# Patient Record
Sex: Female | Born: 1935 | Race: White | Hispanic: No | Smoking: Current every day smoker
Health system: Southern US, Community
[De-identification: ages and names within clinical notes are randomized; demographics above are authoritative.]

## PROBLEM LIST (undated history)

## (undated) DIAGNOSIS — J449 Chronic obstructive pulmonary disease, unspecified: Secondary | ICD-10-CM

## (undated) DIAGNOSIS — D529 Folate deficiency anemia, unspecified: Secondary | ICD-10-CM

## (undated) DIAGNOSIS — J383 Other diseases of vocal cords: Secondary | ICD-10-CM

## (undated) DIAGNOSIS — Z9981 Dependence on supplemental oxygen: Secondary | ICD-10-CM

## (undated) DIAGNOSIS — I6529 Occlusion and stenosis of unspecified carotid artery: Secondary | ICD-10-CM

## (undated) DIAGNOSIS — F410 Panic disorder [episodic paroxysmal anxiety] without agoraphobia: Secondary | ICD-10-CM

## (undated) DIAGNOSIS — G63 Polyneuropathy in diseases classified elsewhere: Secondary | ICD-10-CM

## (undated) DIAGNOSIS — N289 Disorder of kidney and ureter, unspecified: Secondary | ICD-10-CM

## (undated) DIAGNOSIS — I10 Essential (primary) hypertension: Secondary | ICD-10-CM

## (undated) DIAGNOSIS — G40909 Epilepsy, unspecified, not intractable, without status epilepticus: Secondary | ICD-10-CM

## (undated) DIAGNOSIS — K635 Polyp of colon: Secondary | ICD-10-CM

## (undated) DIAGNOSIS — S32501A Unspecified fracture of right pubis, initial encounter for closed fracture: Secondary | ICD-10-CM

## (undated) DIAGNOSIS — K219 Gastro-esophageal reflux disease without esophagitis: Secondary | ICD-10-CM

## (undated) DIAGNOSIS — N63 Unspecified lump in unspecified breast: Secondary | ICD-10-CM

## (undated) DIAGNOSIS — L259 Unspecified contact dermatitis, unspecified cause: Secondary | ICD-10-CM

## (undated) DIAGNOSIS — R7402 Elevation of levels of lactic acid dehydrogenase (LDH): Secondary | ICD-10-CM

## (undated) DIAGNOSIS — R740 Nonspecific elevation of levels of transaminase and lactic acid dehydrogenase [LDH]: Secondary | ICD-10-CM

## (undated) DIAGNOSIS — R7401 Elevation of levels of liver transaminase levels: Secondary | ICD-10-CM

## (undated) DIAGNOSIS — E785 Hyperlipidemia, unspecified: Secondary | ICD-10-CM

## (undated) HISTORY — DX: Chronic obstructive pulmonary disease, unspecified: J44.9

## (undated) HISTORY — DX: Occlusion and stenosis of unspecified carotid artery: I65.29

## (undated) HISTORY — DX: Unspecified lump in unspecified breast: N63.0

## (undated) HISTORY — DX: Unspecified contact dermatitis, unspecified cause: L25.9

## (undated) HISTORY — DX: Disorder of kidney and ureter, unspecified: N28.9

## (undated) HISTORY — DX: Panic disorder (episodic paroxysmal anxiety): F41.0

## (undated) HISTORY — PX: TUBAL LIGATION: SHX77

## (undated) HISTORY — DX: Essential (primary) hypertension: I10

## (undated) HISTORY — DX: Polyp of colon: K63.5

## (undated) HISTORY — PX: APPENDECTOMY: SHX54

## (undated) HISTORY — DX: Nonspecific elevation of levels of transaminase and lactic acid dehydrogenase (ldh): R74.0

## (undated) HISTORY — DX: Elevation of levels of lactic acid dehydrogenase (LDH): R74.02

## (undated) HISTORY — PX: COLONOSCOPY: SHX174

## (undated) HISTORY — DX: Hyperlipidemia, unspecified: E78.5

## (undated) HISTORY — DX: Polyneuropathy in diseases classified elsewhere: G63

## (undated) HISTORY — PX: BREAST BIOPSY: SHX20

## (undated) HISTORY — DX: Elevation of levels of liver transaminase levels: R74.01

## (undated) HISTORY — DX: Dependence on supplemental oxygen: Z99.81

## (undated) HISTORY — DX: Gastro-esophageal reflux disease without esophagitis: K21.9

## (undated) HISTORY — PX: UPPER GASTROINTESTINAL ENDOSCOPY: SHX188

## (undated) HISTORY — DX: Epilepsy, unspecified, not intractable, without status epilepticus: G40.909

## (undated) HISTORY — DX: Unspecified fracture of right pubis, initial encounter for closed fracture: S32.501A

## (undated) HISTORY — DX: Other diseases of vocal cords: J38.3

## (undated) HISTORY — DX: Folate deficiency anemia, unspecified: D52.9

## (undated) HISTORY — PX: CHOLECYSTECTOMY: SHX55

---

## 1996-01-12 DIAGNOSIS — J449 Chronic obstructive pulmonary disease, unspecified: Secondary | ICD-10-CM

## 1997-11-12 ENCOUNTER — Encounter: Payer: Self-pay | Admitting: Family Medicine

## 1997-11-12 ENCOUNTER — Ambulatory Visit (HOSPITAL_COMMUNITY): Admission: RE | Admit: 1997-11-12 | Discharge: 1997-11-12 | Payer: Self-pay | Admitting: Family Medicine

## 1999-01-23 ENCOUNTER — Encounter: Payer: Self-pay | Admitting: Family Medicine

## 1999-01-23 ENCOUNTER — Ambulatory Visit (HOSPITAL_COMMUNITY): Admission: RE | Admit: 1999-01-23 | Discharge: 1999-01-23 | Payer: Self-pay | Admitting: Family Medicine

## 1999-01-25 ENCOUNTER — Ambulatory Visit (HOSPITAL_COMMUNITY): Admission: RE | Admit: 1999-01-25 | Discharge: 1999-01-25 | Payer: Self-pay | Admitting: Family Medicine

## 1999-01-25 ENCOUNTER — Encounter: Payer: Self-pay | Admitting: Family Medicine

## 1999-03-20 ENCOUNTER — Emergency Department (HOSPITAL_COMMUNITY): Admission: EM | Admit: 1999-03-20 | Discharge: 1999-03-21 | Payer: Self-pay | Admitting: *Deleted

## 1999-03-26 ENCOUNTER — Ambulatory Visit (HOSPITAL_COMMUNITY): Admission: RE | Admit: 1999-03-26 | Discharge: 1999-03-26 | Payer: Self-pay | Admitting: Pediatrics

## 1999-03-31 ENCOUNTER — Ambulatory Visit (HOSPITAL_COMMUNITY): Admission: RE | Admit: 1999-03-31 | Discharge: 1999-03-31 | Payer: Self-pay | Admitting: Pediatrics

## 1999-03-31 ENCOUNTER — Encounter: Payer: Self-pay | Admitting: Pediatrics

## 1999-04-01 ENCOUNTER — Encounter: Payer: Self-pay | Admitting: Emergency Medicine

## 1999-04-01 ENCOUNTER — Emergency Department (HOSPITAL_COMMUNITY): Admission: EM | Admit: 1999-04-01 | Discharge: 1999-04-01 | Payer: Self-pay | Admitting: Emergency Medicine

## 1999-04-02 ENCOUNTER — Ambulatory Visit (HOSPITAL_COMMUNITY): Admission: RE | Admit: 1999-04-02 | Discharge: 1999-04-02 | Payer: Self-pay | Admitting: Pediatrics

## 1999-07-31 ENCOUNTER — Other Ambulatory Visit: Admission: RE | Admit: 1999-07-31 | Discharge: 1999-07-31 | Payer: Self-pay | Admitting: Family Medicine

## 2000-06-11 DIAGNOSIS — M810 Age-related osteoporosis without current pathological fracture: Secondary | ICD-10-CM | POA: Insufficient documentation

## 2000-06-14 ENCOUNTER — Encounter: Admission: RE | Admit: 2000-06-14 | Discharge: 2000-06-14 | Payer: Self-pay | Admitting: Family Medicine

## 2000-06-14 ENCOUNTER — Encounter: Payer: Self-pay | Admitting: Family Medicine

## 2002-07-18 ENCOUNTER — Other Ambulatory Visit: Admission: RE | Admit: 2002-07-18 | Discharge: 2002-07-18 | Payer: Self-pay | Admitting: Family Medicine

## 2003-03-19 ENCOUNTER — Ambulatory Visit (HOSPITAL_COMMUNITY): Admission: RE | Admit: 2003-03-19 | Discharge: 2003-03-19 | Payer: Self-pay | Admitting: Family Medicine

## 2003-06-06 ENCOUNTER — Encounter (INDEPENDENT_AMBULATORY_CARE_PROVIDER_SITE_OTHER): Payer: Self-pay | Admitting: Family Medicine

## 2003-08-14 ENCOUNTER — Other Ambulatory Visit: Admission: RE | Admit: 2003-08-14 | Discharge: 2003-08-14 | Payer: Self-pay | Admitting: Family Medicine

## 2003-11-07 ENCOUNTER — Ambulatory Visit: Payer: Self-pay | Admitting: Internal Medicine

## 2003-11-11 ENCOUNTER — Ambulatory Visit (HOSPITAL_COMMUNITY): Admission: RE | Admit: 2003-11-11 | Discharge: 2003-11-11 | Payer: Self-pay | Admitting: Internal Medicine

## 2003-11-14 ENCOUNTER — Ambulatory Visit: Payer: Self-pay | Admitting: Internal Medicine

## 2003-11-14 ENCOUNTER — Ambulatory Visit: Payer: Self-pay | Admitting: Critical Care Medicine

## 2003-11-21 ENCOUNTER — Ambulatory Visit: Payer: Self-pay | Admitting: Family Medicine

## 2003-11-27 ENCOUNTER — Ambulatory Visit: Payer: Self-pay | Admitting: Family Medicine

## 2003-12-03 ENCOUNTER — Encounter (INDEPENDENT_AMBULATORY_CARE_PROVIDER_SITE_OTHER): Payer: Self-pay | Admitting: *Deleted

## 2003-12-03 ENCOUNTER — Ambulatory Visit (HOSPITAL_COMMUNITY): Admission: RE | Admit: 2003-12-03 | Discharge: 2003-12-04 | Payer: Self-pay | Admitting: General Surgery

## 2004-04-10 ENCOUNTER — Ambulatory Visit: Payer: Self-pay | Admitting: Critical Care Medicine

## 2004-04-28 ENCOUNTER — Ambulatory Visit: Payer: Self-pay | Admitting: Critical Care Medicine

## 2004-04-30 ENCOUNTER — Ambulatory Visit: Payer: Self-pay | Admitting: Internal Medicine

## 2004-07-09 ENCOUNTER — Ambulatory Visit: Payer: Self-pay | Admitting: Critical Care Medicine

## 2004-10-06 ENCOUNTER — Other Ambulatory Visit: Admission: RE | Admit: 2004-10-06 | Discharge: 2004-10-06 | Payer: Self-pay | Admitting: Family Medicine

## 2004-10-06 ENCOUNTER — Ambulatory Visit: Payer: Self-pay | Admitting: Family Medicine

## 2004-10-21 ENCOUNTER — Encounter: Admission: RE | Admit: 2004-10-21 | Discharge: 2004-10-21 | Payer: Self-pay | Admitting: Family Medicine

## 2004-10-23 ENCOUNTER — Ambulatory Visit: Payer: Self-pay | Admitting: Family Medicine

## 2004-10-28 ENCOUNTER — Ambulatory Visit: Payer: Self-pay | Admitting: Critical Care Medicine

## 2004-11-09 ENCOUNTER — Ambulatory Visit: Payer: Self-pay | Admitting: Family Medicine

## 2004-11-17 ENCOUNTER — Ambulatory Visit: Payer: Self-pay | Admitting: Family Medicine

## 2004-12-08 ENCOUNTER — Ambulatory Visit: Payer: Self-pay | Admitting: Family Medicine

## 2005-01-26 ENCOUNTER — Ambulatory Visit: Payer: Self-pay | Admitting: Family Medicine

## 2005-02-04 ENCOUNTER — Ambulatory Visit (HOSPITAL_COMMUNITY): Admission: RE | Admit: 2005-02-04 | Discharge: 2005-02-04 | Payer: Self-pay | Admitting: Family Medicine

## 2005-02-19 ENCOUNTER — Ambulatory Visit: Payer: Self-pay | Admitting: Internal Medicine

## 2005-03-05 ENCOUNTER — Ambulatory Visit: Payer: Self-pay | Admitting: Internal Medicine

## 2005-03-05 ENCOUNTER — Ambulatory Visit (HOSPITAL_COMMUNITY): Admission: RE | Admit: 2005-03-05 | Discharge: 2005-03-05 | Payer: Self-pay | Admitting: Internal Medicine

## 2005-03-05 ENCOUNTER — Encounter (INDEPENDENT_AMBULATORY_CARE_PROVIDER_SITE_OTHER): Payer: Self-pay | Admitting: Specialist

## 2005-03-09 ENCOUNTER — Ambulatory Visit: Payer: Self-pay | Admitting: Critical Care Medicine

## 2005-04-29 ENCOUNTER — Ambulatory Visit: Payer: Self-pay | Admitting: Internal Medicine

## 2005-05-04 ENCOUNTER — Ambulatory Visit: Payer: Self-pay | Admitting: Gastroenterology

## 2005-05-27 ENCOUNTER — Ambulatory Visit: Payer: Self-pay | Admitting: Internal Medicine

## 2005-06-10 ENCOUNTER — Encounter: Payer: Self-pay | Admitting: Internal Medicine

## 2005-06-10 ENCOUNTER — Ambulatory Visit: Payer: Self-pay | Admitting: Internal Medicine

## 2005-06-21 ENCOUNTER — Ambulatory Visit: Payer: Self-pay | Admitting: Family Medicine

## 2005-07-01 ENCOUNTER — Ambulatory Visit: Payer: Self-pay | Admitting: Internal Medicine

## 2005-07-01 ENCOUNTER — Ambulatory Visit: Payer: Self-pay | Admitting: Critical Care Medicine

## 2005-10-04 ENCOUNTER — Encounter (INDEPENDENT_AMBULATORY_CARE_PROVIDER_SITE_OTHER): Payer: Self-pay | Admitting: Family Medicine

## 2005-10-04 ENCOUNTER — Other Ambulatory Visit: Admission: RE | Admit: 2005-10-04 | Discharge: 2005-10-04 | Payer: Self-pay | Admitting: *Deleted

## 2005-10-04 ENCOUNTER — Ambulatory Visit: Payer: Self-pay | Admitting: Internal Medicine

## 2005-10-04 LAB — CONVERTED CEMR LAB: Pap Smear: NORMAL

## 2005-11-09 ENCOUNTER — Ambulatory Visit (HOSPITAL_COMMUNITY): Admission: RE | Admit: 2005-11-09 | Discharge: 2005-11-09 | Payer: Self-pay | Admitting: Critical Care Medicine

## 2005-11-09 ENCOUNTER — Ambulatory Visit: Payer: Self-pay | Admitting: Critical Care Medicine

## 2006-03-15 ENCOUNTER — Ambulatory Visit: Payer: Self-pay | Admitting: Family Medicine

## 2006-03-24 ENCOUNTER — Encounter (INDEPENDENT_AMBULATORY_CARE_PROVIDER_SITE_OTHER): Payer: Self-pay | Admitting: Family Medicine

## 2006-06-14 ENCOUNTER — Ambulatory Visit: Payer: Self-pay | Admitting: Family Medicine

## 2006-07-19 ENCOUNTER — Ambulatory Visit: Payer: Self-pay | Admitting: Critical Care Medicine

## 2006-07-19 ENCOUNTER — Ambulatory Visit: Payer: Self-pay | Admitting: Family Medicine

## 2006-08-16 ENCOUNTER — Ambulatory Visit: Payer: Self-pay | Admitting: Family Medicine

## 2006-08-16 LAB — CONVERTED CEMR LAB
Basophils Absolute: 0.1 10*3/uL (ref 0.0–0.1)
Basophils Relative: 1 % (ref 0–1)
Eosinophils Absolute: 0.1 10*3/uL (ref 0.0–0.7)
Eosinophils Relative: 1 % (ref 0–5)
Folate: 3.7 ng/mL
HCT: 45.3 % (ref 36.0–46.0)
Hemoglobin: 14.3 g/dL (ref 12.0–15.0)
Lymphocytes Relative: 24 % (ref 12–46)
Lymphs Abs: 3 10*3/uL (ref 0.7–3.3)
MCHC: 31.6 g/dL (ref 30.0–36.0)
MCV: 101.1 fL — ABNORMAL HIGH (ref 78.0–100.0)
Monocytes Absolute: 0.7 10*3/uL (ref 0.2–0.7)
Monocytes Relative: 6 % (ref 3–11)
Neutro Abs: 8.4 10*3/uL — ABNORMAL HIGH (ref 1.7–7.7)
Neutrophils Relative %: 69 % (ref 43–77)
Platelets: 286 10*3/uL (ref 150–400)
RBC: 4.48 M/uL (ref 3.87–5.11)
RDW: 15 % — ABNORMAL HIGH (ref 11.5–14.0)
Vitamin B-12: 493 pg/mL (ref 211–911)
WBC: 12.2 10*3/uL — ABNORMAL HIGH (ref 4.0–10.5)

## 2006-08-22 DIAGNOSIS — D529 Folate deficiency anemia, unspecified: Secondary | ICD-10-CM | POA: Insufficient documentation

## 2006-09-01 ENCOUNTER — Encounter (INDEPENDENT_AMBULATORY_CARE_PROVIDER_SITE_OTHER): Payer: Self-pay | Admitting: Family Medicine

## 2006-09-01 DIAGNOSIS — F410 Panic disorder [episodic paroxysmal anxiety] without agoraphobia: Secondary | ICD-10-CM | POA: Insufficient documentation

## 2006-09-01 DIAGNOSIS — J383 Other diseases of vocal cords: Secondary | ICD-10-CM

## 2006-09-01 DIAGNOSIS — R569 Unspecified convulsions: Secondary | ICD-10-CM

## 2006-09-01 DIAGNOSIS — F4310 Post-traumatic stress disorder, unspecified: Secondary | ICD-10-CM

## 2006-09-01 DIAGNOSIS — F17200 Nicotine dependence, unspecified, uncomplicated: Secondary | ICD-10-CM | POA: Insufficient documentation

## 2006-10-04 ENCOUNTER — Telehealth (INDEPENDENT_AMBULATORY_CARE_PROVIDER_SITE_OTHER): Payer: Self-pay | Admitting: *Deleted

## 2006-10-10 ENCOUNTER — Telehealth (INDEPENDENT_AMBULATORY_CARE_PROVIDER_SITE_OTHER): Payer: Self-pay | Admitting: *Deleted

## 2006-10-19 ENCOUNTER — Telehealth (INDEPENDENT_AMBULATORY_CARE_PROVIDER_SITE_OTHER): Payer: Self-pay | Admitting: *Deleted

## 2006-10-21 ENCOUNTER — Telehealth (INDEPENDENT_AMBULATORY_CARE_PROVIDER_SITE_OTHER): Payer: Self-pay | Admitting: Internal Medicine

## 2006-10-27 ENCOUNTER — Ambulatory Visit: Payer: Self-pay | Admitting: Pulmonary Disease

## 2006-10-27 DIAGNOSIS — K219 Gastro-esophageal reflux disease without esophagitis: Secondary | ICD-10-CM | POA: Insufficient documentation

## 2006-11-07 ENCOUNTER — Telehealth (INDEPENDENT_AMBULATORY_CARE_PROVIDER_SITE_OTHER): Payer: Self-pay | Admitting: *Deleted

## 2007-02-21 ENCOUNTER — Ambulatory Visit: Payer: Self-pay | Admitting: Critical Care Medicine

## 2007-02-21 ENCOUNTER — Ambulatory Visit: Payer: Self-pay | Admitting: Pulmonary Disease

## 2007-03-06 ENCOUNTER — Ambulatory Visit: Payer: Self-pay | Admitting: Family Medicine

## 2007-03-06 LAB — CONVERTED CEMR LAB
ALT: 15 units/L (ref 0–35)
AST: 20 units/L (ref 0–37)
Albumin: 3.8 g/dL (ref 3.5–5.2)
Alkaline Phosphatase: 109 units/L (ref 39–117)
BUN: 11 mg/dL (ref 6–23)
CO2: 24 meq/L (ref 19–32)
Calcium: 10 mg/dL (ref 8.4–10.5)
Chloride: 104 meq/L (ref 96–112)
Cholesterol: 177 mg/dL (ref 0–200)
Creatinine, Ser: 1.05 mg/dL (ref 0.40–1.20)
Glucose, Bld: 99 mg/dL (ref 70–99)
HDL: 50 mg/dL (ref >39)
LDL Cholesterol: 102 mg/dL — ABNORMAL HIGH (ref 0–99)
Potassium: 5.4 meq/L — ABNORMAL HIGH (ref 3.5–5.3)
Sodium: 145 meq/L (ref 135–145)
TSH: 2.009 microintl units/mL (ref 0.350–5.50)
Total Bilirubin: 0.9 mg/dL (ref 0.3–1.2)
Total CHOL/HDL Ratio: 3.5
Total Protein: 7.1 g/dL (ref 6.0–8.3)
Triglycerides: 123 mg/dL (ref <150)
VLDL: 25 mg/dL (ref 0–40)

## 2007-03-08 ENCOUNTER — Telehealth (INDEPENDENT_AMBULATORY_CARE_PROVIDER_SITE_OTHER): Payer: Self-pay | Admitting: Family Medicine

## 2007-03-20 ENCOUNTER — Telehealth (INDEPENDENT_AMBULATORY_CARE_PROVIDER_SITE_OTHER): Payer: Self-pay | Admitting: *Deleted

## 2007-04-03 ENCOUNTER — Telehealth (INDEPENDENT_AMBULATORY_CARE_PROVIDER_SITE_OTHER): Payer: Self-pay | Admitting: Family Medicine

## 2007-04-13 ENCOUNTER — Encounter (INDEPENDENT_AMBULATORY_CARE_PROVIDER_SITE_OTHER): Payer: Self-pay | Admitting: Family Medicine

## 2007-04-18 ENCOUNTER — Ambulatory Visit: Payer: Self-pay | Admitting: Family Medicine

## 2007-04-19 ENCOUNTER — Telehealth (INDEPENDENT_AMBULATORY_CARE_PROVIDER_SITE_OTHER): Payer: Self-pay | Admitting: Family Medicine

## 2007-04-20 ENCOUNTER — Encounter (INDEPENDENT_AMBULATORY_CARE_PROVIDER_SITE_OTHER): Payer: Self-pay | Admitting: Nurse Practitioner

## 2007-04-24 ENCOUNTER — Telehealth (INDEPENDENT_AMBULATORY_CARE_PROVIDER_SITE_OTHER): Payer: Self-pay | Admitting: Family Medicine

## 2007-05-29 ENCOUNTER — Telehealth (INDEPENDENT_AMBULATORY_CARE_PROVIDER_SITE_OTHER): Payer: Self-pay | Admitting: Nurse Practitioner

## 2007-05-29 ENCOUNTER — Telehealth (INDEPENDENT_AMBULATORY_CARE_PROVIDER_SITE_OTHER): Payer: Self-pay | Admitting: *Deleted

## 2007-05-31 ENCOUNTER — Telehealth (INDEPENDENT_AMBULATORY_CARE_PROVIDER_SITE_OTHER): Payer: Self-pay | Admitting: Family Medicine

## 2007-06-14 ENCOUNTER — Telehealth (INDEPENDENT_AMBULATORY_CARE_PROVIDER_SITE_OTHER): Payer: Self-pay | Admitting: Family Medicine

## 2007-06-19 ENCOUNTER — Ambulatory Visit: Payer: Self-pay | Admitting: Family Medicine

## 2007-07-13 ENCOUNTER — Telehealth (INDEPENDENT_AMBULATORY_CARE_PROVIDER_SITE_OTHER): Payer: Self-pay | Admitting: Family Medicine

## 2007-07-31 ENCOUNTER — Ambulatory Visit: Payer: Self-pay | Admitting: Family Medicine

## 2007-07-31 ENCOUNTER — Encounter (INDEPENDENT_AMBULATORY_CARE_PROVIDER_SITE_OTHER): Payer: Self-pay | Admitting: Family Medicine

## 2007-07-31 DIAGNOSIS — N63 Unspecified lump in unspecified breast: Secondary | ICD-10-CM | POA: Insufficient documentation

## 2007-08-01 LAB — CONVERTED CEMR LAB: Pap Smear: NORMAL

## 2007-08-04 ENCOUNTER — Encounter: Admission: RE | Admit: 2007-08-04 | Discharge: 2007-08-04 | Payer: Self-pay | Admitting: Family Medicine

## 2007-08-07 ENCOUNTER — Telehealth (INDEPENDENT_AMBULATORY_CARE_PROVIDER_SITE_OTHER): Payer: Self-pay | Admitting: *Deleted

## 2007-08-08 ENCOUNTER — Encounter (INDEPENDENT_AMBULATORY_CARE_PROVIDER_SITE_OTHER): Payer: Self-pay | Admitting: Family Medicine

## 2007-08-10 ENCOUNTER — Encounter (INDEPENDENT_AMBULATORY_CARE_PROVIDER_SITE_OTHER): Payer: Self-pay | Admitting: Family Medicine

## 2007-08-17 ENCOUNTER — Telehealth (INDEPENDENT_AMBULATORY_CARE_PROVIDER_SITE_OTHER): Payer: Self-pay | Admitting: Family Medicine

## 2007-08-23 ENCOUNTER — Ambulatory Visit: Payer: Self-pay | Admitting: Critical Care Medicine

## 2007-08-25 ENCOUNTER — Encounter (INDEPENDENT_AMBULATORY_CARE_PROVIDER_SITE_OTHER): Payer: Self-pay | Admitting: Family Medicine

## 2007-09-01 ENCOUNTER — Telehealth (INDEPENDENT_AMBULATORY_CARE_PROVIDER_SITE_OTHER): Payer: Self-pay | Admitting: Family Medicine

## 2007-09-12 ENCOUNTER — Telehealth (INDEPENDENT_AMBULATORY_CARE_PROVIDER_SITE_OTHER): Payer: Self-pay | Admitting: *Deleted

## 2007-09-25 ENCOUNTER — Ambulatory Visit: Payer: Self-pay | Admitting: Family Medicine

## 2007-09-25 DIAGNOSIS — G63 Polyneuropathy in diseases classified elsewhere: Secondary | ICD-10-CM

## 2007-09-28 ENCOUNTER — Ambulatory Visit: Payer: Self-pay | Admitting: Critical Care Medicine

## 2007-09-29 ENCOUNTER — Telehealth (INDEPENDENT_AMBULATORY_CARE_PROVIDER_SITE_OTHER): Payer: Self-pay | Admitting: *Deleted

## 2007-10-12 ENCOUNTER — Encounter (INDEPENDENT_AMBULATORY_CARE_PROVIDER_SITE_OTHER): Payer: Self-pay | Admitting: Family Medicine

## 2007-12-11 ENCOUNTER — Ambulatory Visit: Payer: Self-pay | Admitting: Critical Care Medicine

## 2007-12-15 ENCOUNTER — Telehealth (INDEPENDENT_AMBULATORY_CARE_PROVIDER_SITE_OTHER): Payer: Self-pay | Admitting: *Deleted

## 2007-12-20 ENCOUNTER — Encounter (INDEPENDENT_AMBULATORY_CARE_PROVIDER_SITE_OTHER): Payer: Self-pay | Admitting: Family Medicine

## 2007-12-21 ENCOUNTER — Telehealth (INDEPENDENT_AMBULATORY_CARE_PROVIDER_SITE_OTHER): Payer: Self-pay | Admitting: *Deleted

## 2007-12-22 ENCOUNTER — Telehealth (INDEPENDENT_AMBULATORY_CARE_PROVIDER_SITE_OTHER): Payer: Self-pay | Admitting: *Deleted

## 2007-12-25 ENCOUNTER — Ambulatory Visit: Payer: Self-pay | Admitting: Family Medicine

## 2007-12-26 ENCOUNTER — Encounter (INDEPENDENT_AMBULATORY_CARE_PROVIDER_SITE_OTHER): Payer: Self-pay | Admitting: Family Medicine

## 2007-12-26 ENCOUNTER — Ambulatory Visit: Payer: Self-pay | Admitting: Critical Care Medicine

## 2008-01-03 ENCOUNTER — Encounter: Payer: Self-pay | Admitting: Critical Care Medicine

## 2008-01-03 LAB — CONVERTED CEMR LAB
RBC Folate: 1614 ng/mL — ABNORMAL HIGH (ref 180–600)
Vitamin B-12: 928 pg/mL — ABNORMAL HIGH (ref 211–911)

## 2008-02-28 ENCOUNTER — Encounter (INDEPENDENT_AMBULATORY_CARE_PROVIDER_SITE_OTHER): Payer: Self-pay | Admitting: Family Medicine

## 2008-03-15 ENCOUNTER — Ambulatory Visit: Payer: Self-pay | Admitting: Critical Care Medicine

## 2008-03-15 ENCOUNTER — Telehealth: Payer: Self-pay | Admitting: Critical Care Medicine

## 2008-04-15 ENCOUNTER — Telehealth (INDEPENDENT_AMBULATORY_CARE_PROVIDER_SITE_OTHER): Payer: Self-pay | Admitting: *Deleted

## 2008-04-16 ENCOUNTER — Encounter (INDEPENDENT_AMBULATORY_CARE_PROVIDER_SITE_OTHER): Payer: Self-pay | Admitting: Family Medicine

## 2008-04-25 ENCOUNTER — Telehealth (INDEPENDENT_AMBULATORY_CARE_PROVIDER_SITE_OTHER): Payer: Self-pay | Admitting: *Deleted

## 2008-05-22 ENCOUNTER — Telehealth (INDEPENDENT_AMBULATORY_CARE_PROVIDER_SITE_OTHER): Payer: Self-pay | Admitting: Family Medicine

## 2008-05-30 ENCOUNTER — Ambulatory Visit: Payer: Self-pay | Admitting: Family Medicine

## 2008-06-04 LAB — CONVERTED CEMR LAB
ALT: 11 units/L (ref 0–35)
AST: 20 units/L (ref 0–37)
Albumin: 4.1 g/dL (ref 3.5–5.2)
Alkaline Phosphatase: 74 units/L (ref 39–117)
BUN: 14 mg/dL (ref 6–23)
Basophils Absolute: 0.1 10*3/uL (ref 0.0–0.1)
Basophils Relative: 1 % (ref 0–1)
CO2: 26 meq/L (ref 19–32)
Calcium: 10.1 mg/dL (ref 8.4–10.5)
Chloride: 104 meq/L (ref 96–112)
Cholesterol: 191 mg/dL (ref 0–200)
Creatinine, Ser: 1.17 mg/dL (ref 0.40–1.20)
Eosinophils Absolute: 0.2 10*3/uL (ref 0.0–0.7)
Eosinophils Relative: 2 % (ref 0–5)
Glucose, Bld: 79 mg/dL (ref 70–99)
HCT: 44 % (ref 36.0–46.0)
HDL: 52 mg/dL (ref >39)
Hemoglobin: 14.9 g/dL (ref 12.0–15.0)
LDL Cholesterol: 116 mg/dL — ABNORMAL HIGH (ref 0–99)
Lymphocytes Relative: 27 % (ref 12–46)
Lymphs Abs: 3.2 10*3/uL (ref 0.7–4.0)
MCHC: 33.9 g/dL (ref 30.0–36.0)
MCV: 97.3 fL (ref 78.0–100.0)
Monocytes Absolute: 0.9 10*3/uL (ref 0.1–1.0)
Monocytes Relative: 8 % (ref 3–12)
Neutro Abs: 7.6 10*3/uL (ref 1.7–7.7)
Neutrophils Relative %: 63 % (ref 43–77)
Platelets: 254 10*3/uL (ref 150–400)
Potassium: 4.8 meq/L (ref 3.5–5.3)
RBC: 4.52 M/uL (ref 3.87–5.11)
RDW: 14.6 % (ref 11.5–15.5)
Sodium: 142 meq/L (ref 135–145)
Total Bilirubin: 0.5 mg/dL (ref 0.3–1.2)
Total CHOL/HDL Ratio: 3.7
Total Protein: 7.7 g/dL (ref 6.0–8.3)
Triglycerides: 113 mg/dL (ref <150)
VLDL: 23 mg/dL (ref 0–40)
Vitamin B-12: 735 pg/mL (ref 211–911)
WBC: 12.1 10*3/uL — ABNORMAL HIGH (ref 4.0–10.5)

## 2008-06-25 ENCOUNTER — Ambulatory Visit: Payer: Self-pay | Admitting: Family Medicine

## 2008-06-25 LAB — CONVERTED CEMR LAB: RBC Folate: 1024 ng/mL — ABNORMAL HIGH (ref 180–600)

## 2008-06-27 ENCOUNTER — Encounter (INDEPENDENT_AMBULATORY_CARE_PROVIDER_SITE_OTHER): Payer: Self-pay | Admitting: Internal Medicine

## 2008-07-22 ENCOUNTER — Ambulatory Visit: Payer: Self-pay | Admitting: Physician Assistant

## 2008-07-22 DIAGNOSIS — R269 Unspecified abnormalities of gait and mobility: Secondary | ICD-10-CM

## 2008-07-22 DIAGNOSIS — R55 Syncope and collapse: Secondary | ICD-10-CM

## 2008-07-23 ENCOUNTER — Encounter: Payer: Self-pay | Admitting: Physician Assistant

## 2008-07-23 LAB — CONVERTED CEMR LAB
ALT: 14 units/L
AST: 23 units/L
Albumin: 4.4 g/dL
Alkaline Phosphatase: 93 units/L
BUN: 9 mg/dL
Basophils Relative: 0 %
CO2: 24 meq/L
Calcium: 10.7 mg/dL
Chloride: 103 meq/L
Creatinine, Ser: 1.14 mg/dL
Eosinophils Relative: 2 %
Glucose, Bld: 88 mg/dL
HCT: 43.6 %
Hemoglobin: 14.2 g/dL
Lymphocytes Relative: 25 %
MCHC: 32.6 g/dL
MCV: 99.3 fL
Monocytes Relative: 7 %
Neutrophils Relative %: 66 %
Platelets: 266 10*3/uL
Potassium: 4.9 meq/L
RBC: 4.39 M/uL
RDW: 14.6 %
Sodium: 144 meq/L
TSH: 1.815 microintl units/mL
Total Bilirubin: 5 mg/dL
Total Protein: 8 g/dL
WBC: 11.3 10*3/uL

## 2008-07-25 ENCOUNTER — Ambulatory Visit (HOSPITAL_COMMUNITY): Admission: RE | Admit: 2008-07-25 | Discharge: 2008-07-25 | Payer: Self-pay | Admitting: Internal Medicine

## 2008-07-26 ENCOUNTER — Encounter: Payer: Self-pay | Admitting: Physician Assistant

## 2008-07-30 ENCOUNTER — Ambulatory Visit: Payer: Self-pay | Admitting: Critical Care Medicine

## 2008-07-31 ENCOUNTER — Ambulatory Visit: Payer: Self-pay | Admitting: Internal Medicine

## 2008-07-31 ENCOUNTER — Encounter (INDEPENDENT_AMBULATORY_CARE_PROVIDER_SITE_OTHER): Payer: Self-pay | Admitting: Internal Medicine

## 2008-07-31 ENCOUNTER — Ambulatory Visit (HOSPITAL_COMMUNITY): Admission: RE | Admit: 2008-07-31 | Discharge: 2008-07-31 | Payer: Self-pay | Admitting: Internal Medicine

## 2008-08-02 ENCOUNTER — Encounter: Payer: Self-pay | Admitting: Physician Assistant

## 2008-08-19 ENCOUNTER — Encounter: Admission: RE | Admit: 2008-08-19 | Discharge: 2008-10-14 | Payer: Self-pay | Admitting: Physician Assistant

## 2008-08-19 ENCOUNTER — Telehealth: Payer: Self-pay | Admitting: Physician Assistant

## 2008-08-26 ENCOUNTER — Ambulatory Visit: Payer: Self-pay | Admitting: Physician Assistant

## 2008-08-26 DIAGNOSIS — L259 Unspecified contact dermatitis, unspecified cause: Secondary | ICD-10-CM

## 2008-08-26 LAB — CONVERTED CEMR LAB: Vit D, 25-Hydroxy: 30 ng/mL (ref 30–89)

## 2008-08-27 ENCOUNTER — Telehealth: Payer: Self-pay | Admitting: Physician Assistant

## 2008-09-02 ENCOUNTER — Ambulatory Visit (HOSPITAL_COMMUNITY): Admission: RE | Admit: 2008-09-02 | Discharge: 2008-09-02 | Payer: Self-pay | Admitting: Internal Medicine

## 2008-09-02 ENCOUNTER — Encounter: Payer: Self-pay | Admitting: Physician Assistant

## 2008-09-04 ENCOUNTER — Ambulatory Visit: Payer: Self-pay | Admitting: Nurse Practitioner

## 2008-09-10 ENCOUNTER — Encounter: Admission: RE | Admit: 2008-09-10 | Discharge: 2008-12-09 | Payer: Self-pay | Admitting: Critical Care Medicine

## 2008-09-10 ENCOUNTER — Encounter: Payer: Self-pay | Admitting: Physician Assistant

## 2008-09-20 ENCOUNTER — Ambulatory Visit: Payer: Self-pay | Admitting: Nurse Practitioner

## 2008-09-20 ENCOUNTER — Telehealth: Payer: Self-pay | Admitting: Physician Assistant

## 2008-09-20 ENCOUNTER — Encounter: Payer: Self-pay | Admitting: Physician Assistant

## 2008-10-14 ENCOUNTER — Ambulatory Visit: Payer: Self-pay | Admitting: Physician Assistant

## 2008-11-01 ENCOUNTER — Ambulatory Visit: Payer: Self-pay | Admitting: Physician Assistant

## 2008-11-15 ENCOUNTER — Telehealth: Payer: Self-pay | Admitting: Physician Assistant

## 2008-11-15 ENCOUNTER — Ambulatory Visit: Payer: Self-pay | Admitting: Physician Assistant

## 2008-11-22 ENCOUNTER — Ambulatory Visit: Payer: Self-pay | Admitting: Critical Care Medicine

## 2008-11-28 ENCOUNTER — Ambulatory Visit: Payer: Self-pay | Admitting: Physician Assistant

## 2009-01-07 ENCOUNTER — Inpatient Hospital Stay (HOSPITAL_COMMUNITY): Admission: EM | Admit: 2009-01-07 | Discharge: 2009-01-11 | Payer: Self-pay | Admitting: Emergency Medicine

## 2009-01-07 ENCOUNTER — Ambulatory Visit: Payer: Self-pay | Admitting: Cardiovascular Disease

## 2009-01-07 ENCOUNTER — Telehealth: Payer: Self-pay | Admitting: Physician Assistant

## 2009-01-08 ENCOUNTER — Encounter (INDEPENDENT_AMBULATORY_CARE_PROVIDER_SITE_OTHER): Payer: Self-pay | Admitting: Internal Medicine

## 2009-01-08 ENCOUNTER — Ambulatory Visit: Payer: Self-pay | Admitting: Vascular Surgery

## 2009-01-13 ENCOUNTER — Encounter: Payer: Self-pay | Admitting: Physician Assistant

## 2009-01-17 ENCOUNTER — Encounter: Payer: Self-pay | Admitting: Physician Assistant

## 2009-01-25 ENCOUNTER — Encounter: Payer: Self-pay | Admitting: Physician Assistant

## 2009-01-25 ENCOUNTER — Telehealth: Payer: Self-pay | Admitting: Physician Assistant

## 2009-01-25 DIAGNOSIS — D649 Anemia, unspecified: Secondary | ICD-10-CM | POA: Insufficient documentation

## 2009-01-25 DIAGNOSIS — I6529 Occlusion and stenosis of unspecified carotid artery: Secondary | ICD-10-CM | POA: Insufficient documentation

## 2009-02-05 ENCOUNTER — Ambulatory Visit: Payer: Self-pay | Admitting: Physician Assistant

## 2009-02-05 LAB — CONVERTED CEMR LAB
Basophils Absolute: 0 10*3/uL (ref 0.0–0.1)
Basophils Relative: 0 % (ref 0–1)
Eosinophils Absolute: 0.3 10*3/uL (ref 0.0–0.7)
Eosinophils Relative: 3 % (ref 0–5)
HCT: 41.4 % (ref 36.0–46.0)
Hemoglobin: 13.5 g/dL (ref 12.0–15.0)
Lymphocytes Relative: 19 % (ref 12–46)
Lymphs Abs: 2 10*3/uL (ref 0.7–4.0)
MCHC: 32.6 g/dL (ref 30.0–36.0)
MCV: 99.5 fL (ref 78.0–100.0)
Monocytes Absolute: 0.9 10*3/uL (ref 0.1–1.0)
Monocytes Relative: 8 % (ref 3–12)
Neutro Abs: 7.3 10*3/uL (ref 1.7–7.7)
Neutrophils Relative %: 69 % (ref 43–77)
Platelets: 315 10*3/uL (ref 150–400)
RBC: 4.16 M/uL (ref 3.87–5.11)
RDW: 14.8 % (ref 11.5–15.5)
Retic Ct Pct: 1.7 % (ref 0.4–3.1)
WBC: 10.6 10*3/uL — ABNORMAL HIGH (ref 4.0–10.5)

## 2009-02-06 ENCOUNTER — Encounter: Payer: Self-pay | Admitting: Physician Assistant

## 2009-02-07 ENCOUNTER — Ambulatory Visit: Payer: Self-pay | Admitting: Critical Care Medicine

## 2009-02-08 ENCOUNTER — Telehealth: Payer: Self-pay | Admitting: Critical Care Medicine

## 2009-02-11 ENCOUNTER — Telehealth: Payer: Self-pay | Admitting: Physician Assistant

## 2009-02-27 ENCOUNTER — Ambulatory Visit: Payer: Self-pay | Admitting: Physician Assistant

## 2009-02-27 DIAGNOSIS — E785 Hyperlipidemia, unspecified: Secondary | ICD-10-CM

## 2009-02-28 ENCOUNTER — Telehealth: Payer: Self-pay | Admitting: Physician Assistant

## 2009-03-13 ENCOUNTER — Telehealth: Payer: Self-pay | Admitting: Physician Assistant

## 2009-03-27 ENCOUNTER — Ambulatory Visit: Payer: Self-pay | Admitting: Physician Assistant

## 2009-03-28 ENCOUNTER — Encounter: Payer: Self-pay | Admitting: Physician Assistant

## 2009-03-28 LAB — CONVERTED CEMR LAB
ALT: 14 units/L (ref 0–35)
AST: 22 units/L (ref 0–37)
Albumin: 4.2 g/dL (ref 3.5–5.2)
Alkaline Phosphatase: 82 units/L (ref 39–117)
BUN: 13 mg/dL (ref 6–23)
CO2: 25 meq/L (ref 19–32)
Calcium: 10 mg/dL (ref 8.4–10.5)
Chloride: 100 meq/L (ref 96–112)
Creatinine, Ser: 1.4 mg/dL — ABNORMAL HIGH (ref 0.40–1.20)
Glucose, Bld: 95 mg/dL (ref 70–99)
Potassium: 5.1 meq/L (ref 3.5–5.3)
Sodium: 142 meq/L (ref 135–145)
Total Bilirubin: 0.5 mg/dL (ref 0.3–1.2)
Total Protein: 7.4 g/dL (ref 6.0–8.3)

## 2009-04-14 ENCOUNTER — Ambulatory Visit: Payer: Self-pay | Admitting: Physician Assistant

## 2009-04-14 ENCOUNTER — Telehealth (INDEPENDENT_AMBULATORY_CARE_PROVIDER_SITE_OTHER): Payer: Self-pay | Admitting: *Deleted

## 2009-04-14 LAB — CONVERTED CEMR LAB
BUN: 12 mg/dL (ref 6–23)
CO2: 16 meq/L — ABNORMAL LOW (ref 19–32)
Calcium: 10 mg/dL (ref 8.4–10.5)
Chloride: 103 meq/L (ref 96–112)
Creatinine, Ser: 1.22 mg/dL — ABNORMAL HIGH (ref 0.40–1.20)
Glucose, Bld: 93 mg/dL (ref 70–99)
Potassium: 4.5 meq/L (ref 3.5–5.3)
Sodium: 143 meq/L (ref 135–145)

## 2009-04-15 ENCOUNTER — Telehealth: Payer: Self-pay | Admitting: Physician Assistant

## 2009-04-16 ENCOUNTER — Encounter: Payer: Self-pay | Admitting: Cardiology

## 2009-04-16 ENCOUNTER — Telehealth: Payer: Self-pay | Admitting: Physician Assistant

## 2009-04-16 ENCOUNTER — Ambulatory Visit: Payer: Self-pay | Admitting: Physician Assistant

## 2009-04-22 ENCOUNTER — Ambulatory Visit: Payer: Self-pay | Admitting: Critical Care Medicine

## 2009-04-28 ENCOUNTER — Ambulatory Visit: Payer: Self-pay | Admitting: Physician Assistant

## 2009-04-29 LAB — CONVERTED CEMR LAB
ALT: 10 units/L (ref 0–35)
AST: 19 units/L (ref 0–37)
Albumin: 4.2 g/dL (ref 3.5–5.2)
Alkaline Phosphatase: 84 units/L (ref 39–117)
CO2: 21 meq/L (ref 19–32)
Calcium: 10.3 mg/dL (ref 8.4–10.5)
Chloride: 99 meq/L (ref 96–112)
Creatinine, Ser: 1.5 mg/dL — ABNORMAL HIGH (ref 0.40–1.20)
Potassium: 4.2 meq/L (ref 3.5–5.3)
Total Bilirubin: 0.4 mg/dL (ref 0.3–1.2)
Total Protein: 7.3 g/dL (ref 6.0–8.3)

## 2009-05-09 ENCOUNTER — Telehealth: Payer: Self-pay | Admitting: Physician Assistant

## 2009-05-16 DIAGNOSIS — I10 Essential (primary) hypertension: Secondary | ICD-10-CM | POA: Insufficient documentation

## 2009-05-19 ENCOUNTER — Ambulatory Visit: Payer: Self-pay | Admitting: Cardiology

## 2009-05-28 ENCOUNTER — Ambulatory Visit: Payer: Self-pay | Admitting: Physician Assistant

## 2009-05-29 ENCOUNTER — Telehealth (INDEPENDENT_AMBULATORY_CARE_PROVIDER_SITE_OTHER): Payer: Self-pay | Admitting: *Deleted

## 2009-05-29 LAB — CONVERTED CEMR LAB
BUN: 17 mg/dL (ref 6–23)
CO2: 22 meq/L (ref 19–32)
Calcium: 10.2 mg/dL (ref 8.4–10.5)
Chloride: 104 meq/L (ref 96–112)
Cholesterol: 212 mg/dL — ABNORMAL HIGH (ref 0–200)
Creatinine, Ser: 1.42 mg/dL — ABNORMAL HIGH (ref 0.40–1.20)
Glucose, Bld: 95 mg/dL (ref 70–99)
HDL: 54 mg/dL (ref >39)
LDL Cholesterol: 129 mg/dL — ABNORMAL HIGH (ref 0–99)
Potassium: 4.6 meq/L (ref 3.5–5.3)
Sodium: 139 meq/L (ref 135–145)
Total CHOL/HDL Ratio: 3.9
Triglycerides: 143 mg/dL (ref <150)
VLDL: 29 mg/dL (ref 0–40)

## 2009-06-02 ENCOUNTER — Ambulatory Visit: Payer: Self-pay

## 2009-06-02 ENCOUNTER — Encounter (HOSPITAL_COMMUNITY): Admission: RE | Admit: 2009-06-02 | Discharge: 2009-08-13 | Payer: Self-pay | Admitting: Cardiology

## 2009-06-02 ENCOUNTER — Ambulatory Visit: Payer: Self-pay | Admitting: Cardiology

## 2009-06-24 ENCOUNTER — Encounter: Payer: Self-pay | Admitting: Physician Assistant

## 2009-07-06 ENCOUNTER — Encounter: Payer: Self-pay | Admitting: Physician Assistant

## 2009-07-18 ENCOUNTER — Ambulatory Visit: Payer: Self-pay | Admitting: Cardiology

## 2009-07-23 ENCOUNTER — Encounter: Payer: Self-pay | Admitting: Physician Assistant

## 2009-07-23 ENCOUNTER — Ambulatory Visit: Payer: Self-pay | Admitting: Critical Care Medicine

## 2009-07-28 ENCOUNTER — Encounter: Payer: Self-pay | Admitting: Physician Assistant

## 2009-07-29 ENCOUNTER — Ambulatory Visit: Payer: Self-pay | Admitting: Physician Assistant

## 2009-07-29 DIAGNOSIS — M79609 Pain in unspecified limb: Secondary | ICD-10-CM

## 2009-07-30 DIAGNOSIS — N259 Disorder resulting from impaired renal tubular function, unspecified: Secondary | ICD-10-CM | POA: Insufficient documentation

## 2009-07-30 LAB — CONVERTED CEMR LAB
BUN: 25 mg/dL — ABNORMAL HIGH (ref 6–23)
Basophils Absolute: 0.1 10*3/uL (ref 0.0–0.1)
Basophils Relative: 1 % (ref 0–1)
CO2: 25 meq/L (ref 19–32)
Calcium: 9.8 mg/dL (ref 8.4–10.5)
Chloride: 101 meq/L (ref 96–112)
Creatinine, Ser: 1.71 mg/dL — ABNORMAL HIGH (ref 0.40–1.20)
Eosinophils Absolute: 0.3 10*3/uL (ref 0.0–0.7)
Eosinophils Relative: 3 % (ref 0–5)
Ferritin: 171 ng/mL (ref 10–291)
Glucose, Bld: 88 mg/dL (ref 70–99)
HCT: 37.8 % (ref 36.0–46.0)
Hemoglobin: 12.1 g/dL (ref 12.0–15.0)
Lymphocytes Relative: 26 % (ref 12–46)
Lymphs Abs: 2.8 10*3/uL (ref 0.7–4.0)
MCHC: 32 g/dL (ref 30.0–36.0)
MCV: 99.5 fL (ref 78.0–100.0)
Monocytes Absolute: 0.8 10*3/uL (ref 0.1–1.0)
Monocytes Relative: 8 % (ref 3–12)
Neutro Abs: 6.6 10*3/uL (ref 1.7–7.7)
Neutrophils Relative %: 62 % (ref 43–77)
Platelets: 226 10*3/uL (ref 150–400)
Potassium: 5.5 meq/L — ABNORMAL HIGH (ref 3.5–5.3)
RBC: 3.8 M/uL — ABNORMAL LOW (ref 3.87–5.11)
RDW: 14.8 % (ref 11.5–15.5)
Sodium: 137 meq/L (ref 135–145)
TSH: 1.595 microintl units/mL (ref 0.350–4.500)
WBC: 10.6 10*3/uL — ABNORMAL HIGH (ref 4.0–10.5)

## 2009-08-07 ENCOUNTER — Encounter: Payer: Self-pay | Admitting: Physician Assistant

## 2009-08-08 ENCOUNTER — Telehealth: Payer: Self-pay | Admitting: Physician Assistant

## 2009-08-08 ENCOUNTER — Telehealth (INDEPENDENT_AMBULATORY_CARE_PROVIDER_SITE_OTHER): Payer: Self-pay | Admitting: *Deleted

## 2009-08-14 ENCOUNTER — Encounter: Payer: Self-pay | Admitting: Critical Care Medicine

## 2009-08-15 ENCOUNTER — Encounter: Payer: Self-pay | Admitting: Physician Assistant

## 2009-08-20 ENCOUNTER — Telehealth: Payer: Self-pay | Admitting: Physician Assistant

## 2009-08-22 ENCOUNTER — Ambulatory Visit: Payer: Self-pay | Admitting: Physician Assistant

## 2009-08-22 LAB — CONVERTED CEMR LAB
BUN: 16 mg/dL (ref 6–23)
CO2: 27 meq/L (ref 19–32)
Chloride: 98 meq/L (ref 96–112)
Creatinine, Ser: 1.23 mg/dL — ABNORMAL HIGH (ref 0.40–1.20)
Potassium: 3.8 meq/L (ref 3.5–5.3)

## 2009-08-31 ENCOUNTER — Telehealth: Payer: Self-pay | Admitting: Physician Assistant

## 2009-09-04 ENCOUNTER — Encounter: Payer: Self-pay | Admitting: Critical Care Medicine

## 2009-10-10 ENCOUNTER — Encounter (INDEPENDENT_AMBULATORY_CARE_PROVIDER_SITE_OTHER): Payer: Self-pay | Admitting: Nurse Practitioner

## 2009-10-10 ENCOUNTER — Telehealth: Payer: Self-pay | Admitting: Physician Assistant

## 2009-10-21 ENCOUNTER — Encounter: Payer: Self-pay | Admitting: Critical Care Medicine

## 2009-10-29 ENCOUNTER — Encounter (INDEPENDENT_AMBULATORY_CARE_PROVIDER_SITE_OTHER): Payer: Self-pay | Admitting: Internal Medicine

## 2009-10-29 ENCOUNTER — Ambulatory Visit: Payer: Self-pay | Admitting: Physician Assistant

## 2009-11-03 LAB — CONVERTED CEMR LAB
ALT: 17 units/L (ref 0–35)
AST: 25 units/L (ref 0–37)
Albumin: 4.3 g/dL (ref 3.5–5.2)
Alkaline Phosphatase: 91 units/L (ref 39–117)
HDL: 54 mg/dL (ref >39)
Total Bilirubin: 0.4 mg/dL (ref 0.3–1.2)
Total CHOL/HDL Ratio: 3.5
Total Protein: 7.7 g/dL (ref 6.0–8.3)
Triglycerides: 113 mg/dL (ref <150)

## 2009-11-05 ENCOUNTER — Telehealth (INDEPENDENT_AMBULATORY_CARE_PROVIDER_SITE_OTHER): Payer: Self-pay | Admitting: Internal Medicine

## 2009-11-14 ENCOUNTER — Ambulatory Visit (HOSPITAL_COMMUNITY): Admission: RE | Admit: 2009-11-14 | Discharge: 2009-11-14 | Payer: Self-pay | Admitting: Nurse Practitioner

## 2009-11-14 ENCOUNTER — Ambulatory Visit: Payer: Self-pay | Admitting: Nurse Practitioner

## 2009-11-19 ENCOUNTER — Ambulatory Visit: Payer: Self-pay | Admitting: Critical Care Medicine

## 2009-12-11 ENCOUNTER — Ambulatory Visit: Payer: Self-pay | Admitting: Nurse Practitioner

## 2009-12-11 DIAGNOSIS — I739 Peripheral vascular disease, unspecified: Secondary | ICD-10-CM

## 2010-02-08 LAB — CONVERTED CEMR LAB
Bilirubin Urine: NEGATIVE
Blood in Urine, dipstick: NEGATIVE
Glucose, Urine, Semiquant: NEGATIVE
Ketones, urine, test strip: NEGATIVE
Nitrite: NEGATIVE
Protein, U semiquant: NEGATIVE
Specific Gravity, Urine: 1.01
Urobilinogen, UA: 0.2
WBC Urine, dipstick: NEGATIVE
pH: 6

## 2010-02-12 NOTE — Progress Notes (Signed)
Summary: swelling of feet   Phone Note Call from Patient Call back at [facility] Phone [phone]   Summary of Call: The pt wants to inform the provider that she felt twice last week and both of her feet and legs are swollen and she cannot leaf her feet.  Pt is aware that she has an appointment on April 18, but she wants to do until her appointment.   Alben Spittle PA-C  Initial call taken by: Manon Hilding,  April 15, 2009 11:36 AM  Follow-up for Phone Call        [place], Please call and find out what is going on. Follow-up by: Tereso Newcomer PA-C,  April 15, 2009 1:52 PM  Additional Follow-up for Phone Call Additional follow up Details #1::        spoke with pt and she said she was fine while she was here for a lab and then when she got home her both her legs and feet are swollen.... pt says she fell weeks ago.... pt says she can barley lift her legs up/feet to walk... pt has not tried anything to get the swelling down... pt says her legs don't hurt just swollen.... i advise pt to elevate legs and alternate from ice pack to heat pack until she recieves more information from you. Additional Follow-up by: [place] Shannon,  April 15, 2009 3:28 PM    Additional Follow-up for Phone Call Additional follow up Details #2::    schedule appt Follow-up by: Tereso Newcomer PA-C,  April 15, 2009 4:12 PM  Additional Follow-up for Phone Call Additional follow up Details #3:: Details for Additional Follow-up Action Taken: pt has apt Additional Follow-up by: [place] Shannon,  April 15, 2009 4:41 PM

## 2010-02-12 NOTE — Progress Notes (Signed)
Summary: CXR Normal  Phone Note Outgoing Call   Reason for Call: Discuss lab or test results Summary of Call: call the pt and tell her cxr ok,  no pneumonia Initial call taken by: Storm Frisk MD,  February 08, 2009 3:08 PM  Follow-up for Phone Call        called, spoke with pt.  Pt informed of cxr results as stated above per PW.  She verbalized understanding.  Follow-up by: Gweneth Dimitri RN,  February 10, 2009 2:19 PM

## 2010-02-12 NOTE — Progress Notes (Signed)
   Phone Note Call from Patient Call back at Home Phone [phone]   Summary of Call: Pt requesting Ashlee call her back because she needs to go to [facility] on May 19 and she wants to know more about it. Alben Spittle PA-c Initial call taken by: Manon Hilding,  May 09, 2009 2:48 PM  Follow-up for Phone Call        Left message on answering machine for pt to call back.Marland KitchenMarland KitchenMarland KitchenArmenia [name]  May 12, 2009 9:18 AM   spoke with pt and she said she spoke with Williamson already Follow-up by: Ashlee [name],  May 12, 2009 2:41 PM

## 2010-02-12 NOTE — Letter (Signed)
Summary: [facility]   Imported By: Arta Bruce 09/04/2009 15:41:14  _____________________________________________________________________  External Attachment:    Type:   Image     Comment:   External Document

## 2010-02-12 NOTE — Progress Notes (Signed)
Summary: refill  Phone Note Call from Patient Call back at [facility] Phone [phone]   Caller: Patient Call For: wright Reason for Call: Refill Medication Summary of Call: Refill spiriva handihaler 30mcg.//[facility] [address] Initial call taken by: Darletta Moll,  August 08, 2009 1:15 PM  Follow-up for Phone Call        Spiriva Rx last sent on 07/23/09 #30 x 6.   [facility], spoke with Pat[facility].  Informed her of above, she stated they did receive the rx - no further rx is needed.   Called, spoke with pt.  Pt [facility]  She verbalized understanding.  Gweneth Dimitri RN  August 08, 2009 1:24 PM

## 2010-02-12 NOTE — Letter (Signed)
Summary: Handout Printed  Printed Handout:  - Peripheral Vascular Disease 

## 2010-02-12 NOTE — Progress Notes (Signed)
   Phone Note Outgoing Call   Summary of Call: Please tell Ashlee [name] that she can stop the Prempro. Initial call taken by: Brynda Rim,  February 28, 2009 4:18 PM  Follow-up for Phone Call        spoke with pt and she is aware to stop.. Follow-up by: [place] Shannon,  March 04, 2009 12:58 PM

## 2010-02-12 NOTE — Assessment & Plan Note (Signed)
Summary: 2 MONTH FOR BP///KT   Vital Signs:  Patient profile:   75 year old female Weight:      138 pounds Temp:     97.8 degrees F Pulse rate:   64 / minute Pulse rhythm:   regular Resp:     18 per minute BP sitting:   103 / 60  (left arm) Cuff size:   regular  Vitals Entered By: Vesta Mixer CMA (April 28, 2009 2:08 PM) CC: 2 month f/u on bp, pt feeling good Is Patient Diabetic? No Pain Assessment Patient in pain? no       Does patient need assistance? Ambulation Normal   Primary Care Provider:  Tereso Newcomer PA-C  CC:  2 month f/u on bp and pt feeling good.  History of Present Illness: Here for f/u. She has been taking lisinopril instead of norvasc and feels much better. Her edema is gone. She denies any further lightheadedness or dizziness.  She has not fallen or felt like she may fall. She denies chest pain. She is drinking more water. She is waiting on a referral to Cardiology. She does note a rash on her legs near her ankles.  It is pruritic.  She thinks her dogs carried in something from outside and and she reacted to it.  She thinks it may be poison ivy.   Allergies (verified): 1)  ! * Fosmax 2)  ! * Aricept  Physical Exam  General:  alert, well-developed, and well-nourished.   Head:  normocephalic and atraumatic.   Neck:  supple.   Lungs:  normal breath sounds.   Heart:  normal rate and regular rhythm.   Extremities:  no edema  Skin:  small macular rash on distal legs above the ankle with some excoriation dry skin noted too Psych:  normally interactive.     Impression & Recommendations:  Problem # 1:  ESSENTIAL HYPERTENSION, BENIGN (ICD-401.1)  much better controlled check bmet Her updated medication list for this problem includes:    Lisinopril 10 Mg Tabs (Lisinopril) .Marland Kitchen... Take 1 tablet by mouth once a day  Orders: T-Comprehensive Metabolic Panel ([id number])  Problem # 2:  SKIN RASH (ICD-782.1)  looks like contact dermatitis Rx  for triamcinolone  Her updated medication list for this problem includes:    Triamcinolone Acetonide 0.1 % Crea (Triamcinolone acetonide) .Marland Kitchen... Apply two times a day for one week or until clear  Problem # 3:  DIZZINESS (ICD-780.4) improved I still want her to see cardiology with her risk factors and CAD equiv of carotid stenosis if card w/u neg, will re-eval her at f/u and decide on referral back to PT for balance  Problem # 4:  DYSLIPIDEMIA (ICD-272.4)  schedule fasting lipids  Her updated medication list for this problem includes:    Pravachol 40 Mg Tabs (Pravastatin sodium) .Marland Kitchen... Take 1 tab by mouth at bedtime for cholesterol  Orders: T-Comprehensive Metabolic Panel ([id number])  Complete Medication List: 1)  Xanax 1 Mg Tabs (alprazolam)  .... Take 1/2  tablet by mouth 4x a day and 1 at bedtime 2)  Paxil 20 Mg Tabs (Paroxetine hcl) .... Take 1 tablet by mouth daily 3)  Spiriva Handihaler 18 Mcg Caps (Tiotropium bromide monohydrate) .... Inhale once daily 4)  Tylenol Extra Strength 500 Mg Tabs (Acetaminophen) .... As needed 5)  Eq Womans Laxative 5 Mg Tbec (Bisacodyl) .... As needed 6)  Proair Hfa 108 (90 Base) Mcg/act Aers (Albuterol sulfate) .... As needed one to two puff every 4  hours as needed 7)  Miralax Powd (Polyethylene glycol 3350) .Marland Kitchen.. 17g by mouth once daily constipation. mix powder with a liquid and drink 8)  Paediatric nurse  .... Falls risk 9)  Oxygen  .... 2l at bedtime 10)  Walker With Seat  .... Needed for gait and balance dysfunction 11)  Centrum Silver Chew (Multiple vitamins-minerals) .Marland Kitchen.. 1 by mouth daily 12)  Fish Oil 1000 Mg Caps (Omega-3 fatty acids) .Marland Kitchen.. 1 by mouth daily 13)  Aspirin 325 Mg Tabs (Aspirin) .... Once daily 14)  Caltrate 600+d Plus 600-400 Mg-unit Tabs (Calcium carbonate-vit d-min) .... Take 1 tablet by mouth two times a day 15)  Actonel 150 Mg Tabs (Risedronate sodium) .Marland Kitchen.. 1 by mouth one time per month 16)  Omeprazole 40 Mg Cpdr (Omeprazole)  .... Take 1 tablet by mouth once a day for stomach acid 17)  Advair Diskus 250-50 Mcg/dose Aepb (Fluticasone-salmeterol) .Marland Kitchen.. 1 puff two times a day 18)  Prednisone 10 Mg Tabs (Prednisone) .... Take as directed 4 each am x3days, 3 x 3days, 2 x 3days, 1 x 3days then stop 19)  Pravachol 40 Mg Tabs (Pravastatin sodium) .... Take 1 tab by mouth at bedtime for cholesterol 20)  Miconazole 3 200 Mg Supp (Miconazole nitrate) .Marland Kitchen.. 1 supp per vagina at bedtime x 3 nights 21)  Lisinopril 10 Mg Tabs (Lisinopril) .... Take 1 tablet by mouth once a day 22)  Triamcinolone Acetonide 0.1 % Crea (Triamcinolone acetonide) .... Apply two times a day for one week or until clear  Patient Instructions: 1)  Return in the next 2-4 weeks fasting for labs (Lipids).  Do not eat or drink anything after midnight except water. 2)  Dx:  272.4 3)  Please schedule a follow-up appointment in 3 months with Gurpreet Mariani for blood pressure.  Prescriptions: TRIAMCINOLONE ACETONIDE 0.1 % CREA (TRIAMCINOLONE ACETONIDE) apply two times a day for one week or until clear  #30 grams x 0   Entered and Authorized by:   Tereso Newcomer PA-C   Signed by:   Tereso Newcomer PA-C on 04/28/2009   Method used:   [facility]  [address] [phone]* (retail)       [address]       [place], [place]  [id number]       Ph: [phone] or [phone]       Fax: [phone]   RxID:   [id number]

## 2010-02-12 NOTE — Assessment & Plan Note (Signed)
Summary: Pulmonary OV   Primary Provider/Referring Provider:  Tereso Newcomer PA-C  CC:  ROV resched from 03/10/09, Patient complains of edema in lower extremities X 7 days, medication for her HTN has been chnaged and has helped the edema, and the new med is Lisinopril although previously listed on med list..  History of Present Illness: Pulmonary:   75 yo WF with COPD and primary emphysema--current smoker  April 22, 2009 2:23 PM Recently more edema in feet,  now better with BP med change,  now on ace and off norvasc feels weak in legs.  no change in dyspnea.  quit smoking for two weeks,  then stressed over dog dying and now smoking 2-3 cigs/day.   Has a dry cough.  Notes dyspnea is at baseline.      Preventive Screening-Counseling & Management  Alcohol-Tobacco     Smoking Status: current     Packs/Day: <0.25  Current Medications (verified): 1)  Xanax 1 Mg  Tabs (Alprazolam) .... Take 1/2  Tablet By Mouth 4x A Day and 1 At Bedtime 2)  Paxil 20 Mg  Tabs (Paroxetine Hcl) .... Take 1 Tablet By Mouth Daily 3)  Spiriva Handihaler 18 Mcg  Caps (Tiotropium Bromide Monohydrate) .... Inhale Once Daily 4)  Tylenol Extra Strength 500 Mg  Tabs (Acetaminophen) .... As Needed 5)  Eq Womans Laxative 5 Mg  Tbec (Bisacodyl) .... As Needed 6)  Proair Hfa 108 (90 Base) Mcg/act  Aers (Albuterol Sulfate) .... As Needed One To Two Puff Every 4 Hours As Needed 7)  Miralax   Powd (Polyethylene Glycol 3350) .Marland Kitchen.. 17g By Mouth Once Daily Constipation. Mix Powder With A Liquid and Drink 8)  Shower Chair .... Falls Risk 9)  Oxygen .... At Bedtime 10)  Walker With Seat .... Needed For Gait and Balance Dysfunction 11)  Centrum Silver  Chew (Multiple Vitamins-Minerals) .Marland Kitchen.. 1 By Mouth Daily 12)  Fish Oil 1000 Mg Caps (Omega-3 Fatty Acids) .Marland Kitchen.. 1 By Mouth Daily 13)  Aspirin 325 Mg Tabs (Aspirin) .... Once Daily 14)  Caltrate 600+d Plus 600-400 Mg-Unit Tabs (Calcium Carbonate-Vit D-Min) .... Take 1 Tablet By Mouth  Two Times A Day 15)  Actonel 150 Mg Tabs (Risedronate Sodium) .Marland Kitchen.. 1 By Mouth One Time Per Month 16)  Omeprazole 40 Mg Cpdr (Omeprazole) .... Take 1 Tablet By Mouth Once A Day For Stomach Acid 17)  Advair Diskus 250-50 Mcg/dose Aepb (Fluticasone-Salmeterol) .Marland Kitchen.. 1 Puff Two Times A Day 18)  Prednisone 10 Mg  Tabs (Prednisone) .... Take As Directed 4 Each Am X3days, 3 X 3days, 2 X 3days, 1 X 3days Then Stop 19)  Pravachol 40 Mg Tabs (Pravastatin Sodium) .... Take 1 Tab By Mouth At Bedtime For Cholesterol 20)  Miconazole 3 200 Mg Supp (Miconazole Nitrate) .Marland Kitchen.. 1 Supp Per Vagina At Bedtime X 3 Nights 21)  Lisinopril 10 Mg Tabs (Lisinopril) .... Take 1 Tablet By Mouth Once A Day  Past History:  Past medical, surgical, family and social histories (including risk factors) reviewed, and no changes noted (except as noted below).  Past Medical History: Reviewed history from 08/23/2007 and no changes required.  COLONIC POLYPS (ICD-211.3) OSTEOPOROSIS NOS (ICD-733.00) ANEMIA, FOLATE-DEFICIENCY (ICD-281.2) LEUKOPLAKIA, VOCAL CORDS (ICD-478.5) SEIZURE DISORDER (ICD-780.39) PANIC ATTACK (ICD-300.01) POST TRAUMATIC STRESS SYNDROME (ICD-309.81) SMOKER (ICD-305.1) BRONCHITIS, RECURRENT (ICD-491.9) COPD (ICD-496)   -FeV1 64%  DLCO 42% 2005 BRONCHOPNEUMONIA (ICD-485)  Past Surgical History: Reviewed history from 09/01/2006 and no changes required. Current Problems:  COLONIC POLYPS (ICD-211.3) TUBAL LIGATION, HX  OF (ICD-V26.51) BREAST BIOPSY, HX OF (ICD-V15.9) APPENDECTOMY, HX OF (ICD-V45.79) * FRACTURE RIGHT PUBIC RAMUS  Family History: Reviewed history from 07/31/2007 and no changes required. Mother died hemhorrage Father unknown Has 43 1/2-siblings daughters with thyroid problems.  Social History: Reviewed history from 07/31/2007 and no changes required. Lives with learning disabled daughter.Previous bartender. widowed Current Smoker Alcohol use-no Packs/Day:  <0.25  Review of  Systems       The patient complains of shortness of breath with activity and non-productive cough.  The patient denies shortness of breath at rest, productive cough, coughing up blood, chest pain, irregular heartbeats, acid heartburn, indigestion, loss of appetite, weight change, abdominal pain, difficulty swallowing, sore throat, tooth/dental problems, headaches, nasal congestion/difficulty breathing through nose, sneezing, itching, ear ache, anxiety, depression, hand/feet swelling, joint stiffness or pain, rash, change in color of mucus, and fever.    Vital Signs:  Patient profile:   75 year old female Height:      65 inches Weight:      143.2 pounds O2 Sat:      89 % on Room air Temp:     97.9 degrees F oral Pulse rate:   96 / minute BP sitting:   124 / 80  (left arm) Cuff size:   regular  Vitals Entered By: Denna Haggard, CMA (April 22, 2009 2:05 PM)  O2 Sat at Rest %:  89% O2 Flow:  Room air  O2 Sat Comments Patient recovered on Room Air approx 3 min upon arrival to 91%  Physical Exam  Additional Exam:  Gen: Pleasant, well nourished, in no distress ENT: no lesions, no postnasal drip Neck: No JVD, no TMG, no carotid bruits Lungs: no use of accessory muscles,distant BS,  poor airflow Cardiovascular: RRR, heart sounds normal, no murmurs or gallops, no peripheral edema Musculoskeletal: No deformities, no cyanosis or clubbing     Impression & Recommendations:  Problem # 1:  COPD (ICD-496) Assessment Unchanged Severe Copd Golds stage III with ongoing smoking. plan cont inhaled meds stop smoking  rov 3 months   Medications Added to Medication List This Visit: 1)  Oxygen  .... 2l at bedtime  Complete Medication List: 1)  Xanax 1 Mg Tabs (alprazolam)  .... Take 1/2  tablet by mouth 4x a day and 1 at bedtime 2)  Paxil 20 Mg Tabs (Paroxetine hcl) .... Take 1 tablet by mouth daily 3)  Spiriva Handihaler 18 Mcg Caps (Tiotropium bromide monohydrate) .... Inhale once  daily 4)  Tylenol Extra Strength 500 Mg Tabs (Acetaminophen) .... As needed 5)  Eq Womans Laxative 5 Mg Tbec (Bisacodyl) .... As needed 6)  Proair Hfa 108 (90 Base) Mcg/act Aers (Albuterol sulfate) .... As needed one to two puff every 4 hours as needed 7)  Miralax Powd (Polyethylene glycol 3350) .Marland Kitchen.. 17g by mouth once daily constipation. mix powder with a liquid and drink 8)  Paediatric nurse  .... Falls risk 9)  Oxygen  .... 2l at bedtime 10)  Walker With Seat  .... Needed for gait and balance dysfunction 11)  Centrum Silver Chew (Multiple vitamins-minerals) .Marland Kitchen.. 1 by mouth daily 12)  Fish Oil 1000 Mg Caps (Omega-3 fatty acids) .Marland Kitchen.. 1 by mouth daily 13)  Aspirin 325 Mg Tabs (Aspirin) .... Once daily 14)  Caltrate 600+d Plus 600-400 Mg-unit Tabs (Calcium carbonate-vit d-min) .... Take 1 tablet by mouth two times a day 15)  Actonel 150 Mg Tabs (Risedronate sodium) .Marland Kitchen.. 1 by mouth one time per month 16)  Omeprazole  40 Mg Cpdr (Omeprazole) .... Take 1 tablet by mouth once a day for stomach acid 17)  Advair Diskus 250-50 Mcg/dose Aepb (Fluticasone-salmeterol) .Marland Kitchen.. 1 puff two times a day 18)  Prednisone 10 Mg Tabs (Prednisone) .... Take as directed 4 each am x3days, 3 x 3days, 2 x 3days, 1 x 3days then stop 19)  Pravachol 40 Mg Tabs (Pravastatin sodium) .... Take 1 tab by mouth at bedtime for cholesterol 20)  Miconazole 3 200 Mg Supp (Miconazole nitrate) .Marland Kitchen.. 1 supp per vagina at bedtime x 3 nights 21)  Lisinopril 10 Mg Tabs (Lisinopril) .... Take 1 tablet by mouth once a day  Other Orders: Est. Patient Level III (66063)  Patient Instructions: 1)  No change in medications 2)  Return in 3 months 3)  Quit smoking

## 2010-02-12 NOTE — Progress Notes (Signed)
  Phone Note Other Incoming   Request: Send information Summary of Call: Request received from [facility] forwarded to [facility].       

## 2010-02-12 NOTE — Progress Notes (Signed)
Summary: Nuclear Pre-Procedure  Phone Note Outgoing Call Call back at [facility] Phone [phone]   Call placed by: Stanton Kidney, EMT-P,  May 29, 2009 3:02 PM Action Taken: Phone Call Completed Summary of Call: Left message with information on [employer] Information Sheet (see scanned document for details).     Nuclear Med Background Indications for Stress Test: Evaluation for Ischemia   History: Asthma, COPD, Echo, Emphysema  History Comments: 01/08/09 Echo: EF=55-66%   Symptoms: Dizziness, DOE    Nuclear Pre-Procedure Cardiac Risk Factors: Carotid Disease, Hypertension, Lipids, Smoker Height (in): 65

## 2010-02-12 NOTE — Medication Information (Signed)
Summary: MEDICATION COST PROFILE  MEDICATION COST PROFILE   Imported By: Arta Bruce 12/23/2009 16:56:04  _____________________________________________________________________  External Attachment:    Type:   Image     Comment:   External Document

## 2010-02-12 NOTE — Letter (Signed)
Summary: CMNs for Oxygen/[facility]  CMNs for Oxygen/[facility]   Imported By: Sherian Rein 10/27/2009 14:04:17  _____________________________________________________________________  External Attachment:    Type:   Image     Comment:   External Document

## 2010-02-12 NOTE — Miscellaneous (Signed)
Summary: ININTIAL SUMMARY  ININTIAL SUMMARY   Imported By: Arta Bruce 03/18/2009 15:46:39  _____________________________________________________________________  External Attachment:    Type:   Image     Comment:   External Document

## 2010-02-12 NOTE — Letter (Signed)
Summary: *HSN Results Follow up  [facility]  [address] [place], [place] [address]   Phone: [phone]  Fax: [phone]      02/06/2009   TRINIDEE SCHRAG [address] [place], [place]  [address]   Dear  Ms. [employer],                            ____S.Drinkard,FNP   ____D. Gore,FNP       ____B. McPherson,MD   ____V. Rankins,MD    ____E. Mulberry,MD    ____N. Daphine Deutscher, FNP  ____D. Reche Dixon, MD    ____K. Philipp Deputy, MD    __x__S. Alben Spittle, PA-C     This letter is to inform you that your recent test(s):  _______Pap Smear    ___x____Lab Test     _______X-ray    ___x____ is within acceptable limits  _______ requires a medication change  _______ requires a follow-up lab visit  _______ requires a follow-up visit with your provider   Comments:       _________________________________________________________ If you have any questions, please contact our office                     Sincerely,  Tereso Newcomer PA-C [facility]

## 2010-02-12 NOTE — Assessment & Plan Note (Signed)
Summary: one week f/u /tmm   Vital Signs:  Patient profile:   75 year old Ashlee [name] Height:      65 inches Weight:      141 pounds BMI:     23.55 Temp:     97.8 degrees F oral Pulse rate:   111 / minute Pulse rhythm:   regular Resp:     18 per minute BP sitting:   150 / 90  (left arm) Cuff size:   regular  Vitals Entered By: Armenia Shannon (February 27, 2009 2:02 PM) CC: one f/u.... Is Patient Diabetic? No Pain Assessment Patient in pain? no       Does patient need assistance? Functional Status Self care Ambulation Normal   Primary Care Provider:  Tereso Newcomer PA-C  CC:  one f/u.....  History of Present Illness: 75 year old Ashlee [name] presents for followup.  I saw her last about a month ago after she was discharged from the hospital for COPD exacerbation.  She had developed acute renal failure and elevated LFTs.  Her workup was essentially negative.  The abnormalities are likely related to dehydration.  Her blood pressure medications were held as well as her pravastatin.  She did have carotid Dopplers demonstrated 40-59% left ICA stenosis.  Followup labs by me demonstrated normalization of her LFTs, creatinine, and hemoglobin.  She followed up with her pulmonologist earlier.  He placed her on prednisone.  I have placed her on doxycycline.  Today, she knows that she is doing much better.  Her breathing is improved.  Unfortunately, she continues to smoke.  She denies chest pain.  She denies syncope.  She denies headaches.  She denies orthopnea, PND or pedal edema.  She denies palpitations  Problems Prior to Update: 1)  Hormone Replacement Therapy  (ICD-V07.4) 2)  Dyslipidemia  (ICD-272.4) 3)  Carotid Artery Stenosis  (ICD-433.10) 4)  Transaminases, Serum, Elevated  (ICD-790.4) 5)  Anemia, Normocytic  (ICD-285.9) 6)  Essential Hypertension, Benign  (ICD-401.1) 7)  Contact Dermatitis  (ICD-692.9) 8)  Tick Bite  (ICD-E906.4) 9)  Gait Disturbance  (ICD-781.2) 10)  Syncope   (ICD-780.2) 11)  Polyneuropathy Other Diseases Classified [place]  (ICD-357.4) 12)  Screening For Malignant Neoplasm, Cervix  (ICD-V76.2) 13)  Folate-deficiency Anemia  (ICD-281.2) 14)  Breast Mass, Right  (ICD-611.72) 15)  Sinusitis  (ICD-473.9) 16)  Labrynthitis  (ICD-386.30) 17)  Bronchitis, Obstructive Chronic, With Exacerbation  (ICD-491.21) 18)  Acid Reflux Disease  (ICD-530.81) 19)  Health Maintenance Exam  (ICD-V70.0) 20)  Colonic Polyps  (ICD-211.3) 21)  Tubal Ligation, Hx of  (ICD-V26.Ashlee) 22)  Breast Biopsy, Hx of  (ICD-V15.9) 23)  Appendectomy, Hx of  (ICD-V45.79) 24)  Fracture Right Pubic Ramus  () 25)  Osteoporosis Nos  (ICD-733.00) 26)  Anemia, Folate-deficiency  (ICD-281.2) 27)  Leukoplakia, 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 Take 1 Tablet By Mouth Daily 2)  Xanax 1 Mg  Tabs (Alprazolam) .... Take 1/2  Tablet By Mouth 4x A Day and 1 At Bedtime 3)  Paxil 20 Mg  Tabs (Paroxetine Hcl) .... Take 1 Tablet By Mouth Daily 4)  Spiriva Handihaler 18 Mcg  Caps (Tiotropium Bromide Monohydrate) .... Inhale Once Daily 5)  Tylenol  Extra Strength 500 Mg  Tabs (Acetaminophen) .... As Needed 6)  Eq Womans Laxative 5 Mg  Tbec (Bisacodyl) .... As Needed 7)  Proair Hfa 108 (90 Base) Mcg/act  Aers (Albuterol Sulfate) .... As Needed One To Two Puff Every 4 Hours As Needed 8)  Miralax   Powd (Polyethylene Glycol 3350) .Marland Kitchen.. 17g By Mouth Once Daily Constipation. Mix Powder With A Liquid and Drink 9)  Shower Chair .... Falls Risk 10)  Oxygen .... At Bedtime 11)  Walker With Seat .... Needed For Gait and Balance Dysfunction 12)  Centrum Silver  Chew (Multiple  Vitamins-Minerals) .Marland Kitchen.. 1 By Mouth Daily 13)  Fish Oil 1000 Mg Caps (Omega-3 Fatty Acids) .Marland Kitchen.. 1 By Mouth Daily 14)  Aspirin 325 Mg Tabs (Aspirin) .... Once Daily 15)  Caltrate 600+d Plus 600-400 Mg-Unit Tabs (Calcium Carbonate-Vit D-Min) .... Take 1 Tablet By Mouth Two Times A Day 16)  Actonel 150 Mg Tabs (Risedronate Sodium) .Marland Kitchen.. 1 By Mouth One Time Per Month 17)  Omeprazole 40 Mg Cpdr (Omeprazole) .... Take 1 Tablet By Mouth Once A Day For Stomach Acid 18)  Advair Diskus 500-50 Mcg/dose Aepb (Fluticasone-Salmeterol) .... Take One Puff Two Times A Day 19)  Prednisone 10 Mg  Tabs (Prednisone) .... Take As Directed 4 Each Am X3days, 3 X 3days, 2 X 3days, 1 X 3days Then Stop  Allergies (verified): 1)  ! * Fosmax 2)  ! * Aricept  Past History:  Past Medical History: Last updated: 08/23/2007  COLONIC POLYPS (ICD-211.3) OSTEOPOROSIS NOS (ICD-733.00) ANEMIA, FOLATE-DEFICIENCY (ICD-281.2) LEUKOPLAKIA, VOCAL CORDS (ICD-478.5) SEIZURE DISORDER (ICD-780.39) PANIC ATTACK (ICD-300.01) POST TRAUMATIC STRESS SYNDROME (ICD-309.81) SMOKER (ICD-305.1) BRONCHITIS, RECURRENT (ICD-491.9) COPD (ICD-496)   -FeV1 64%  DLCO 42% 2005 BRONCHOPNEUMONIA (ICD-485)  Past Surgical History: Last updated: 09/01/2006 Current Problems:  COLONIC POLYPS (ICD-211.3) TUBAL LIGATION, HX OF (ICD-V26.Ashlee) BREAST BIOPSY, HX OF (ICD-V15.9) APPENDECTOMY, HX OF (ICD-V45.79) * FRACTURE RIGHT PUBIC RAMUS  Physical Exam  General:  alert, well-developed, and well-nourished.   Head:  normocephalic and atraumatic.   Neck:  supple.   Lungs:  decreased breath sounds bilaterally No wheezing No crackles Heart:  regular rhythm and tachycardia.   Neurologic:  alert & oriented X3 and cranial nerves II-XII intact.   Psych:  normally interactive.     Impression & Recommendations:  Problem # 1:  ESSENTIAL HYPERTENSION, BENIGN (ICD-401.1)  need to restart meds with her sinus tach, considered putting her on  diltiazem but, she has an incomplete RBBB and LAFB . . . so she has evidence of conduction system disease I would be concerned about putting her on any AV nodal blocking agents I suspect her HR is up due to continued smoking and deconditioning will restart norvasc 5 mg once daily first . . . may not need lisinopril with h/o dizziness on both meds in the past  Her updated medication list for this problem includes:    Norvasc 5 Mg Tabs (Amlodipine besylate) .Marland Kitchen... Take 1 tablet by mouth once a day for blood pressure  Orders: EKG w/ Interpretation (93000)  Problem # 2:  DYSLIPIDEMIA (ICD-272.4)  LFTs ok at last visit will restart pravastatin  Her updated medication list for this problem includes:    Pravachol 40 Mg Tabs (Pravastatin sodium) .Marland Kitchen... Take 1 tab by mouth at bedtime for cholesterol  Problem # 3:  CAROTID ARTERY STENOSIS (ICD-433.10) goal LDL should be < 70 repeat dopplers in 1 year  Her updated medication list for this problem includes:  Aspirin 325 Mg Tabs (Aspirin) ..... Once daily  Problem # 4:  HORMONE REPLACEMENT THERAPY (ICD-V07.4) she is at increased risk with prempro she is open to d/c it I will check into it further and will probably stop she is on bisphosphonate already for osteoporosis  Complete Medication List: 1)  Prempro 0.3-1.5 Mg Tabs (Conj estrog-medroxyprogest ace) .... Take 1 tablet by mouth daily 2)  Xanax 1 Mg Tabs (alprazolam)  .... Take 1/2  tablet by mouth 4x a day and 1 at bedtime 3)  Paxil 20 Mg Tabs (Paroxetine hcl) .... Take 1 tablet by mouth daily 4)  Spiriva Handihaler 18 Mcg Caps (Tiotropium bromide monohydrate) .... Inhale once daily 5)  Tylenol Extra Strength 500 Mg Tabs (Acetaminophen) .... As needed 6)  Eq Womans Laxative 5 Mg Tbec (Bisacodyl) .... As needed 7)  Proair Hfa 108 (90 Base) Mcg/act Aers (Albuterol sulfate) .... As needed one to two puff every 4 hours as needed 8)  Miralax Powd (Polyethylene glycol 3350) .Marland Kitchen.. 17g by  mouth once daily constipation. mix powder with a liquid and drink 9)  Paediatric nurse  .... Falls risk 10)  Oxygen  .... At bedtime 11)  Walker With Seat  .... Needed for gait and balance dysfunction 12)  Centrum Silver Chew (Multiple vitamins-minerals) .Marland Kitchen.. 1 by mouth daily 13)  Fish Oil 1000 Mg Caps (Omega-3 fatty acids) .Marland Kitchen.. 1 by mouth daily 14)  Aspirin 325 Mg Tabs (Aspirin) .... Once daily 15)  Caltrate 600+d Plus 600-400 Mg-unit Tabs (Calcium carbonate-vit d-min) .... Take 1 tablet by mouth two times a day 16)  Actonel 150 Mg Tabs (Risedronate sodium) .Marland Kitchen.. 1 by mouth one time per month 17)  Omeprazole 40 Mg Cpdr (Omeprazole) .... Take 1 tablet by mouth once a day for stomach acid 18)  Advair Diskus 500-50 Mcg/dose Aepb (Fluticasone-salmeterol) .... Take one puff two times a day 19)  Prednisone 10 Mg Tabs (Prednisone) .... Take as directed 4 each am x3days, 3 x 3days, 2 x 3days, 1 x 3days then stop 20)  Pravachol 40 Mg Tabs (Pravastatin sodium) .... Take 1 tab by mouth at bedtime for cholesterol 21)  Norvasc 5 Mg Tabs (Amlodipine besylate) .... Take 1 tablet by mouth once a day for blood pressure  Patient Instructions: 1)  Return to the lab in one month for BP check and a CMET (401.1, 272.4). 2)  Please schedule a follow-up appointment in 2 months for blood pressure.  Prescriptions: NORVASC 5 MG TABS (AMLODIPINE BESYLATE) Take 1 tablet by mouth once a day for blood pressure  #30 x 5   Entered and Authorized by:   Tereso Newcomer PA-C   Signed by:   Tereso Newcomer PA-C on 02/27/2009   Method used:   Print then Give to Patient   RxID:   [id number] PRAVACHOL 40 MG TABS (PRAVASTATIN SODIUM) Take 1 tab by mouth at bedtime for cholesterol  #30 x 5   Entered and Authorized by:   Tereso Newcomer PA-C   Signed by:   Tereso Newcomer PA-C on 02/27/2009   Method used:   Print then Give to Patient   RxID:   [id number]    EKG  Procedure date:  02/27/2009  Findings:      Sinus tachycardia  with rate of:  116 LAFB incomplete RBBB no sig change from previous tracing

## 2010-02-12 NOTE — Progress Notes (Signed)
   Phone Note Outgoing Call   Summary of Call: O2 form in your basket. Dr. Delford Field signed it. Please call [facility] to see what else they need.  Initial call taken by: Tereso Newcomer PA-C,  August 20, 2009 10:07 PM  Follow-up for Phone Call        spoke with [facility] and they said they have not recieved the paper yet.... they wants to me fax form back.... Armenia Shannon  August 21, 2009 10:55 AM

## 2010-02-12 NOTE — Progress Notes (Signed)
Summary: YEAST INFECTION/ADVAIR WASNT CALLED IN  Medications Added ADVAIR DISKUS 250-50 MCG/DOSE AEPB (FLUTICASONE-SALMETEROL) 1 puff two times a day MICONAZOLE 3 200 MG SUPP (MICONAZOLE NITRATE) 1 supp per vagina at bedtime x 3 nights       Phone Note Call from Patient Call back at Home Phone [phone]   Reason for Call: Refill Medication Summary of Call: Ashlee [name] PT. MS Gick SAYS THAT SHE HAS A YEAST INFECTION AND SHE THINKS IT CAME FROM ONE OF HER MEDICATIONS. SHE SAYS  SHE CAN'T AFFORD TO BUY SOMETHING OVER THE COUNTER, BUT WOULD LIKE SOM[facility]ING [facility]ED [facility] [address], ALSO Ashlee [name] DIDN'T CALL IN HER ADVAIR 500, IT NEEDS TO CALLED IN AS WELL. Initial call taken by: Leodis Rains,  March 13, 2009 4:05 PM  Follow-up for Phone Call        pt says she is unable to get for nurse visit so she has medicad and medicare can we just call her something in.... pt says she doesn't know what med did it.... [facility]  pt says her dose was increased to 500 and she has almost ran out and needs rx for that too... Follow-up by: Arm[place] Shannon,  March 13, 2009 4:10 PM  Additional Follow-up for Phone Call Additional follow up Details #1::        Her advair dose was increased when she was sick.  She is supposed to go back to 250 dose after taking 500 for a month.    Additional Follow-up by: Tereso Newcomer PA-C,  March 13, 2009 9:16 PM    Additional Follow-up for Phone Call Additional follow up Details #2::    pt is aware Follow-up by: Armenia Shannon,  March 14, 2009 12:51 PM  New/Updated Medications: ADVAIR DISKUS 250-50 MCG/DOSE AEPB (FLUTICASONE-SALMETEROL) 1 puff two times a day MICONAZOLE 3 200 MG SUPP (MICONAZOLE NITRATE) 1 supp per vagina at bedtime x 3 nights Prescriptions: ADVAIR DISKUS 250-50 MCG/DOSE AEPB (FLUTICASONE-SALMETEROL) 1 puff two times a day  #1 x 11   Entered and Authorized by:   Tereso Newcomer PA-C   Signed by:   Tereso Newcomer PA-C on 03/13/2009  Method used:   [facility]        [facility]  [address] [phone]* (retail)       [address]       [place], [place]  [id number]       Ph: [phone] or [phone]       Fax: [phone]   RxID:   [id number] MICONAZOLE 3 200 MG SUPP (MICONAZOLE NITRATE) 1 supp per vagina at bedtime x 3 nights  #3 day supply x 0   Entered and Authorized by:   Tereso Newcomer PA-C   Signed by:   Tereso Newcomer PA-C on 03/13/2009   Method used:   [facility]        [facility]  [address] [phone]* (retail)       [address]       [place], [place]  [id number]       Ph: [phone] or [phone]       Fax: [phone]   RxID:   [id number]

## 2010-02-12 NOTE — Assessment & Plan Note (Signed)
Summary: np6/syncope/jml    Primary Provider:  Tereso Newcomer PA-C   History of Present Illness: 75 year old female in for evaluation of dyspnea and dizziness. Prior echocardiogram in December of 2010 showed normal LV function. A BNP at that time also was normal. Previous chest x-ray in January of 2011 showed COPD/emphysema. The patient has chronic dyspnea on exertion relieved with rest. It is not associated with chest pain. There is no orthopnea, PND, pedal edema, palpitations or exertional chest pain. In the past 1-1/2 years she has had occasional spells where she falls. She has dizziness predominantly when she stands. This precedes all of her falls although she denies syncope. There is no associated chest pain, palpitations, nausea, incontinence, seizure activity. Because of the above we were asked to further evaluate.  Current Medications (verified): 1)  Xanax 1 Mg  Tabs (Alprazolam) .... Take 1/2  Tablet By Mouth 4x A Day and 1 At Bedtime 2)  Paxil 20 Mg  Tabs (Paroxetine Hcl) .... Take 1 Tablet By Mouth Daily 3)  Spiriva Handihaler 18 Mcg  Caps (Tiotropium Bromide Monohydrate) .... Inhale Once Daily 4)  Eq Womans Laxative 5 Mg  Tbec (Bisacodyl) .... As Needed 5)  Proair Hfa 108 (90 Base) Mcg/act  Aers (Albuterol Sulfate) .... As Needed One To Two Puff Every 4 Hours As Needed 6)  Miralax   Powd (Polyethylene Glycol 3350) .Marland Kitchen.. 17g By Mouth Once Daily Constipation. Mix Powder With A Liquid and Drink 7)  Shower Chair .... Falls Risk 8)  Oxygen .... 2l At Bedtime 9)  Walker With Seat .... Needed For Gait and Balance Dysfunction 10)  Centrum Silver  Chew (Multiple Vitamins-Minerals) .Marland Kitchen.. 1 By Mouth Daily 11)  Fish Oil 1000 Mg Caps (Omega-3 Fatty Acids) .Marland Kitchen.. 1 By Mouth Daily 12)  Aspirin 325 Mg Tabs (Aspirin) .... Once Daily 13)  Caltrate 600+d Plus 600-400 Mg-Unit Tabs (Calcium Carbonate-Vit D-Min) .... Take 1 Tablet By Mouth Two Times A Day 14)  Actonel 150 Mg Tabs (Risedronate Sodium) .Marland Kitchen.. 1  By Mouth One Time Per Month 15)  Advair Diskus 250-50 Mcg/dose Aepb (Fluticasone-Salmeterol) .Marland Kitchen.. 1 Puff Two Times A Day 16)  Pravachol 40 Mg Tabs (Pravastatin Sodium) .... Take 1 Tab By Mouth At Bedtime For Cholesterol 17)  Lisinopril 10 Mg Tabs (Lisinopril) .... Take 1 Tablet By Mouth Once A Day  Allergies: 1)  ! * Fosmax 2)  ! * Aricept  Past History:  Past Medical History: HYPERTENSION (ICD-401.9) ASTHMA (ICD-493.90) DYSLIPIDEMIA (ICD-272.4) CAROTID ARTERY STENOSIS (ICD-433.10) TRANSAMINASES, SERUM, ELEVATED (ICD-790.4) CONTACT DERMATITIS (ICD-692.9) POLYNEUROPATHY OTHER DISEASES CLASSIFIED ELSW (ICD-357.4) FOLATE-DEFICIENCY ANEMIA (ICD-281.2) BREAST MASS, RIGHT (ICD-611.72) ACID REFLUX DISEASE (ICD-530.81) COLONIC POLYPS (ICD-211.3) FRACTURE RIGHT PUBIC RAMUS OSTEOPOROSIS NOS (ICD-733.00) ANEMIA, FOLATE-DEFICIENCY (ICD-281.2) LEUKOPLAKIA, VOCAL CORDS (ICD-478.5) SEIZURE DISORDER (ICD-780.39) PANIC ATTACK (ICD-300.01) COPD (ICD-496)   -FeV1 64%  DLCO 42% 2005  Past Surgical History: TUBAL LIGATION, HX OF (ICD-V26.51) BREAST BIOPSY, HX OF (ICD-V15.9) APPENDECTOMY, HX OF (ICD-V45.79) H/O c-section Cholecystectomy  Family History: Reviewed history from 07/31/2007 and no changes required. Mother died hemhorrage Father unknown Has 56 1/2-siblings daughters with thyroid problems. Son with MI in his 61's  Social History: Reviewed history from 07/31/2007 and no changes required. Lives with learning disabled daughter. Previous bartender. widowed Current Smoker Alcohol use-no  Review of Systems       Some pain in right lower extremity but no fevers or chills, productive cough, hemoptysis, dysphasia, odynophagia, melena, hematochezia, dysuria, hematuria, rash, seizure activity, orthopnea, PND, pedal edema, claudication. Remaining systems  are negative.   Vital Signs:  Patient profile:   75 year old female Height:      65 inches Weight:      141 pounds BMI:      23.55 Pulse rate:   90 / minute Pulse (ortho):   101 / minute Resp:     14 per minute BP sitting:   140 / 80  (left arm) BP standing:   137 / 72  Vitals Entered By: Kem Parkinson (May 19, 2009 11:13 AM)  Serial Vital Signs/Assessments:  Time      Position  BP       Pulse  Resp  Temp     By           Lying RA  148/78   92                    Kimalexis Barnes           Sitting   140/79   92                    Kimalexis Barnes           Standing  137/72   101                   Kimalexis Barnes   Physical Exam  General:  Well developed/somewhat frail in NAD Skin warm/dry Patient not depressed No peripheral clubbing Back-normal HEENT-normal/normal eyelids Neck supple/normal carotid upstroke bilaterally; right carotid bruit; no JVD; no thyromegaly chest - diminished breath sounds throughout/ increased AP diameter CV - RRR/normal S1 and S2; no murmurs, rubs or gallops; no rub; PMI nondisplaced Abdomen -NT/ND, no HSM, no mass, + bowel sounds, no bruit 2+ femoral pulses, left femoral bruit Ext-no edema, chords, 2+ DP; chronic skin changes Neuro-grossly nonfocal     EKG  Procedure date:  05/19/2009  Findings:      Normal sinus rhythm at a rate of 87. Right bundle branch block. Nonspecific ST changes.  Impression & Recommendations:  Problem # 1:  DYSPNEA (ICD-786.05) Patient has significant COPD. His is most likely responsible for her dyspnea. I will schedule a Myoview for risk stratification. If normal no further cardiac workup indicated. Her updated medication list for this problem includes:    Aspirin 325 Mg Tabs (Aspirin) ..... Once daily    Lisinopril 10 Mg Tabs (Lisinopril) .Marland Kitchen... Take 1 tablet by mouth once a day  Problem # 2:  CAROTID ARTERY STENOSIS (ICD-433.10) Continue aspirin. Carotids are being followed by her primary care physician. Continue statin. Her updated medication list for this problem includes:    Aspirin 325 Mg Tabs (Aspirin) ..... Once  daily  Problem # 3:  DYSLIPIDEMIA (ICD-272.4) Continue statin. Lipids and liver monitored by primary care. Her updated medication list for this problem includes:    Pravachol 40 Mg Tabs (Pravastatin sodium) .Marland Kitchen... Take 1 tab by mouth at bedtime for cholesterol  Problem # 4:  SMOKER (ICD-305.1) Patient counseled on discontinuing for between 3-10 minutes.  Problem # 5:  COPD (ICD-496) Management per pulmonary. Her updated medication list for this problem includes:    Spiriva Handihaler 18 Mcg Caps (Tiotropium bromide monohydrate) ..... Inhale once daily    Proair Hfa 108 (90 Base) Mcg/act Aers (Albuterol sulfate) .Marland Kitchen... As needed one to two puff every 4 hours as needed    Advair Diskus 250-50 Mcg/dose Aepb (Fluticasone-salmeterol) .Marland Kitchen... 1 puff two times a day  Problem # 6:  DIZZINESS (ICD-780.4) Symptoms sound to be orthostatic mediated. I've instructed her to increase her fluid intake as well as salt intake. Note she is not orthostatic in the office today. If her symptoms persist then we may need to consider decreasing her lisinopril. All of her falls have occurred after standing suddenly had been feeling dizzy. I have also instructed her to slowly change positions.  Problem # 7:  HYPERTENSION (ICD-401.9) Continue lisinopril for now. If her symptoms persist we may need to consider decreasing lisinopril. We will need to accept a higher blood pressure in the supine position versus to standing position to improve her orthostatic symptoms. Her updated medication list for this problem includes:    Aspirin 325 Mg Tabs (Aspirin) ..... Once daily    Lisinopril 10 Mg Tabs (Lisinopril) .Marland Kitchen... Take 1 tablet by mouth once a day  Her updated medication list for this problem includes:    Aspirin 325 Mg Tabs (Aspirin) ..... Once daily    Lisinopril 10 Mg Tabs (Lisinopril) .Marland Kitchen... Take 1 tablet by mouth once a day  Other Orders: Nuclear Stress Test (Nuc Stress Test)  Patient Instructions: 1)  Your  physician recommends that you schedule a follow-up appointment in: 8 weeks

## 2010-02-12 NOTE — Assessment & Plan Note (Signed)
Summary: Pulmonary OV   Primary Provider/Referring Provider:  Tereso Newcomer PA-C  CC:  2 mo follow up.  states breathing is doing well overall.  c/o "dry heaves" x1-2 wks and dizziness when standing up since this morning.  Marland Kitchen  History of Present Illness: Pulmonary:   75 yo WF with COPD and primary emphysema--current smoker  July 30, 2008 2:29 PM Pt improved since last oV.  Now living with daughter and no longer smoking.  Had frequent falls.  CT head was neg except for microvascular dz.  Now less dyspneic.   Pt denies any significant sore throat, nasal congestion or excess secretions, fever, chills, sweats, unintended weight loss, pleurtic or exertional chest pain, orthopnea PND, or leg swelling Pt denies any increase in rescue therapy over baseline, denies waking up needing it or having any early am or nocturnal exacerbations of coughing/wheezing/or dyspnea.  November 22, 2008 1:55 PM Pt notes since last ov doing ok, The pt is  walking more.  The pt is  now is finished with rehab. The pt now not much cough.  No wheeze.  No mucous. Pt denies any significant sore throat, nasal congestion or excess secretions, fever, chills, sweats, unintended weight loss, pleurtic or exertional chest pain, orthopnea PND, or leg swelling Pt denies any increase in rescue therapy over baseline, denies waking up needing it or having any early am or nocturnal exacerbations of coughing/wheezing/or dyspnea.   February 07, 2009 11:12 AM Since d/c dyspnea ok.  some cough and is productive white to yellow.  No real wheeze.  No chest pain.  Arm hurts on the R.    still smokes now on doxy and advair increased to 500 level July 30, 2008 2:29 PM 1)  No change in medications 2)  May use oxygen night time only 3)  Stop smoking 4)  Return 3 months  feels better vs 3/10 was falling ,  had CT head was neg except for microvasc diz now lives with the daughter better last two - weeks not smoking now    Preventive  Screening-Counseling & Management  Alcohol-Tobacco     Smoking Status: current  Current Medications (verified): 1)  Prempro 0.3-1.5 Mg  Tabs (Conj Estrog-Medroxyprogest Ace) .... Take 1 Tablet By Mouth Daily 2)  Xanax 1 Mg  Tabs (Alprazolam) .... Take 1/2  Tablet By Mouth 4x A Day and 1 At Bedtime 3)  Paxil 20 Mg  Tabs (Paroxetine Hcl) .... Take 1 Tablet By Mouth Daily 4)  Spiriva Handihaler 18 Mcg  Caps (Tiotropium Bromide Monohydrate) .... Inhale Once Daily 5)  Tylenol Extra Strength 500 Mg  Tabs (Acetaminophen) .... As Needed 6)  Eq Womans Laxative 5 Mg  Tbec (Bisacodyl) .... As Needed 7)  Proair Hfa 108 (90 Base) Mcg/act  Aers (Albuterol Sulfate) .... As Needed One To Two Puff Every 4 Hours As Needed 8)  Miralax   Powd (Polyethylene Glycol 3350) .Marland Kitchen.. 17g By Mouth Once Daily Constipation. Mix Powder With A Liquid and Drink 9)  Shower Chair .... Falls Risk 10)  Oxygen .... At Bedtime 11)  Walker With Seat .... Needed For Gait and Balance Dysfunction 12)  Centrum Silver  Chew (Multiple Vitamins-Minerals) .Marland Kitchen.. 1 By Mouth Daily 13)  Fish Oil 1000 Mg Caps (Omega-3 Fatty Acids) .Marland Kitchen.. 1 By Mouth Daily 14)  Aspirin 325 Mg Tabs (Aspirin) .... Once Daily 15)  Caltrate 600+d Plus 600-400 Mg-Unit Tabs (Calcium Carbonate-Vit D-Min) .... Take 1 Tablet By Mouth Two Times A Day 16)  Actonel 150 Mg Tabs (Risedronate Sodium) .Marland Kitchen.. 1 By Mouth One Time Per Month 17)  Doxycycline Hyclate 100 Mg Tabs (Doxycycline Hyclate) .... Take 1 Tablet By Mouth Two Times A Day Until All Gone 18)  Omeprazole 40 Mg Cpdr (Omeprazole) .... Take 1 Tablet By Mouth Once A Day For Stomach Acid 19)  Advair Diskus 500-50 Mcg/dose Aepb (Fluticasone-Salmeterol) .... Take One Puff Two Times A Day  Allergies (verified): 1)  ! * Fosmax 2)  ! * Aricept  Past History:  Past medical, surgical, family and social histories (including risk factors) reviewed, and no changes noted (except as noted below).  Past Medical History: Reviewed  history from 08/23/2007 and no changes required.  COLONIC POLYPS (ICD-211.3) OSTEOPOROSIS NOS (ICD-733.00) ANEMIA, FOLATE-DEFICIENCY (ICD-281.2) LEUKOPLAKIA, VOCAL CORDS (ICD-478.5) SEIZURE DISORDER (ICD-780.39) PANIC ATTACK (ICD-300.01) POST TRAUMATIC STRESS SYNDROME (ICD-309.81) SMOKER (ICD-305.1) BRONCHITIS, RECURRENT (ICD-491.9) COPD (ICD-496)   -FeV1 64%  DLCO 42% 2005 BRONCHOPNEUMONIA (ICD-485)  Past Surgical History: Reviewed history from 09/01/2006 and no changes required. Current Problems:  COLONIC POLYPS (ICD-211.3) TUBAL LIGATION, HX OF (ICD-V26.51) BREAST BIOPSY, HX OF (ICD-V15.9) APPENDECTOMY, HX OF (ICD-V45.79) * FRACTURE RIGHT PUBIC RAMUS  Family History: Reviewed history from 07/31/2007 and no changes required. Mother died hemhorrage Father unknown Has 39 1/2-siblings daughters with thyroid problems.  Social History: Reviewed history from 07/31/2007 and no changes required. Lives with learning disabled daughter.Previous bartender. widowed Current Smoker Alcohol use-no  Review of Systems       The patient complains of shortness of breath with activity.  The patient denies shortness of breath at rest, productive cough, non-productive cough, coughing up blood, chest pain, irregular heartbeats, acid heartburn, indigestion, loss of appetite, weight change, abdominal pain, difficulty swallowing, sore throat, tooth/dental problems, headaches, nasal congestion/difficulty breathing through nose, sneezing, itching, ear ache, anxiety, depression, hand/feet swelling, joint stiffness or pain, rash, change in color of mucus, and fever.    Vital Signs:  Patient profile:   75 year old female Height:      65 inches Weight:      144.13 pounds BMI:     24.07 O2 Sat:      92 % on Room air Temp:     97.9 degrees F oral Pulse rate:   114 / minute BP sitting:   134 / 72  (left arm) Cuff size:   regular  Vitals Entered By: Gweneth Dimitri RN (February 07, 2009 10:57  AM)  O2 Flow:  Room air CC: 2 mo follow up.  states breathing is doing well overall.  c/o "dry heaves" x1-2 wks and dizziness when standing up since this morning.   Comments Medications reviewed with patient Daytime contact number verified with patient. Gweneth Dimitri RN  February 07, 2009 10:57 AM    Physical Exam  Additional Exam:  Gen: Pleasant, well nourished, in no distress ENT: no lesions, no postnasal drip Neck: No JVD, no TMG, no carotid bruits Lungs: no use of accessory muscles,distant BS,  poor airflow Cardiovascular: RRR, heart sounds normal, no murmurs or gallops, no peripheral edema Musculoskeletal: No deformities, no cyanosis or clubbing     CXR  Procedure date:  02/07/2009  Findings:      Findings: Improved aeration of the right lung when compared the prior exam.  The ill-defined density in the right lung has resolved.  Normal cardiomediastinal silhouette.  Bony thorax intact.  Mild spondylotic changes.  Diffuse bony demineralization.   IMPRESSION: COPD/emphysema.  Improved right lung aeration.    Impression & Recommendations:  Problem # 1:  COPD (ICD-496) Assessment Unchanged  s/p admxn 01/07/09-01/11/09 for exacerbation  now improved cxr stable plan No change in inhaled medications.   Maintain treatment program as currently prescribed.   Her updated medication list for this problem includes:    Spiriva Handihaler 18 Mcg Caps (Tiotropium bromide monohydrate) ..... Inhale once daily    Advair Diskus 250-50 Mcg/dose Misc (Fluticasone-salmeterol) ..... Use 1 puff two times a day    Proair Hfa 108 (90 Base) Mcg/act Aers (Albuterol sulfate) .Marland Kitchen... As needed one to two puff every 4 hours as needed  Problem # 2:  SMOKER (ICD-305.1) Assessment: Unchanged ongoing tobacco abuse plan smoking cessation  Medications Added to Medication List This Visit: 1)  Prednisone 10 Mg Tabs (Prednisone) .... Take as directed 4 each am x3days, 3 x 3days, 2 x 3days, 1 x  3days then stop  Complete Medication List: 1)  Prempro 0.3-1.5 Mg Tabs (Conj estrog-medroxyprogest ace) .... Take 1 tablet by mouth daily 2)  Xanax 1 Mg Tabs (alprazolam)  .... Take 1/2  tablet by mouth 4x a day and 1 at bedtime 3)  Paxil 20 Mg Tabs (Paroxetine hcl) .... Take 1 tablet by mouth daily 4)  Spiriva Handihaler 18 Mcg Caps (Tiotropium bromide monohydrate) .... Inhale once daily 5)  Tylenol Extra Strength 500 Mg Tabs (Acetaminophen) .... As needed 6)  Eq Womans Laxative 5 Mg Tbec (Bisacodyl) .... As needed 7)  Proair Hfa 108 (90 Base) Mcg/act Aers (Albuterol sulfate) .... As needed one to two puff every 4 hours as needed 8)  Miralax Powd (Polyethylene glycol 3350) .Marland Kitchen.. 17g by mouth once daily constipation. mix powder with a liquid and drink 9)  Paediatric nurse  .... Falls risk 10)  Oxygen  .... At bedtime 11)  Walker With Seat  .... Needed for gait and balance dysfunction 12)  Centrum Silver Chew (Multiple vitamins-minerals) .Marland Kitchen.. 1 by mouth daily 13)  Fish Oil 1000 Mg Caps (Omega-3 fatty acids) .Marland Kitchen.. 1 by mouth daily 14)  Aspirin 325 Mg Tabs (Aspirin) .... Once daily 15)  Caltrate 600+d Plus 600-400 Mg-unit Tabs (Calcium carbonate-vit d-min) .... Take 1 tablet by mouth two times a day 16)  Actonel 150 Mg Tabs (Risedronate sodium) .Marland Kitchen.. 1 by mouth one time per month 17)  Doxycycline Hyclate 100 Mg Tabs (Doxycycline hyclate) .... Take 1 tablet by mouth two times a day until all gone 18)  Omeprazole 40 Mg Cpdr (Omeprazole) .... Take 1 tablet by mouth once a day for stomach acid 19)  Advair Diskus 500-50 Mcg/dose Aepb (Fluticasone-salmeterol) .... Take one puff two times a day 20)  Prednisone 10 Mg Tabs (Prednisone) .... Take as directed 4 each am x3days, 3 x 3days, 2 x 3days, 1 x 3days then stop  Other Orders: Est. Patient Level IV (99214) T-2 View CXR (71020TC)  Patient Instructions: 1)  A chest xray will be obtained 2)  Stop smoking 3)  Finish doxycycline 4)  Take prednisone 10mg   4 each am x3days, 3 x 3days, 2 x 3days, 1 x 3days then stop 5)  No change in inhalers 6)  Return one month Prescriptions: PREDNISONE 10 MG  TABS (PREDNISONE) Take as directed 4 each am x3days, 3 x 3days, 2 x 3days, 1 x 3days then stop  #30 x 0   Entered and Authorized by:   Storm Frisk MD   Signed by:   Storm Frisk MD on 02/07/2009   Method used:   Print then Give to Patient  RxID:   [id number]

## 2010-02-12 NOTE — Assessment & Plan Note (Signed)
Summary: blood pressure check and bmet // tl   Nurse Visit   Vital Signs:  Patient profile:   75 year old female Pulse rate:   88 / minute Pulse rhythm:   regular Resp:     20 per minute BP sitting:   124 / 72  (right arm)  Vitals Entered By: Dutch Quint RN (August 22, 2009 3:16 PM)  Impression & Recommendations:  Problem # 1:  HYPERTENSION (ICD-401.9)  Orders: T-Basic Metabolic Panel [phone])  Complete Medication List: 1)  Xanax 1 Mg Tabs (Alprazolam) .... 1/2 tab by mouth 4 times daily as needed 2)  Paxil 20 Mg Tabs (Paroxetine hcl) .... Take 1 tablet by mouth daily 3)  Spiriva Handihaler 18 Mcg Caps (Tiotropium bromide monohydrate) .... Inhale once daily 4)  Eq Womans Laxative 5 Mg Tbec (Bisacodyl) .... As needed 5)  Miralax Powd (Polyethylene glycol 3350) .Marland Kitchen.. 17g by mouth once daily constipation. mix powder with a liquid and drink 6)  Paediatric nurse  .... Falls risk 7)  Oxygen  .... 2l at bedtime 8)  Walker With Seat  .... Needed for gait and balance dysfunction 9)  Centrum Silver Chew (Multiple vitamins-minerals) .Marland Kitchen.. 1 by mouth daily 10)  Fish Oil 1000 Mg Caps (Omega-3 fatty acids) .Marland Kitchen.. 1 by mouth daily 11)  Aspirin 81 Mg Tbec (Aspirin) .... Take one tablet by mouth daily 12)  Caltrate 600+d Plus 600-400 Mg-unit Tabs (Calcium carbonate-vit d-min) .... Take 1 tablet by mouth two times a day 13)  Actonel 150 Mg Tabs (Risedronate sodium) .Marland Kitchen.. 1 by mouth one time per month 14)  Advair Diskus 250-50 Mcg/dose Aepb (Fluticasone-salmeterol) .Marland Kitchen.. 1 puff two times a day 15)  Omeprazole 40 Mg Cpdr (Omeprazole) .... Take 1 capsule by mouth once a day for stomach acid 16)  Simvastatin 40 Mg Tabs (Simvastatin) .... Take 1 tab by mouth at bedtime   Patient Instructions: 1)  Reviewed with Jesse Fall. 2)  Blood pressure is good. 3)  Keep scheduled appts. -- call if anything changes.   Primary Care Provider:  Tereso Newcomer PA-C  CC:  repeat bp check and BMET.  History of  Present Illness: States that since she quit the lisinopril, she eats better, feels better overall.    Physical Exam  General:  alert, well-hydrated, and underweight appearing.    CC: repeat bp check and BMET Pain Assessment Patient in pain? no        Allergies: 1)  ! * Fosmax 2)  ! * Aricept  Orders Added: 1)  Est. Patient Level I [95621] 2)  T-Basic Metabolic Panel [[id number]]

## 2010-02-12 NOTE — Consult Note (Signed)
Summary: [facility]   Imported By: Marylou Mccoy 06/17/2009 10:18:01  _____________________________________________________________________  External Attachment:    Type:   Image     Comment:   External Document

## 2010-02-12 NOTE — Assessment & Plan Note (Signed)
Summary: feet and legs swollen///cns   Vital Signs:  Patient profile:   75 year old female Height:      65 inches Weight:      144 pounds BMI:     24.05 Temp:     97.7 degrees F Pulse rate:   103 / minute Pulse rhythm:   regular Resp:     18 per minute BP supine:   160 / 82  (right arm) BP sitting:   156 / 97  (left arm) BP standing:   142 / 82  (left arm) Cuff size:   regular  Vitals Entered By: Armenia Shannon (April 16, 2009 10:58 AM)  Serial Vital Signs/Assessments:  Time      Position  BP       Pulse  Resp  Temp     By 11:38 AM            140/80   100                   Armenia Shannon  CC: pt here for swelling in legs and feet... Is Patient Diabetic? No Pain Assessment Patient in pain? no       Does patient need assistance? Functional Status Self care Ambulation Normal   Primary Care Provider:  Tereso Newcomer PA-C  CC:  pt here for swelling in legs and feet....  History of Present Illness: 75 year old female presents with complaints of lower extremity swelling.  She was taken off of her blood pressure medicines when she was admitted to the hospital with COPD exacerbation a couple of months ago.  She had some transient acute renal insufficiency as well as elevated hepatic enzymes.  Her numbers returned to normal post discharge.  I put her back on her Norvasc and recently increased from 5 mg to 10 mg due to uncontrolled blood pressure.  She notes that she came in yesterday for blood work and noticed that her legs were swelling when she got home in the afternoon.  She had not noticed this before.  The swelling is bilateral.  She denies any recent trips or injuries.  She denies any chest pain or syncope.  However, she does note 2 falls recently.  She describes a feeling of lightheadedness before each fall.  She  states that she feels like she may pass out.  She denies any palpitations.  She struck her chest when she fell yesterday and has noticed some pain on that side since  then.  She did not hit her head or lose consciousness.  She denies any bruising.  She denies any near syncope while sitting still.  She denies orthopnea, paroxysmal nocturnal dyspnea.  She denies any substernal chest heaviness or tightness with exertion.  She continues to smoke.  She drinks multiple cups of coffee and tea during the day.  She does not drink any water.  Her creatinine has been somewhat elevated recently and I have been following it.  Problems Prior to Update: 1)  Edema  (ICD-782.3) 2)  Dizziness  (ICD-780.4) 3)  Uti  (ICD-599.0) 4)  Hormone Replacement Therapy  (ICD-V07.4) 5)  Dyslipidemia  (ICD-272.4) 6)  Carotid Artery Stenosis  (ICD-433.10) 7)  Transaminases, Serum, Elevated  (ICD-790.4) 8)  Anemia, Normocytic  (ICD-285.9) 9)  Essential Hypertension, Benign  (ICD-401.1) 10)  Contact Dermatitis  (ICD-692.9) 11)  Tick Bite  (ICD-E906.4) 12)  Gait Disturbance  (ICD-781.2) 13)  Syncope  (ICD-780.2) 14)  Polyneuropathy Other Diseases Classified [place]  (ICD-357.4)  15)  Screening For Malignant Neoplasm, Cervix  (ICD-V76.2) 16)  Folate-deficiency Anemia  (ICD-281.2) 17)  Breast Mass, Right  (ICD-611.72) 18)  Sinusitis  (ICD-473.9) 19)  Labrynthitis  (ICD-386.30) 20)  Bronchitis, Obstructive Chronic, With Exacerbation  (ICD-491.21) 21)  Acid Reflux Disease  (ICD-530.81) 22)  Health Maintenance Exam  (ICD-V70.0) 23)  Colonic Polyps  (ICD-211.3) 24)  Tubal Ligation, Hx of  (ICD-V26.51) 25)  Breast Biopsy, Hx of  (ICD-V15.9) 26)  Appendectomy, Hx of  (ICD-V45.79) 27)  Fracture Right Pubic Ramus  () 28)  Osteoporosis Nos  (ICD-733.00) 29)  Anemia, Folate-deficiency  (ICD-281.2) 30)  Leukoplakia, 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 Take 1/2  Tablet By Mouth 4x A Day and 1 At Bedtime 2)  Paxil 20 Mg  Tabs (Paroxetine Hcl) .... Take 1 Tablet By Mouth Daily 3)  Spiriva Handihaler 18 Mcg  Caps (Tiotropium Bromide Monohydrate) .... Inhale Once Daily 4)  Tylenol Extra Strength 500 Mg  Tabs (Acetaminophen) .... As Needed 5)  Eq Womans Laxative 5 Mg  Tbec (Bisacodyl) .... As Needed 6)  Proair Hfa 108 (90 Base) Mcg/act  Aers (Albuterol Sulfate) .... As Needed One To Two Puff Every 4 Hours As Needed 7)  Miralax   Powd (Polyethylene Glycol 3350) .Marland Kitchen.. 17g By Mouth Once Daily Constipation. Mix Powder With A Liquid and Drink 8)  Shower Chair .... Falls Risk 9)  Oxygen .... At Bedtime 10)  Walker With Seat .... Needed For Gait and Balance Dysfunction 11)  Centrum Silver  Chew (Multiple Vitamins-Minerals) .Marland Kitchen.. 1 By Mouth Daily 12)  Fish Oil 1000 Mg Caps (Omega-3 Fatty Acids) .Marland Kitchen.. 1 By Mouth Daily 13)  Aspirin 325 Mg Tabs (Aspirin) .... Once Daily 14)  Caltrate 600+d Plus 600-400 Mg-Unit Tabs (Calcium Carbonate-Vit D-Min) .... Take 1 Tablet By Mouth Two Times A Day 15)  Actonel 150 Mg Tabs (Risedronate Sodium) .Marland Kitchen.. 1 By Mouth One Time Per Month 16)  Omeprazole 40 Mg Cpdr (Omeprazole) .... Take 1 Tablet By Mouth Once A Day For Stomach Acid 17)  Advair Diskus 250-50 Mcg/dose Aepb (Fluticasone-Salmeterol) .Marland Kitchen.. 1 Puff Two Times A Day 18)  Prednisone 10 Mg  Tabs (Prednisone) .... Take As Directed 4 Each Am X3days, 3 X 3days, 2 X 3days, 1 X 3days Then Stop 19)  Pravachol 40 Mg Tabs (Pravastatin Sodium) .... Take 1 Tab By Mouth At Bedtime For Cholesterol 20)  Norvasc 10 Mg Tabs (Amlodipine Besylate) .Marland Kitchen.. 1 By Mouth Once Daily 21)  Miconazole 3 200 Mg Supp (Miconazole Nitrate) .Marland Kitchen.. 1 Supp Per Vagina At Bedtime X 3 Nights  Allergies (verified): 1)  ! * Fosmax 2)  ! * Aricept  Past History:  Past Medical History: Last updated: 08/23/2007  COLONIC POLYPS  (ICD-211.3) OSTEOPOROSIS NOS (ICD-733.00) ANEMIA, FOLATE-DEFICIENCY (ICD-281.2) LEUKOPLAKIA, VOCAL CORDS (ICD-478.5) SEIZURE DISORDER (ICD-780.39) PANIC ATTACK (ICD-300.01) POST TRAUMATIC STRESS SYNDROME (ICD-309.81) SMOKER (ICD-305.1) BRONCHITIS, RECURRENT (ICD-491.9) COPD (ICD-496)   -FeV1 64%  DLCO 42% 2005 BRONCHOPNEUMONIA (ICD-485)  Social History: Reviewed history from 07/31/2007 and no changes required. Lives with learning disabled daughter.Previous bartender. widowed Current Smoker Alcohol use-no  Review of Systems      See HPI General:  Denies chills and fever.  Resp:  Denies cough and coughing up blood. GI:  Denies bloody stools, dark tarry stools, and diarrhea. GU:  Complains of dysuria; denies hematuria.  Physical Exam  General:  alert, well-developed, and well-nourished.   Head:  normocephalic and atraumatic.   Neck:  supple.   no jvd at 90 degrees Lungs:  decreased breath sounds no wheezing or rales Heart:  normal rate and regular rhythm.   Msk:  calves soft bilat and no palpable cords Extremities:  trace left pedal edema and trace right pedal edema.   Neurologic:  alert & oriented X3 and cranial nerves II-XII intact.   Psych:  normally interactive.     Impression & Recommendations:  Problem # 1:  UTI (ICD-599.0)  still having dysuria repeat U/A and culture advised her to cut down on irritants (ie caffeine) Orders: T-Urinalysis (28413-24401) T-Culture, Urine ([id number])  Problem # 2:  DIZZINESS (ICD-780.4)  she does have some drop in her BP from lying to standing her HR does not change (100, 98, 100) she is likely dehydrated as she drinks multiple cups of coffee and tea every day and does not drink water I have asked her to push fluids and drink plenty of water she should use caution when going lying to standing.  she has an abnormal ekg with LAFB and RBBB I doubt she is having a bradyarrhythmia causing her near syncope and falls her echo  demonstrated normal LVF in July however, I want her to go ahead and see cardiology to evaluate she is certainly at risk for ischemic heart disease with smoking, age, HTN and high chol  Orders: Cardiology Referral (Cardiology)  Problem # 3:  EDEMA (ICD-782.3) likely related to the norvasc will change bp meds  Problem # 4:  ESSENTIAL HYPERTENSION, BENIGN (ICD-401.1) she did better on norvasc 5 and lisinopril 10 will hold norvasc restart Lisinopril she has f/u with me in 2 weeks keep that appt and will reassess then  The following medications were removed from the medication list:    Norvasc 10 Mg Tabs (Amlodipine besylate) .Marland Kitchen... 1 by mouth once daily Her updated medication list for this problem includes:    Lisinopril 10 Mg Tabs (Lisinopril) .Marland Kitchen... Take 1 tablet by mouth once a day  Problem # 5:  GAIT DISTURBANCE (ICD-781.2) she saw PT last year for balance training suspect this may be playing a role as well as her w/u is completed, will consider sending her back to PT  Complete Medication List: 1)  Xanax 1 Mg Tabs (alprazolam)  .... Take 1/2  tablet by mouth 4x a day and 1 at bedtime 2)  Paxil 20 Mg Tabs (Paroxetine hcl) .... Take 1 tablet by mouth daily 3)  Spiriva Handihaler 18 Mcg Caps (Tiotropium bromide monohydrate) .... Inhale once daily 4)  Tylenol Extra Strength 500 Mg Tabs (Acetaminophen) .... As needed 5)  Eq Womans Laxative 5 Mg Tbec (Bisacodyl) .... As needed 6)  Proair Hfa 108 (90 Base) Mcg/act Aers (Albuterol sulfate) .... As needed one to two puff every 4 hours as needed 7)  Miralax Powd (Polyethylene glycol 3350) .Marland Kitchen.. 17g by mouth once daily constipation. mix powder with a liquid and drink 8)  Paediatric nurse  .... Falls risk 9)  Oxygen  .... At bedtime 10)  Walker With Seat  .... Needed for gait and balance dysfunction 11)  Centrum Silver Chew (Multiple vitamins-minerals) .Marland Kitchen.. 1 by mouth daily 12)  Fish Oil 1000 Mg Caps (Omega-3 fatty acids) .Marland Kitchen.. 1 by mouth  daily 13)  Aspirin 325 Mg Tabs (Aspirin) .... Once daily 14)  Caltrate 600+d Plus 600-400 Mg-unit Tabs (Calcium carbonate-vit d-min) .... Take 1 tablet by mouth two times a day 15)  Actonel 150 Mg Tabs (Risedronate sodium) .Marland Kitchen.. 1 by mouth one time per month 16)  Omeprazole 40 Mg Cpdr (Omeprazole) .... Take 1 tablet by mouth once a day for stomach acid 17)  Advair Diskus 250-50 Mcg/dose Aepb (Fluticasone-salmeterol) .Marland Kitchen.. 1 puff two times a day 18)  Prednisone 10 Mg Tabs (Prednisone) .... Take as directed 4 each am x3days, 3 x 3days, 2 x 3days, 1 x 3days then stop 19)  Pravachol 40 Mg Tabs (Pravastatin sodium) .... Take 1 tab by mouth at bedtime for cholesterol 20)  Miconazole 3 200 Mg Supp (Miconazole nitrate) .Marland Kitchen.. 1 supp per vagina at bedtime x 3 nights 21)  Lisinopril 10 Mg Tabs (Lisinopril) .... Take 1 tablet by mouth once a day  Patient Instructions: 1)  Stop the norvasc. 2)  I [facility] to [facility] on [address] for Lisinopril.  Start this for your Blood Pressure. 3)  Cut back on caffeine. 4)  Drink more water (6-8 eight ounce glasses of water per day). 5)  Use caution when going from lying or sitting to standing.  Get up slowly.  Make sure you have something to hold on to.  If you have a can or walker, you should start using these again to keep you from falling. 6)  Call me if your swelling gets worse. 7)  I think it is coming from the norvasc. 8)  Keep your follow up appointment with Tika Hannis this month. Prescriptions: LISINOPRIL 10 MG TABS (LISINOPRIL) Take 1 tablet by mouth once a day  #30 x 5   Entered and Authorized by:   Tereso Newcomer PA-C   Signed by:   Tereso Newcomer PA-C on 04/16/2009   Method used:   Electronically to        [facility] [address] [phone]* (retail)       [address]       [place], [place]  [id number]       Ph: [phone] or [phone]       Fax: [phone]   RxID:    [id number]    Echocardiogram  Procedure date:  07/31/2008  Findings:      Study Conclusions            1. Left ventricle: The cavity size was normal. Wall thickness was        normal. Systolic function was normal. The estimated ejection        fraction was in the range of 55% to 60%. Doppler parameters are        consistent with abnormal left ventricular relaxation (grade 1        diastolic dysfunction).     2. Atrial septum: There was increased thickness of the septum,        consistent with lipomatous hypertrophy.     3. Impressions: Technically limited study due to poor acoustic        images.  EKG  Procedure date:  04/16/2009  Findings:      Normal sinus rhythm with rate of:  97 LAD RBBB no significant change since previous tracing  Laboratory Results   Urine Tests    Routine Urinalysis   Glucose: negative   (Normal Range: Negative) Bilirubin: negative   (Normal  Range: Negative) Ketone: negative   (Normal Range: Negative) Spec. Gravity: 1.010   (Normal Range: 1.003-1.035) Blood: negative   (Normal Range: Negative) pH: 6.0   (Normal Range: 5.0-8.0) Protein: negative   (Normal Range: Negative) Urobilinogen: 0.2   (Normal Range: 0-1) Nitrite: negative   (Normal Range: Negative) Leukocyte Esterace: negative   (Normal Range: Negative)

## 2010-02-12 NOTE — Progress Notes (Signed)
Summary: Stay off ACE inhibitors   Phone Note Outgoing Call   Summary of Call: Creatinine looks better. I want her to remain off the Lisinopril.  Initial call taken by: Brynda Rim,  August 31, 2009 9:22 PM  Follow-up for Phone Call        Pt notified. Follow-up by: Vesta Mixer CMA,  September 01, 2009 10:23 AM   New Allergies: ! LISINOPRIL New Allergies: ! LISINOPRIL

## 2010-02-12 NOTE — Assessment & Plan Note (Signed)
Summary: Bil leg pain   Vital Signs:  Patient profile:   75 year old female Height:      65 inches Weight:      147.1 pounds BMI:     24.57 Temp:     97.8 degrees F oral Pulse rate:   84 / minute Pulse rhythm:   regular Resp:     20 per minute BP sitting:   142 / 84  (left arm)  Vitals Entered By: CMA Student Linzie Collin CC: pain in lower legs, ongoing, intense in the last week, OTC Bayer asprin, Hypertension Management Is Patient Diabetic? No Pain Assessment Patient in pain? yes     Location: lower legs Intensity: 8 Type: aching  Does patient need assistance? Functional Status Self care Ambulation Normal   Primary Care Provider:  Tereso Newcomer PA-C  CC:  pain in lower legs, ongoing, intense in the last week, OTC Bayer asprin, and Hypertension Management.  History of Present Illness:  Pt into the office with complaints of pain in both legs Started at least 6 months ago Starts from knees and radiates down into her food Right worse than left Denies any recent trauma - admits that she used to have accidental falls all the time Took Bayer Aspirin on last night for pain. Pain in legs get worse when she walks but in front of legs She is unable to ambulate for extended periods of time without resting.  But pain does NOT improve with rest. +tobacco abuse -DVT +hypercholesterolemia +discoloration in toes at night  Social - pt lives at home with her daughter who presents today in office with her.    Hypertension History:      She denies headache, chest pain, and palpitations.  She notes no problems with any antihypertensive medication side effects.        Positive major cardiovascular risk factors include female age 29 years old or older, hyperlipidemia, hypertension, and current tobacco user.     Habits & Providers  Alcohol-Tobacco-Diet     Alcohol drinks/day: 0     Tobacco Status: current     Tobacco Counseling: to quit use of tobacco products  Cigarette Packs/Day: <0.25     Year Started: 1950s     Pack years: 79  Exercise-Depression-Behavior     Does Patient Exercise: yes     Type of exercise: walk     Exercise (avg: min/session): 7:30     Have you felt down or hopeless? no     Have you felt little pleasure in things? no     Depression Counseling: not indicated; screening negative for depression     Drug Use: no  Current Medications (verified): 1)  Xanax 1 Mg Tabs (Alprazolam) .... 1/2 Tab By Mouth 4 Times Daily As Needed 2)  Paxil 20 Mg  Tabs (Paroxetine Hcl) .... Take 1 Tablet By Mouth Daily 3)  Spiriva Handihaler 18 Mcg  Caps (Tiotropium Bromide Monohydrate) .... Inhale Once Daily 4)  Eq Womans Laxative 5 Mg  Tbec (Bisacodyl) .... As Needed 5)  Miralax   Powd (Polyethylene Glycol 3350) .Marland Kitchen.. 17g By Mouth Once Daily Constipation. Mix Powder With A Liquid and Drink 6)  Paediatric nurse .... Falls Risk 7)  Oxygen .... 2l At Bedtime 8)  Walker With Seat .... Needed For Gait and Balance Dysfunction 9)  Centrum Silver  Chew (Multiple Vitamins-Minerals) .Marland Kitchen.. 1 By Mouth Daily 10)  Fish Oil 1000 Mg Caps (Omega-3 Fatty Acids) .Marland Kitchen.. 1 By Mouth Daily 11)  Aspirin 81 Mg Tbec (Aspirin) .... Take One Tablet By Mouth Daily 12)  Caltrate 600+d Plus 600-400 Mg-Unit Tabs (Calcium Carbonate-Vit D-Min) .... Take 1 Tablet By Mouth Two Times A Day 13)  Actonel 150 Mg Tabs (Risedronate Sodium) .Marland Kitchen.. 1 By Mouth One Time Per Month 14)  Advair Diskus 250-50 Mcg/dose Aepb (Fluticasone-Salmeterol) .Marland Kitchen.. 1 Puff Two Times A Day 15)  Omeprazole 40 Mg Cpdr (Omeprazole) .... Take 1 Capsule By Mouth Once A Day For Stomach Acid 16)  Pravastatin Sodium 80 Mg Tabs (Pravastatin Sodium) .Marland Kitchen.. 1 Tab By Mouth Daily  Allergies (verified): 1)  ! * Fosmax 2)  ! * Aricept 3)  ! Lisinopril  Review of Systems General:  Denies fever. CV:  Denies chest pain or discomfort. Resp:  Denies cough. GI:  Denies abdominal pain, nausea, and vomiting.  Physical Exam  General:   alert.   Head:  normocephalic.   Lungs:  normal breath sounds.   Heart:  normal rate and regular rhythm.   Neurologic:  alert & oriented X3.   Skin:  color normal.   Psych:  Oriented X3.     Knee Exam  Knee Exam:    Right:    Inspection:  Abnormal    Stability:  stable    arthritic changes    Left:    Inspection:  Abnormal    Palpation:  Abnormal    Stability:  stable    arthritic changes   Impression & Recommendations:  Problem # 1:  LEG PAIN, BILATERAL (ICD-729.5) will order x-rays possible dx is PVD - reviewed with pt results Orders: Radiology other (Radiology Other)  Complete Medication List: 1)  Xanax 1 Mg Tabs (Alprazolam) .... 1/2 tab by mouth 4 times daily as needed 2)  Paxil 20 Mg Tabs (Paroxetine hcl) .... Take 1 tablet by mouth daily 3)  Spiriva Handihaler 18 Mcg Caps (Tiotropium bromide monohydrate) .... Inhale once daily 4)  Eq Womans Laxative 5 Mg Tbec (Bisacodyl) .... As needed 5)  Miralax Powd (Polyethylene glycol 3350) .Marland Kitchen.. 17g by mouth once daily constipation. mix powder with a liquid and drink 6)  Paediatric nurse  .... Falls risk 7)  Oxygen  .... 2l at bedtime 8)  Walker With Seat  .... Needed for gait and balance dysfunction 9)  Centrum Silver Chew (Multiple vitamins-minerals) .Marland Kitchen.. 1 by mouth daily 10)  Fish Oil 1000 Mg Caps (Omega-3 fatty acids) .Marland Kitchen.. 1 by mouth daily 11)  Aspirin 81 Mg Tbec (Aspirin) .... Take one tablet by mouth daily 12)  Caltrate 600+d Plus 600-400 Mg-unit Tabs (Calcium carbonate-vit d-min) .... Take 1 tablet by mouth two times a day 13)  Actonel 150 Mg Tabs (Risedronate sodium) .Marland Kitchen.. 1 by mouth one time per month 14)  Advair Diskus 250-50 Mcg/dose Aepb (Fluticasone-salmeterol) .Marland Kitchen.. 1 puff two times a day 15)  Omeprazole 40 Mg Cpdr (Omeprazole) .... Take 1 capsule by mouth once a day for stomach acid 16)  Pravastatin Sodium 80 Mg Tabs (Pravastatin sodium) .Marland Kitchen.. 1 tab by mouth daily  Hypertension Assessment/Plan:      The patient's  hypertensive risk group is category B: At least one risk factor (excluding diabetes) with no target organ damage.  Her calculated 10 year risk of coronary heart disease is 17 %.  Today's blood pressure is 142/84.  Her blood pressure goal is < 140/90.  Patient Instructions: 1)  You will need to get x-rays of your knees 2)  Take the order form with you and they will send  the results to this office. 3)  Causes of leg pain include 4)  Problem with the knees - will get x-ray results. 5)  Also problem may be circulation - continue the aspirin daily 6)  It is very important that you take your cholesterol medications as ordered.   7)  Also be advised that smoking leads to hardening of the vessels - try to decrease smoking. 8)  Follow up in 1 week for x-ray results   Orders Added: 1)  Est. Patient Level III [82956] 2)  Radiology other [Radiology Other]

## 2010-02-12 NOTE — Letter (Signed)
[facility]   Imported By: Arta Bruce 08/25/2009 16:10:52  _____________________________________________________________________  External Attachment:    Type:   Image     Comment:   External Document

## 2010-02-12 NOTE — Miscellaneous (Signed)
Summary: Mammo Normal   Clinical Lists Changes  Observations: Added new observation of MAMMRECACT: Screening mammogram in 1 year.    (06/24/2009 16:40) Added new observation of MAMMOGRAM: No specific mammographic evidence of malignancy.  Assessment: BIRADS 2.  (06/24/2009 16:40)      Mammogram  Procedure date:  06/24/2009  Findings:      No specific mammographic evidence of malignancy.  Assessment: BIRADS 2.   Comments:      Screening mammogram in 1 year.

## 2010-02-12 NOTE — Progress Notes (Signed)
   Phone Note Outgoing Call   Summary of Call: Patient needs post hospital follow up. Initial call taken by: Brynda Rim,  January 25, 2009 8:28 PM  Follow-up for Phone Call        pt has appt Follow-up by: [place] Shannon,  January 28, 2009 12:38 PM

## 2010-02-12 NOTE — Assessment & Plan Note (Signed)
Summary: Hfu///cns   Vital Signs:  Patient profile:   75 year old female Height:      65 inches Weight:      142 pounds BMI:     23.72 Temp:     97.4 degrees F oral Pulse rate:   120 / minute Pulse rhythm:   regular Resp:     18 per minute BP sitting:   144 / 83  (left arm) Cuff size:   regular  Vitals Entered By: Armenia Shannon (February 05, 2009 1:55 PM) CC: xf/u...Marland KitchenMarland Kitchen pt says she is very congested...Marland KitchenMarland Kitchen pt says she cough alot at night...Marland KitchenMarland KitchenMarland Kitchen Is Patient Diabetic? No Pain Assessment Patient in pain? no       Does patient need assistance? Functional Status Self care Ambulation Normal   Primary Care Provider:  Tereso Newcomer PA-C  CC:  xf/u...Marland KitchenMarland Kitchen pt says she is very congested...Marland KitchenMarland Kitchen pt says she cough alot at night.......  History of Present Illness: 75 year old female history of COPD and was just discharged from [facility] after being admitted with a COPD exacerbation.  She developed acute renal failure and elevated transaminases.  Her acute renal failure was thought to be secondary to poor oral intake.  Her ACE inhibitor was discontinued and she was hydrated with improvement in her creatinine.  Her Norvasc was also held.  She was taken off of her pravastatin secondary to elevated transaminases.  Her abdominal ultrasound was essentially negative.  Her hepatitis serologies were also negative.  She was noted to have a normocytic anemia with a hemoglobin of 10.  She returns today for followup.  During her hospitalization she had an echocardiogram that demonstrated an ejection fraction of 55-65%.  She also had carotid Dopplers that demonstrated left internal carotid artery stenosis of 40-59%.  Since discharge from the hospital, she has continued to feel weak.  Her cough and breathing improved for about 2 days.  She finished the oral prednisone.  However, over the proceeding couple of weeks she has developed increasing shortness of breath, wheezing and cough.  Her cough is productive  of copious amounts of sputum that she describes as white to yellowish.  She denies any hemoptysis.  She has chest congestion.  She denies any fevers.  She also notes increased dyspepsia.  She notes nausea but no vomiting.   Habits & Providers  Alcohol-Tobacco-Diet     Tobacco Status: current  Problems Prior to Update: 1)  Carotid Artery Stenosis  (ICD-433.10) 2)  Transaminases, Serum, Elevated  (ICD-790.4) 3)  Anemia, Normocytic  (ICD-285.9) 4)  Essential Hypertension, Benign  (ICD-401.1) 5)  Contact Dermatitis  (ICD-692.9) 6)  Tick Bite  (ICD-E906.4) 7)  Gait Disturbance  (ICD-781.2) 8)  Syncope  (ICD-780.2) 9)  Polyneuropathy Other Diseases Classified [place]  (ICD-357.4) 10)  Screening For Malignant Neoplasm, Cervix  (ICD-V76.2) 11)  Folate-deficiency Anemia  (ICD-281.2) 12)  Breast Mass, Right  (ICD-611.72) 13)  Sinusitis  (ICD-473.9) 14)  Labrynthitis  (ICD-386.30) 15)  Bronchitis, Obstructive Chronic, With Exacerbation  (ICD-491.21) 16)  Acid Reflux Disease  (ICD-530.81) 17)  Health Maintenance Exam  (ICD-V70.0) 18)  Colonic Polyps  (ICD-211.3) 19)  Tubal Ligation, Hx of  (ICD-V26.51) 20)  Breast Biopsy, Hx of  (ICD-V15.9) 21)  Appendectomy, Hx of  (ICD-V45.79) 22)  Fracture Right Pubic Ramus  () 23)  Osteoporosis Nos  (ICD-733.00) 24)  Anemia, Folate-deficiency  (ICD-281.2) 25)  Leukoplakia, 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 * Fosmax 2)  ! * Aricept  Comments:  Nurse/Medical Assistant: pt is no longer on BP meds and chol. meds any more.... pt says the hospital took her off.... Armenia Shannon (February 05, 2009 2:00 PM)  Past History:  Past Medical History: Last updated: 08/23/2007  COLONIC POLYPS  (ICD-211.3) OSTEOPOROSIS NOS (ICD-733.00) ANEMIA, FOLATE-DEFICIENCY (ICD-281.2) LEUKOPLAKIA, VOCAL CORDS (ICD-478.5) SEIZURE DISORDER (ICD-780.39) PANIC ATTACK (ICD-300.01) POST TRAUMATIC STRESS SYNDROME (ICD-309.81) SMOKER (ICD-305.1) BRONCHITIS, RECURRENT (ICD-491.9) COPD (ICD-496)   -FeV1 64%  DLCO 42% 2005 BRONCHOPNEUMONIA (ICD-485)  Social History: Smoking Status:  current  Physical Exam  General:  alert, well-developed, and well-nourished.   Head:  normocephalic and atraumatic.   Eyes:  pupils equal, pupils round, and pupils reactive to light.   Ears:  R ear normal and L ear normal.   Nose:  no external deformity.   Mouth:  pharynx pink and moist, no erythema, and no exudates.   Neck:  supple and no cervical lymphadenopathy.   Lungs:  decreased breath sounds bilaterally No obvious wheezing or rales; no rhonchi Heart:  normal rate and regular rhythm.   Neurologic:  alert & oriented X3 and cranial nerves II-XII intact.   Psych:  normally interactive and good eye contact.     Impression & Recommendations:  Problem # 1:  BRONCHITIS, RECURRENT (ICD-491.9)  just in [facility] for COPD exacerbation with increased cough, dyspnea and sputum production, I fear recurrent infection she had advancing fibrosis on her CXR in the hosp and she may just be getting worse with her disease she completed oral pred., avelox and amox. do not think she needs another round of steroids at this point will increase her advair to 500/50 and a sample was given advised her to use neb. q 4-6 hours for the next couple days and the proair as needed will place her on doxy x 7 days  will arrange earlier f/u with pulmo. she knows to go to the ED if she develops a high fever or feels worse also, get CXR to r/o developing pneumonia  also advised her to call home health PT and arrange appt  Orders: Diagnostic X-Ray/Fluoroscopy (Diagnostic X-Ray/Flu)  Problem # 2:  CAROTID ARTERY STENOSIS  (ICD-433.10) will need repeat in one year  Her updated medication list for this problem includes:    Aspirin 325 Mg Tabs (Aspirin) ..... Once daily  Problem # 3:  TRANSAMINASES, SERUM, ELEVATED (ICD-790.4)  repeat labs today ultrasound in hosp was neg and Hep panel was also negative  Orders: T-Comprehensive Metabolic Panel ([id number])  Problem # 4:  ANEMIA, NORMOCYTIC (ICD-285.9)  repeat CBC today has a h/o folate deficiency check iron, b12 and folate studies  Orders: T-CBC w/Diff ([id number]) T-Iron [phone]) T-Iron Binding Capacity (TIBC) ([address]) T-Ferritin (17510-25852) T-Folic Acid; RBC (77824-23536) T-Vitamin B12 (14431-54008) T-Reticulocyte Count, Manual (67619)  Problem # 5:  ESSENTIAL HYPERTENSION, BENIGN (ICD-401.1) meds held  BP borderline today but will hold off on restarting check labs today to recheck creat as she had elevated creat in hosp  Her updated medication list for this problem includes:    Norvasc 10 Mg Tabs (Amlodipine besylate) ..... One tablet by mouth daily for blood pressure *note change in dose**    Lisinopril 10 Mg Tabs (Lisinopril) .Marland Kitchen... 1 tab by mouth daily  Problem # 6:  ACID  REFLUX DISEASE (ICD-530.81) restart PPI  The following medications were removed from the medication list:    Omeprazole 20 Mg Cpdr (Omeprazole) .Marland Kitchen..Marland Kitchen Two times a day Her updated medication list for this problem includes:    Omeprazole 40 Mg Cpdr (Omeprazole) .Marland Kitchen... Take 1 tablet by mouth once a day for stomach acid  Complete Medication List: 1)  Prempro 0.3-1.5 Mg Tabs (Conj estrog-medroxyprogest ace) .... Take 1 tablet by mouth daily 2)  Xanax 1 Mg Tabs (alprazolam)  .... Take 1/2  tablet by mouth 4x a day and 1 at bedtime 3)  Paxil 20 Mg Tabs (Paroxetine hcl) .... Take 1 tablet by mouth daily 4)  Spiriva Handihaler 18 Mcg Caps (Tiotropium bromide monohydrate) .... Inhale once daily 5)  Pravachol 40 Mg Tabs (Pravastatin sodium) .... Take 1 daily by  mouth 6)  Advair Diskus 250-50 Mcg/dose Misc (Fluticasone-salmeterol) .... Use 1 puff two times a day 7)  Tylenol Extra Strength 500 Mg Tabs (Acetaminophen) .... As needed 8)  Eq Womans Laxative 5 Mg Tbec (Bisacodyl) .... As needed 9)  Proair Hfa 108 (90 Base) Mcg/act Aers (Albuterol sulfate) .... As needed one to two puff every 4 hours as needed 10)  Miralax Powd (Polyethylene glycol 3350) .Marland Kitchen.. 17g by mouth once daily constipation. mix powder with a liquid and drink 11)  Paediatric nurse  .... Falls risk 12)  Oxygen  .... At bedtime 13)  Walker With Seat  .... Needed for gait and balance dysfunction 14)  Centrum Silver Chew (Multiple vitamins-minerals) .Marland Kitchen.. 1 by mouth daily 15)  Fish Oil 1000 Mg Caps (Omega-3 fatty acids) .Marland Kitchen.. 1 by mouth daily 16)  Aspirin 325 Mg Tabs (Aspirin) .... Once daily 17)  Caltrate 600+d Plus 600-400 Mg-unit Tabs (Calcium carbonate-vit d-min) .... Take 1 tablet by mouth two times a day 18)  Betamethasone Dipropionate Aug 0.05 % Oint (Betamethasone dipropionate aug) .... Apply topically to affected area two times a day as needed until healed 19)  Actonel 150 Mg Tabs (Risedronate sodium) .Marland Kitchen.. 1 by mouth one time per month 20)  Norvasc 10 Mg Tabs (Amlodipine besylate) .... One tablet by mouth daily for blood pressure *note change in dose** 21)  Lisinopril 10 Mg Tabs (Lisinopril) .Marland Kitchen.. 1 tab by mouth daily 22)  Doxycycline Hyclate 100 Mg Tabs (Doxycycline hyclate) .... Take 1 tablet by mouth two times a day until all gone 23)  Omeprazole 40 Mg Cpdr (Omeprazole) .... Take 1 tablet by mouth once a day for stomach acid  Patient Instructions: 1)  Use your nebulizer every 4-6 hours for the next 2-3 days. 2)  I have given you a sample of Advair with a higher dose of steroids in it.  Start that tonight and use two times a day. 3)  We will set you up for an appointment with Dr. Delford Field. 4)  Take the doxycycline until all gone. 5)  Get your chest xray today. 6)  Use your ProAir  every 4-6 hours as needed. 7)  Wear your oxygen as much as possible. 8)  Go to the emergency room if you feel worse or develop a fever of 101 or higher. 9)  Tobacco is very bad for your health and your loved ones ! You should stop smoking !  10)  Please schedule a follow-up appointment in 1 week with Santia Labate.  Prescriptions: OMEPRAZOLE 40 MG CPDR (OMEPRAZOLE) Take 1 tablet by mouth once a day for stomach acid  #30 x 3   Entered and Authorized by:  Tereso Newcomer PA-C   Signed by:   Tereso Newcomer PA-C on 02/05/2009   Method used:   Faxed to ...       [facility]       [address]       [place], [place]  [address]       Ph: [phone]       Fax: [phone]   RxID:   [id number] DOXYCYCLINE HYCLATE 100 MG TABS (DOXYCYCLINE HYCLATE) Take 1 tablet by mouth two times a day until all gone  #14 x 0   Entered and Authorized by:   Tereso Newcomer PA-C   Signed by:   Tereso Newcomer PA-C on 02/05/2009   Method used:   Faxed to ...       [facility]       [address]       [place], [place]  [address]       Ph: [phone]       Fax: [phone]   RxID:   [id number]   Appended Document: Hfu///cns Medications Added ADVAIR DISKUS 500-50 MCG/DOSE AEPB (FLUTICASONE-SALMETEROL) Take one puff two times a day          Clinical Lists Changes  Medications: Added new medication of ADVAIR DISKUS 500-50 MCG/DOSE AEPB (FLUTICASONE-SALMETEROL) Take one puff two times a day - Signed Rx of ADVAIR DISKUS 500-50 MCG/DOSE AEPB (FLUTICASONE-SALMETEROL) Take one puff two times a day;  #1 x 0;  Signed;  Entered by: Tereso Newcomer PA-C;  Authorized by: Tereso Newcomer PA-C;  Method used: Samples Given    Prescriptions: ADVAIR DISKUS 500-50 MCG/DOSE AEPB (FLUTICASONE-SALMETEROL) Take one puff two times a day  #1 x 0   Entered and Authorized by:   Tereso Newcomer PA-C   Signed by:    Tereso Newcomer PA-C on 02/05/2009   Method used:   Samples Given   RxID:   [id number]

## 2010-02-12 NOTE — Letter (Signed)
Summary: Handout Printed  Printed Handout:  - Peripheral Arterial Disease of the Legs

## 2010-02-12 NOTE — Progress Notes (Signed)
Summary: Need cardiology referral   Phone Note Outgoing Call   Summary of Call: Ashlee [name], She needs referral to cardiology. Referral letter will be done later.  Initial call taken by: Tereso Newcomer PA-C,  April 16, 2009 12:02 PM

## 2010-02-12 NOTE — Assessment & Plan Note (Signed)
Summary: 3 month f/u /tmm   Vital Signs:  Patient profile:   75 year old female Height:      65 inches Weight:      144 pounds BMI:     24.05 Temp:     97.5 degrees F oral Pulse rate:   101 / minute Pulse rhythm:   regular Resp:     16 per minute BP sitting:   104 / 67  (left arm) Cuff size:   regular  Vitals Entered By: [place] Shannon (July 29, 2009 2:12 PM) CC: Hypertension Management Is Patient Diabetic? No Pain Assessment Patient in pain? no       Does patient need assistance? Functional Status Self care Ambulation Normal   Primary Care Provider:  Tereso Newcomer PA-C  CC:  Hypertension Management.  History of Present Illness: Here for f/u.  Has seen cardiology.  Myoview was neg for ischemia.  No further w/u.  Dyspnea likely related to COPD.  She has been counseled in the past to d/c cigs.  She denies any further near syncope or dizziness.  Right leg pain:  Has noted since she fell in doctor's office a year ago.  Was there with her daughter for an appt.  Never saw anyone.  Was supposed to go to the ED and the doctor's office called an ambulance.  She never went to the hospital.  Hurts from her ankle to her knee.  Mainly points to her tibia.  She notes pain at rest.  Wakes her up at night.  She does note some cramping.  ?if she is describing restless legs.  Moving her legs makes her feel better.  She may note some feelings of something "crawling" on her skin.    Hypertension History:      She denies chest pain, dyspnea with exertion, and syncope.        Positive major cardiovascular risk factors include female age 16 years old or older, hyperlipidemia, hypertension, and current tobacco user.     Current Medications (verified): 1)  Xanax 1 Mg Tabs (Alprazolam) .... 1/2 Tab By Mouth 4 Times Daily As Needed 2)  Paxil 20 Mg  Tabs (Paroxetine Hcl) .... Take 1 Tablet By Mouth Daily 3)  Spiriva Handihaler 18 Mcg  Caps (Tiotropium Bromide Monohydrate) .... Inhale Once  Daily 4)  Eq Womans Laxative 5 Mg  Tbec (Bisacodyl) .... As Needed 5)  Proair Hfa 108 (90 Base) Mcg/act  Aers (Albuterol Sulfate) .... As Needed One To Two Puff Every 4 Hours As Needed 6)  Miralax   Powd (Polyethylene Glycol 3350) .Marland Kitchen.. 17g By Mouth Once Daily Constipation. Mix Powder With A Liquid and Drink 7)  Shower Chair .... Falls Risk 8)  Oxygen .... 2l At Bedtime 9)  Walker With Seat .... Needed For Gait and Balance Dysfunction 10)  Centrum Silver  Chew (Multiple Vitamins-Minerals) .Marland Kitchen.. 1 By Mouth Daily 11)  Fish Oil 1000 Mg Caps (Omega-3 Fatty Acids) .Marland Kitchen.. 1 By Mouth Daily 12)  Aspirin 81 Mg Tbec (Aspirin) .... Take One Tablet By Mouth Daily 13)  Caltrate 600+d Plus 600-400 Mg-Unit Tabs (Calcium Carbonate-Vit D-Min) .... Take 1 Tablet By Mouth Two Times A Day 14)  Actonel 150 Mg Tabs (Risedronate Sodium) .Marland Kitchen.. 1 By Mouth One Time Per Month 15)  Advair Diskus 250-50 Mcg/dose Aepb (Fluticasone-Salmeterol) .Marland Kitchen.. 1 Puff Two Times A Day 16)  Lisinopril 10 Mg Tabs (Lisinopril) .... Take 1 Tablet By Mouth Once A Day 17)  Omeprazole 40 Mg Cpdr (Omeprazole) .Marland KitchenMarland KitchenMarland Kitchen  Take 1 Capsule By Mouth Once A Day For Stomach Acid 18)  Pravastatin Sodium 40 Mg Tabs (Pravastatin Sodium) .... Take 1 Tab By Mouth At Bedtime  Allergies (verified): 1)  ! * Fosmax 2)  ! * Aricept  Past History:  Past Medical History: HYPERTENSION (ICD-401.9) ASTHMA (ICD-493.90) DYSLIPIDEMIA (ICD-272.4) CAROTID ARTERY STENOSIS (ICD-433.10)   a.  dopplers during admxn 01/2009:  LICA 40-59% TRANSAMINASES, SERUM, ELEVATED (ICD-790.4) CONTACT DERMATITIS (ICD-692.9) POLYNEUROPATHY OTHER DISEASES CLASSIFIED ELSW (ICD-357.4) FOLATE-DEFICIENCY ANEMIA (ICD-281.2) BREAST MASS, RIGHT (ICD-611.72) ACID REFLUX DISEASE (ICD-530.81) COLONIC POLYPS (ICD-211.3) FRACTURE RIGHT PUBIC RAMUS OSTEOPOROSIS NOS (ICD-733.00) ANEMIA, FOLATE-DEFICIENCY (ICD-281.2) LEUKOPLAKIA, VOCAL CORDS (ICD-478.5) SEIZURE DISORDER (ICD-780.39) PANIC ATTACK  (ICD-300.01) COPD (ICD-496)   -FeV1 64%  DLCO 42% 2005 Dyspnea   a.  myoview 05/2009:  no ischemia  Physical Exam  General:  alert, well-developed, and well-nourished.   Head:  normocephalic and atraumatic.   Neck:  supple and no thyromegaly.   Lungs:  decreased breath sounds no wheezing  Heart:  normal rate and regular rhythm.   Abdomen:  soft.   Msk:  no deformities noted BLE very mild tend with pressure over tibia on left bilat knees stable without effusion or crepitus bilat ankles without pain on palp or deformity  Pulses:  DP/PT 2+ bilat  Extremities:  no edema calves soft without palpable cords  Neurologic:  alert & oriented X3 and cranial nerves II-XII intact.   Psych:  normally interactive.     Impression & Recommendations:  Problem # 1:  LEG PAIN, BILATERAL (ICD-729.5)  ? restless legs will check bmet, ferritin, cbc, magnesium today handout given for her to look at trial of Tylenol 500 mg 1-2 tabs at bedtime to see if this helps if symptoms sound more like RLS and her labs are ok; consider mirapex or requip  Orders: T-Basic Metabolic Panel [phone]) T-CBC w/Diff [phone]) T-Ferritin [phone]) T-TSH [phone])  Problem # 2:  HYPERTENSION (ICD-401.9) controlled  Her updated medication list for this problem includes:    Lisinopril 10 Mg Tabs (Lisinopril) .Marland Kitchen... Take 1 tablet by mouth once a day  Problem # 3:  DYSLIPIDEMIA (ICD-272.4) patient actually not taking amlodipine she can remain on simvastatin her pharmacy was called while she was here to correct her med list   The following medications were removed from the medication list:    Pravastatin Sodium 40 Mg Tabs (Pravastatin sodium) .Marland Kitchen... Take 1 tab by mouth at bedtime Her updated medication list for this problem includes:    Simvastatin 40 Mg Tabs (Simvastatin) .Marland Kitchen... Take 1 tab by mouth at bedtime  Problem # 4:  CAROTID ARTERY STENOSIS (ICD-433.10) needs repeat 2012  Her updated  medication list for this problem includes:    Aspirin 81 Mg Tbec (Aspirin) .Marland Kitchen... Take one tablet by mouth daily  Problem # 5:  COPD (ICD-496) likely cause of dyspnea myoview was neg for ischemia she knows to d/c cigs  Her updated medication list for this problem includes:    Spiriva Handihaler 18 Mcg Caps (Tiotropium bromide monohydrate) ..... Inhale once daily    Advair Diskus 250-50 Mcg/dose Aepb (Fluticasone-salmeterol) .Marland Kitchen... 1 puff two times a day  Complete Medication List: 1)  Xanax 1 Mg Tabs (Alprazolam) .... 1/2 tab by mouth 4 times daily as needed 2)  Paxil 20 Mg Tabs (Paroxetine hcl) .... Take 1 tablet by mouth daily 3)  Spiriva Handihaler 18 Mcg Caps (Tiotropium bromide monohydrate) .... Inhale once daily 4)  Eq Womans Laxative 5 Mg Tbec (Bisacodyl) .Marland KitchenMarland KitchenMarland Kitchen  As needed 5)  Miralax Powd (Polyethylene glycol 3350) .Marland Kitchen.. 17g by mouth once daily constipation. mix powder with a liquid and drink 6)  Paediatric nurse  .... Falls risk 7)  Oxygen  .... 2l at bedtime 8)  Walker With Seat  .... Needed for gait and balance dysfunction 9)  Centrum Silver Chew (Multiple vitamins-minerals) .Marland Kitchen.. 1 by mouth daily 10)  Fish Oil 1000 Mg Caps (Omega-3 fatty acids) .Marland Kitchen.. 1 by mouth daily 11)  Aspirin 81 Mg Tbec (Aspirin) .... Take one tablet by mouth daily 12)  Caltrate 600+d Plus 600-400 Mg-unit Tabs (Calcium carbonate-vit d-min) .... Take 1 tablet by mouth two times a day 13)  Actonel 150 Mg Tabs (Risedronate sodium) .Marland Kitchen.. 1 by mouth one time per month 14)  Advair Diskus 250-50 Mcg/dose Aepb (Fluticasone-salmeterol) .Marland Kitchen.. 1 puff two times a day 15)  Lisinopril 10 Mg Tabs (Lisinopril) .... Take 1 tablet by mouth once a day 16)  Omeprazole 40 Mg Cpdr (Omeprazole) .... Take 1 capsule by mouth once a day for stomach acid 17)  Simvastatin 40 Mg Tabs (Simvastatin) .... Take 1 tab by mouth at bedtime  Other Orders: Tdap => 45yrs IM ([id number]) Admin 1st Vaccine ([id number]) Admin 1st Vaccine [facility])  [phone]  Hypertension Assessment/Plan:      The patient's hypertensive risk group is category B: At least one risk factor (excluding diabetes) with no target organ damage.  Her calculated 10 year risk of coronary heart disease is 8 %.  Today's blood pressure is 104/67.  Her blood pressure goal is < 140/90.  Patient Instructions: 1)  Take Tylenol 500 mg 1-2 tabs at bedtime every night to see if this helps your leg pain. 2)  Read the handout I gave you. 3)  I am going to check your iron levels to see if this has anything to do with your leg pain. 4)  If your pain in your leg is no better or getting worse, arrange an earlier follow up appointment. 5)  Continue taking Simvastatin.  Do not worry about changing to Pravastatin. 6)  Schedule lab visit in 3 months for Lipids and LFTs. 7)  Schedule appointment with Lorin Picket for follow up on blood pressure in 5 months. 8)  Call in September to schedule your flu shot. 9)  Td shot today.   Tetanus/Td Vaccine    Vaccine Type: Tdap    Site: right deltoid    Mfr: San[employer]i Pasteur    Dose: 0.5 ml    Route: IM    Given by: Armenia Shannon    Exp. Date: 12/25/2011    Lot #: [id number]    VIS given: 11/29/06 version given July 29, 2009.

## 2010-02-12 NOTE — Assessment & Plan Note (Signed)
Summary: 2 MONTH ROV.SL  Medications Added XANAX 1 MG TABS (ALPRAZOLAM) 1/2 tab by mouth 4 times daily as needed ASPIRIN 81 MG TBEC (ASPIRIN) Take one tablet by mouth daily COMBIVENT 18-103 MCG/ACT AERO (IPRATROPIUM-ALBUTEROL) as directed        Primary Provider:  Tereso Newcomer PA-C   History of Present Illness: 75 year old female I saw in May of 2011 for evaluation of dyspnea and dizziness. Prior echocardiogram in December of 2010 showed normal LV function. A BNP at that time also was normal. Previous chest x-ray in January of 2011 showed COPD/emphysema. Myoview in May of 2011 showed an ejection fraction of 83% and normal perfusion. Since I saw her previously she continues to have some dyspnea on exertion relieved with rest. It is not associated with chest pain. There is no orthopnea, PND or pedal edema. She has liberalized her salt and fluid intake and her dizziness with standing has resolved.  Current Medications (verified): 1)  Xanax 1 Mg Tabs (Alprazolam) .... 1/2 Tab By Mouth 4 Times Daily As Needed 2)  Paxil 20 Mg  Tabs (Paroxetine Hcl) .... Take 1 Tablet By Mouth Daily 3)  Spiriva Handihaler 18 Mcg  Caps (Tiotropium Bromide Monohydrate) .... Inhale Once Daily 4)  Eq Womans Laxative 5 Mg  Tbec (Bisacodyl) .... As Needed 5)  Proair Hfa 108 (90 Base) Mcg/act  Aers (Albuterol Sulfate) .... As Needed One To Two Puff Every 4 Hours As Needed 6)  Miralax   Powd (Polyethylene Glycol 3350) .Marland Kitchen.. 17g By Mouth Once Daily Constipation. Mix Powder With A Liquid and Drink 7)  Shower Chair .... Falls Risk 8)  Oxygen .... 2l At Bedtime 9)  Walker With Seat .... Needed For Gait and Balance Dysfunction 10)  Centrum Silver  Chew (Multiple Vitamins-Minerals) .Marland Kitchen.. 1 By Mouth Daily 11)  Fish Oil 1000 Mg Caps (Omega-3 Fatty Acids) .Marland Kitchen.. 1 By Mouth Daily 12)  Aspirin 81 Mg Tbec (Aspirin) .... Take One Tablet By Mouth Daily 13)  Caltrate 600+d Plus 600-400 Mg-Unit Tabs (Calcium Carbonate-Vit D-Min) .... Take  1 Tablet By Mouth Two Times A Day 14)  Actonel 150 Mg Tabs (Risedronate Sodium) .Marland Kitchen.. 1 By Mouth One Time Per Month 15)  Advair Diskus 250-50 Mcg/dose Aepb (Fluticasone-Salmeterol) .Marland Kitchen.. 1 Puff Two Times A Day 16)  Lisinopril 10 Mg Tabs (Lisinopril) .... Take 1 Tablet By Mouth Once A Day 17)  Simvastatin 40 Mg Tabs (Simvastatin) .... Take 1 Tab By Mouth At Bedtime For Cholesterol 18)  Omeprazole 40 Mg Cpdr (Omeprazole) .... Take 1 Capsule By Mouth Once A Day For Stomach Acid 19)  Combivent 18-103 Mcg/act Aero (Ipratropium-Albuterol) .... As Directed  Allergies: 1)  ! * Fosmax 2)  ! * Aricept  Past History:  Past Medical History: Reviewed history from 05/19/2009 and no changes required. HYPERTENSION (ICD-401.9) ASTHMA (ICD-493.90) DYSLIPIDEMIA (ICD-272.4) CAROTID ARTERY STENOSIS (ICD-433.10) TRANSAMINASES, SERUM, ELEVATED (ICD-790.4) CONTACT DERMATITIS (ICD-692.9) POLYNEUROPATHY OTHER DISEASES CLASSIFIED ELSW (ICD-357.4) FOLATE-DEFICIENCY ANEMIA (ICD-281.2) BREAST MASS, RIGHT (ICD-611.72) ACID REFLUX DISEASE (ICD-530.81) COLONIC POLYPS (ICD-211.3) FRACTURE RIGHT PUBIC RAMUS OSTEOPOROSIS NOS (ICD-733.00) ANEMIA, FOLATE-DEFICIENCY (ICD-281.2) LEUKOPLAKIA, VOCAL CORDS (ICD-478.5) SEIZURE DISORDER (ICD-780.39) PANIC ATTACK (ICD-300.01) COPD (ICD-496)   -FeV1 64%  DLCO 42% 2005  Past Surgical History: Reviewed history from 05/19/2009 and no changes required. TUBAL LIGATION, HX OF (ICD-V26.51) BREAST BIOPSY, HX OF (ICD-V15.9) APPENDECTOMY, HX OF (ICD-V45.79) H/O c-section Cholecystectomy  Social History: Reviewed history from 05/19/2009 and no changes required. Lives with learning disabled daughter. Previous bartender. widowed Current Smoker Alcohol use-no  Review of Systems       no fevers or chills, productive cough, hemoptysis, dysphasia, odynophagia, melena, hematochezia, dysuria, hematuria, rash, seizure activity, orthopnea, PND, pedal edema, claudication. Remaining  systems are negative.   Vital Signs:  Patient profile:   75 year old female Height:      65 inches Weight:      141 pounds BMI:     23.55 Pulse rate:   99 / minute Resp:     14 per minute BP sitting:   136 / 79  (left arm)  Vitals Entered By: Kem Parkinson (July 18, 2009 10:15 AM)  Physical Exam  General:  Well-developed well-nourished in no acute distress.  Skin is warm and dry.  HEENT is normal.  Neck is supple. No thyromegaly.  Chest is clear to auscultation with normal expansion.  Cardiovascular exam is regular rate and rhythm.  Abdominal exam nontender or distended. No masses palpated. Extremities show no edema. neuro grossly intact    Impression & Recommendations:  Problem # 1:  DYSPNEA (ICD-786.05) Patient's Myoview is normal. I think her dyspnea is most likely related to her lung disease. No further cardiac workup and Her updated medication list for this problem includes:    Aspirin 81 Mg Tbec (Aspirin) .Marland Kitchen... Take one tablet by mouth daily    Lisinopril 10 Mg Tabs (Lisinopril) .Marland Kitchen... Take 1 tablet by mouth once a day  Problem # 2:  HYPERTENSION (ICD-401.9) Blood pressure controlled on present medications. Her orthostatic symptoms have resolved with increased salt and fluid intake. If this is a problem in the future then her lisinopril can be decreased or discontinued. Her updated medication list for this problem includes:    Aspirin 81 Mg Tbec (Aspirin) .Marland Kitchen... Take one tablet by mouth daily    Lisinopril 10 Mg Tabs (Lisinopril) .Marland Kitchen... Take 1 tablet by mouth once a day  Problem # 3:  DIZZINESS (ICD-780.4) Resolved with increased fluid and sodium intake.  Problem # 4:  DYSLIPIDEMIA (ICD-272.4) Continue statin. Lipids and liver monitored by primary care. Her updated medication list for this problem includes:    Simvastatin 40 Mg Tabs (Simvastatin) .Marland Kitchen... Take 1 tab by mouth at bedtime for cholesterol  Problem # 5:  CAROTID ARTERY STENOSIS (ICD-433.10) I cannot  find documentation of this although listed in the past medical history. Continue aspirin and statin. Followup per primary care. Her updated medication list for this problem includes:    Aspirin 81 Mg Tbec (Aspirin) .Marland Kitchen... Take one tablet by mouth daily  Problem # 6:  SMOKER (ICD-305.1) Patient counseled on discontinuing.  Problem # 7:  COPD (ICD-496)  Her updated medication list for this problem includes:    Spiriva Handihaler 18 Mcg Caps (Tiotropium bromide monohydrate) ..... Inhale once daily    Proair Hfa 108 (90 Base) Mcg/act Aers (Albuterol sulfate) .Marland Kitchen... As needed one to two puff every 4 hours as needed    Advair Diskus 250-50 Mcg/dose Aepb (Fluticasone-salmeterol) .Marland Kitchen... 1 puff two times a day    Combivent 18-103 Mcg/act Aero (Ipratropium-albuterol) .Marland Kitchen... As directed

## 2010-02-12 NOTE — Assessment & Plan Note (Signed)
Summary: F/u X-ray results   Vital Signs:  Patient profile:   75 year old female Weight:      146 pounds O2 Sat:      94 % on Room air Temp:     97 degrees F oral Pulse rate:   102 / minute Pulse rhythm:   regular Resp:     24 per minute BP sitting:   180 / 98  (left arm) Cuff size:   regular  Vitals Entered By: Hale Drone CMA (December 11, 2009 10:20 AM)  O2 Flow:  Room air CC: F/u on BP. Pt denies HA's, chest pains, ankle swelling, light headed. But she is having some SOB w/some cough. This has been going on for 3 days., Hypertension Management Is Patient Diabetic? No Pain Assessment Patient in pain? yes     Location: legs Intensity: 7 Type: aching Onset of pain  With activity Comments Pk Flow: 150 - 110 - 130   Serial Vital Signs/Assessments:  Time      Position  BP       Pulse  Resp  Temp     By 10:28 AM            160/80                         Hale Drone CMA  Comments: 10:28 AM Took BP on her right arm after pt. situated herself and calmed down. She states she was very upset when she came in b/c of transportation issues.  By: Hale Drone CMA    Primary Care Provider:  Tereso Newcomer PA-C  CC:  F/u on BP. Pt denies HA's, chest pains, ankle swelling, light headed. But she is having some SOB w/some cough. This has been going on for 3 days., and Hypertension Management.  History of Present Illness:  Pt into the office for f/u on leg pain X-rays done on 11/14/2009 shows: Right - slight diffuse osteopenia and slight superficial femoral artery vascular calcification along with sligth medial and lateral compartment narrowing left - Degenerative joint disease involving the medial compartment. no acute abnormality  She has been taking her usual ASA 81mg  by mouth daily and then for leg pain she takes ASA 325mg  by mouth daily.  States that tylenol has not been effective for the pain  Hypertension History:      She denies headache, chest pain, and palpitations.  elevated  today but pt reports that she has been up all night  - she was afraid to go to sleep for fear she would miss transportation.        Positive major cardiovascular risk factors include female age 45 years old or older, hyperlipidemia, hypertension, and current tobacco user.        Positive history for target organ damage include peripheral vascular disease.     Habits & Providers  Alcohol-Tobacco-Diet     Alcohol drinks/day: 0     Tobacco Status: current     Tobacco Counseling: to quit use of tobacco products     Cigarette Packs/Day: <0.25     Year Started: 1950s     Pack years: 62  Exercise-Depression-Behavior     Does Patient Exercise: yes     Type of exercise: walk     Exercise (avg: min/session): 7:30     Depression Counseling: not indicated; screening negative for depression     Drug Use: no  Current Medications (verified): 1)  Xanax 1  Mg Tabs (Alprazolam) .... 1/2 Tab By Mouth 4 Times Daily and 1 At Bedtime 2)  Paxil 20 Mg  Tabs (Paroxetine Hcl) .... Take 1 Tablet By Mouth Daily 3)  Spiriva Handihaler 18 Mcg  Caps (Tiotropium Bromide Monohydrate) .... Inhale Once Daily 4)  Eq Womans Laxative 5 Mg  Tbec (Bisacodyl) .... As Needed 5)  Miralax   Powd (Polyethylene Glycol 3350) .Marland Kitchen.. 17g By Mouth Once Daily Constipation. Mix Powder With A Liquid and Drink 6)  Paediatric nurse .... Falls Risk 7)  Oxygen .... 2l At Bedtime 8)  Walker With Seat .... Needed For Gait and Balance Dysfunction 9)  Centrum Silver  Chew (Multiple Vitamins-Minerals) .Marland Kitchen.. 1 By Mouth Daily 10)  Fish Oil 1000 Mg Caps (Omega-3 Fatty Acids) .Marland Kitchen.. 1 By Mouth Daily 11)  Aspirin 81 Mg Tbec (Aspirin) .... Take One Tablet By Mouth Daily 12)  Caltrate 600+d Plus 600-400 Mg-Unit Tabs (Calcium Carbonate-Vit D-Min) .... Take 1 Tablet By Mouth Two Times A Day 13)  Actonel 150 Mg Tabs (Risedronate Sodium) .Marland Kitchen.. 1 By Mouth One Time Per Month 14)  Advair Diskus 250-50 Mcg/dose Aepb (Fluticasone-Salmeterol) .Marland Kitchen.. 1 Puff Two Times A  Day 15)  Omeprazole 40 Mg Cpdr (Omeprazole) .... Take 1 Capsule By Mouth Once A Day For Stomach Acid 16)  Pravastatin Sodium 80 Mg Tabs (Pravastatin Sodium) .Marland Kitchen.. 1 Tab By Mouth Daily 17)  Proair Hfa 108 (90 Base) Mcg/act Aers (Albuterol Sulfate) .Marland Kitchen.. 1-2 Puffs Every 4 Hours As Needed 18)  Combivent 18-103 Mcg/act Aero (Ipratropium-Albuterol) .... As Needed 19)  Singulair 10 Mg Tabs (Montelukast Sodium) .... Take 1 Tablet By Mouth Once A Day  Allergies (verified): 1)  ! * Fosmax 2)  ! * Aricept 3)  ! Lisinopril  Review of Systems General:  Denies fever. CV:  Denies chest pain or discomfort. Resp:  Denies cough. GI:  Denies abdominal pain, nausea, and vomiting. MS:  Complains of muscle weakness; bil leg pain, worse at night R>L.  Physical Exam  General:  alert.   Lungs:  expiratory wheezes throughout increased AP diameter Heart:  normal rate and regular rhythm.   Msk:  up to the exam table - slow   Impression & Recommendations:  Problem # 1:  LEG PAIN, BILATERAL (ICD-729.5) reviewed x-ray results with pt arthritis - would not want to start NSAID due to previous hx of renal insuff and pt is taking asa although she has been told to decrease amount she is taking, ? if she will will given tramadol to take as needed - aware that pt is taking SSRI so will need to monitor  Problem # 2:  PVD (ICD-443.9) slight superficial femoral artery vascular calcification noted on x-ray pt needs vascular studies - pt has declined at this time - wants to wait untiil the summer  Problem # 3:  SMOKER (ICD-305.1) advised pt that she needs to quit especially with dx of PVD  Complete Medication List: 1)  Xanax 1 Mg Tabs (Alprazolam) .... 1/2 tab by mouth 4 times daily and 1 at bedtime 2)  Paxil 20 Mg Tabs (Paroxetine hcl) .... Take 1 tablet by mouth daily 3)  Spiriva Handihaler 18 Mcg Caps (Tiotropium bromide monohydrate) .... Inhale once daily 4)  Eq Womans Laxative 5 Mg Tbec (Bisacodyl) .... As  needed 5)  Miralax Powd (Polyethylene glycol 3350) .Marland Kitchen.. 17g by mouth once daily constipation. mix powder with a liquid and drink 6)  Paediatric nurse  .... Falls risk 7)  Oxygen  .Marland KitchenMarland KitchenMarland Kitchen  2l at bedtime 8)  Walker With Seat  .... Needed for gait and balance dysfunction 9)  Centrum Silver Chew (Multiple vitamins-minerals) .Marland Kitchen.. 1 by mouth daily 10)  Fish Oil 1000 Mg Caps (Omega-3 fatty acids) .Marland Kitchen.. 1 by mouth daily 11)  Aspirin 81 Mg Tbec (Aspirin) .... Take one tablet by mouth daily 12)  Caltrate 600+d Plus 600-400 Mg-unit Tabs (Calcium carbonate-vit d-min) .... Take 1 tablet by mouth two times a day 13)  Actonel 150 Mg Tabs (Risedronate sodium) .Marland Kitchen.. 1 by mouth one time per month 14)  Advair Diskus 250-50 Mcg/dose Aepb (Fluticasone-salmeterol) .Marland Kitchen.. 1 puff two times a day 15)  Omeprazole 40 Mg Cpdr (Omeprazole) .... Take 1 capsule by mouth once a day for stomach acid 16)  Pravastatin Sodium 80 Mg Tabs (Pravastatin sodium) .Marland Kitchen.. 1 tab by mouth daily 17)  Proair Hfa 108 (90 Base) Mcg/act Aers (Albuterol sulfate) .Marland Kitchen.. 1-2 puffs every 4 hours as needed 18)  Combivent 18-103 Mcg/act Aero (Ipratropium-albuterol) .... As needed 19)  Singulair 10 Mg Tabs (Montelukast sodium) .... Take 1 tablet by mouth once a day 20)  Tramadol Hcl 50 Mg Tabs (Tramadol hcl) .... One tablet by mouth daily as needed for extreme pain in legs  Other Orders: Peak Flow Rate (94150) Pulse Oximetry (single measurment) (04540)  Hypertension Assessment/Plan:      The patient's hypertensive risk group is category C: Target organ damage and/or diabetes.  Her calculated 10 year risk of coronary heart disease is 20 %.  Today's blood pressure is 180/98.  Her blood pressure goal is < 140/90.  Patient Instructions: 1)  You have peripheral artery disease and arthritis in your legs. 2)  You will need to be referred to the vascular specialist but you have requested to wait until the summer for this. 3)  You can take tramadol 50mg  by mouth daily  as needed for EXTREME pain. 4)  Follow up as  5)  will need peak flow, 02 sat. will refer to vascular specialist when you want to go to this appointment Prescriptions: TRAMADOL HCL 50 MG TABS (TRAMADOL HCL) One tablet by mouth daily as needed for extreme pain in legs  #20 x 0   Entered and Authorized by:   Lehman Prom FNP   Signed by:   Lehman Prom FNP on 12/11/2009   Method used:   Electronically to        [facility]  [address] [phone]* (retail)       [address]       [place], [place]  [id number]       Ph: [phone] or [phone]       Fax: [phone]   RxID:   [id number]    Orders Added: 1)  Peak Flow Rate [94150] 2)  Pulse Oximetry (single measurment) [94760] 3)  Est. Patient Level III [38756]

## 2010-02-12 NOTE — Progress Notes (Signed)
Summary: Needs Refills  Phone Note Call from Patient Message from:  Patient  Caller: Patient Reason for Call: Refill Medication Summary of Call: PT NEEDS A REFILLS ON HER MEDS Initial call taken by: Oscar La,  October 10, 2009 3:32 PM  Follow-up for Phone Call        Left message with family member for pt. to return call.  Dutch Quint RN  October 10, 2009 4:00 PM  Pt. wants her miralax refilled, but it was prescribed by her "lung doctor".  Do you want to write a new Rx for her? Follow-up by: Dutch Quint RN,  October 10, 2009 4:34 PM  Additional Follow-up for Phone Call Additional follow up Details #1::        Rx in basket to fax to her pharmacy Additional Follow-up by: Tereso Newcomer PA-C,  October 13, 2009 1:42 PM    Additional Follow-up for Phone Call Additional follow up Details #2::    [facility] [address] per pt. request.  Dutch Quint RN  October 13, 2009 5:02 PM   Prescriptions: MIRALAX   POWD (POLYETHYLENE GLYCOL 3350) 17g by mouth once daily constipation. Mix powder with a liquid and drink  #1 bottle x 3   Entered and Authorized by:   Tereso Newcomer PA-C   Signed by:   Tereso Newcomer PA-C on 10/13/2009   Method used:   Printed then [facility]ed to ...       [facility]       [address]       [place], [place]  [address]       Ph: [phone]       Fax: [phone]   RxID:   [id number]

## 2010-02-12 NOTE — Assessment & Plan Note (Signed)
Summary: Pulmonary OV   Visit Type:  Follow-up Primary Provider/Referring Provider:  Tereso Newcomer PA-C  CC:  COPD.  The patient says her breathing is slightly better. She is still smoking 2-3 cigarettes daily.Ashlee [name]  History of Present Illness: Pulmonary:   75 yo WF with COPD and primary emphysema--current smoker  April 22, 2009 2:23 PM Recently more edema in feet,  now better with BP med change,  now on ace and off norvasc feels weak in legs.  no change in dyspnea.  quit smoking for two weeks,  then stressed over dog dying and now smoking 2-3 cigs/day.   Has a dry cough.  Notes dyspnea is at baseline.  July 23, 2009 11:51 AM This pt saw Cardiology  MD and was told her heart was "ok".   Not much cough.  Dsypnea is the same.  No hosp visits since last ov.   The pt still smokes 2-3 cigs/d.  There is no excess mucus or chest pain.   Pt denies any significant sore throat, nasal congestion or excess secretions, fever, chills, sweats, unintended weight loss, pleurtic or exertional chest pain, orthopnea PND, or leg swelling Pt denies any increase in rescue therapy over baseline, denies waking up needing it or having any early am or nocturnal exacerbations of coughing/wheezing/or dyspnea.   Preventive Screening-Counseling & Management  Alcohol-Tobacco     Smoking Status: current     Smoking Cessation Counseling: YES     Smoke Cessation Stage: contemplative     Packs/Day: <0.25     Year Started: 1950s     Pack years: 50  Current Medications (verified): 1)  Xanax 1 Mg Tabs (Alprazolam) .... 1/2 Tab By Mouth 4 Times Daily As Needed 2)  Paxil 20 Mg  Tabs (Paroxetine Hcl) .... Take 1 Tablet By Mouth Daily 3)  Spiriva Handihaler 18 Mcg  Caps (Tiotropium Bromide Monohydrate) .... Inhale Once Daily 4)  Eq Womans Laxative 5 Mg  Tbec (Bisacodyl) .... As Needed 5)  Proair Hfa 108 (90 Base) Mcg/act  Aers (Albuterol Sulfate) .... As Needed One To Two Puff Every 4 Hours As Needed 6)  Miralax   Powd  (Polyethylene Glycol 3350) .Ashlee [name].. 17g By Mouth Once Daily Constipation. Mix Powder With A Liquid and Drink 7)  Shower Chair .... Falls Risk 8)  Oxygen .... 2l At Bedtime 9)  Walker With Seat .... Needed For Gait and Balance Dysfunction 10)  Centrum Silver  Chew (Multiple Vitamins-Minerals) .Ashlee [name].. 1 By Mouth Daily 11)  Fish Oil 1000 Mg Caps (Omega-3 Fatty Acids) .Ashlee [name].. 1 By Mouth Daily 12)  Aspirin 81 Mg Tbec (Aspirin) .... Take One Tablet By Mouth Daily 13)  Caltrate 600+d Plus 600-400 Mg-Unit Tabs (Calcium Carbonate-Vit D-Min) .... Take 1 Tablet By Mouth Two Times A Day 14)  Actonel 150 Mg Tabs (Risedronate Sodium) .Ashlee [name].. 1 By Mouth One Time Per Month 15)  Advair Diskus 250-50 Mcg/dose Aepb (Fluticasone-Salmeterol) .Ashlee [name].. 1 Puff Two Times A Day 16)  Lisinopril 10 Mg Tabs (Lisinopril) .... Take 1 Tablet By Mouth Once A Day 17)  Simvastatin 40 Mg Tabs (Simvastatin) .... Take 1 Tab By Mouth At Bedtime For Cholesterol 18)  Omeprazole 40 Mg Cpdr (Omeprazole) .... Take 1 Capsule By Mouth Once A Day For Stomach Acid  Allergies (verified): 1)  ! * Fosmax 2)  ! * Aricept  Past History:  Past medical, surgical, family and social histories (including risk factors) reviewed, and no changes noted (except as noted below).  Past Medical History: Reviewed  history from 05/19/2009 and no changes required. HYPERTENSION (ICD-401.9) ASTHMA (ICD-493.90) DYSLIPIDEMIA (ICD-272.4) CAROTID ARTERY STENOSIS (ICD-433.10) TRANSAMINASES, SERUM, ELEVATED (ICD-790.4) CONTACT DERMATITIS (ICD-692.9) POLYNEUROPATHY OTHER DISEASES CLASSIFIED ELSW (ICD-357.4) FOLATE-DEFICIENCY ANEMIA (ICD-281.2) BREAST MASS, RIGHT (ICD-611.72) ACID REFLUX DISEASE (ICD-530.81) COLONIC POLYPS (ICD-211.3) FRACTURE RIGHT PUBIC RAMUS OSTEOPOROSIS NOS (ICD-733.00) ANEMIA, FOLATE-DEFICIENCY (ICD-281.2) LEUKOPLAKIA, VOCAL CORDS (ICD-478.5) SEIZURE DISORDER (ICD-780.39) PANIC ATTACK (ICD-300.01) COPD (ICD-496)   -FeV1 64%  DLCO 42% 2005  Past  Surgical History: Reviewed history from 05/19/2009 and no changes required. TUBAL LIGATION, HX OF (ICD-V26.51) BREAST BIOPSY, HX OF (ICD-V15.9) APPENDECTOMY, HX OF (ICD-V45.79) H/O c-section Cholecystectomy  Family History: Reviewed history from 05/19/2009 and no changes required. Mother died hemhorrage Father unknown Has 96 1/2-siblings daughters with thyroid problems. Son with MI in his 24's  Social History: Reviewed history from 05/19/2009 and no changes required. Lives with learning disabled daughter. Previous bartender. widowed Current Smoker Alcohol use-no  Review of Systems       The patient complains of shortness of breath with activity.  The patient denies shortness of breath at rest, productive cough, non-productive cough, coughing up blood, chest pain, irregular heartbeats, acid heartburn, indigestion, loss of appetite, weight change, abdominal pain, difficulty swallowing, sore throat, tooth/dental problems, headaches, nasal congestion/difficulty breathing through nose, sneezing, itching, ear ache, anxiety, depression, hand/feet swelling, joint stiffness or pain, rash, change in color of mucus, and fever.    Vital Signs:  Patient profile:   75 year old female Height:      65 inches (165.10 cm) Weight:      145 pounds (65.91 kg) BMI:     24.22 O2 Sat:      95 % on Room air Temp:     98.1 degrees F (36.72 degrees C) oral Pulse rate:   92 / minute BP sitting:   134 / 64  (right arm) Cuff size:   regular  Vitals Entered By: Michel Bickers CMA (July 23, 2009 11:46 AM)  O2 Sat at Rest %:  95 O2 Flow:  Room air CC: COPD.  The patient says her breathing is slightly better. She is still smoking 2-3 cigarettes daily. Comments Medications reviewed. Daytime phone verified. Michel Bickers CMA  July 23, 2009 11:46 AM   Physical Exam  Additional Exam:  Gen: Pleasant, well nourished, in no distress ENT: no lesions, no postnasal drip Neck: No JVD, no TMG, no carotid  bruits Lungs: no use of accessory muscles,distant BS,  poor airflow, no rhonchi. Cardiovascular: RRR, heart sounds normal, no murmurs or gallops, no peripheral edema Musculoskeletal: No deformities, no cyanosis or clubbing     Impression & Recommendations:  Problem # 1:  COPD (ICD-496) Assessment Unchanged COPD Golds Stage III   with ongoing tobacco abuse plan smoking cessation classes offered smoking cessation counselling given no change in medicaitons  Complete Medication List: 1)  Xanax 1 Mg Tabs (Alprazolam) .... 1/2 tab by mouth 4 times daily as needed 2)  Paxil 20 Mg Tabs (Paroxetine hcl) .... Take 1 tablet by mouth daily 3)  Spiriva Handihaler 18 Mcg Caps (Tiotropium bromide monohydrate) .... Inhale once daily 4)  Eq Womans Laxative 5 Mg Tbec (Bisacodyl) .... As needed 5)  Proair Hfa 108 (90 Base) Mcg/act Aers (Albuterol sulfate) .... As needed one to two puff every 4 hours as needed 6)  Miralax Powd (Polyethylene glycol 3350) .Ashlee [name].. 17g by mouth once daily constipation. mix powder with a liquid and drink 7)  Paediatric nurse  .... Falls risk 8)  Oxygen  .Ashlee KitchenMarland KitchenMarland [name]  2l at bedtime 9)  Walker With Seat  .... Needed for gait and balance dysfunction 10)  Centrum Silver Chew (Multiple vitamins-minerals) .Ashlee [name].. 1 by mouth daily 11)  Fish Oil 1000 Mg Caps (Omega-3 fatty acids) .Ashlee [name].. 1 by mouth daily 12)  Aspirin 81 Mg Tbec (Aspirin) .... Take one tablet by mouth daily 13)  Caltrate 600+d Plus 600-400 Mg-unit Tabs (Calcium carbonate-vit d-min) .... Take 1 tablet by mouth two times a day 14)  Actonel 150 Mg Tabs (Risedronate sodium) .Ashlee [name].. 1 by mouth one time per month 15)  Advair Diskus 250-50 Mcg/dose Aepb (Fluticasone-salmeterol) .Ashlee [name].. 1 puff two times a day 16)  Lisinopril 10 Mg Tabs (Lisinopril) .... Take 1 tablet by mouth once a day 17)  Simvastatin 40 Mg Tabs (Simvastatin) .... Take 1 tab by mouth at bedtime for cholesterol 18)  Omeprazole 40 Mg Cpdr (Omeprazole) .... Take 1 capsule by mouth  once a day for stomach acid  Other Orders: Est. Patient Level III (60454)  Patient Instructions: 1)  Focus on smoking cessation 2)  No change in medications 3)  Return in    4      months Prescriptions: SPIRIVA HANDIHALER 18 MCG  CAPS (TIOTROPIUM BROMIDE MONOHYDRATE) inhale once daily  #30 x 6   Entered and Authorized by:   Storm Frisk MD   Signed by:   Storm Frisk MD on 07/23/2009   Method used:   [facility]  [address] [phone]* (retail)       [address]       [place], [place]  [id number]       Ph: [phone] or [phone]       Fax: [phone]   RxID:   [id number]

## 2010-02-12 NOTE — Letter (Signed)
Summary: CMN for Oxygen/[facility]  CMN for Oxygen/[facility]   Imported By: Sherian Rein 09/10/2009 13:31:20  _____________________________________________________________________  External Attachment:    Type:   Image     Comment:   External Document

## 2010-02-12 NOTE — Miscellaneous (Signed)
Summary: summary of [facility] admxn 01/07/09-01/11/09  Patient admitted 01/07/09-01/11/09 to [facility]. Had COPD exac., ARF and elevated LFTs. ARF thought to be due to poor oral intake.  Creat. improved with hydration and holding ACE. Statin held for elevated LFTs.  Abd. u/s negative.  Hepatitis panel pending. She was also noted to have n/c anemia with hgb 10.  Needs f/u on BP, anemia, LFTs at f/u   Clinical Lists Changes  Problems: Added new problem of ANEMIA, NORMOCYTIC (ICD-285.9) Added new problem of TRANSAMINASES, SERUM, ELEVATED (ICD-790.4) Added new problem of CAROTID ARTERY STENOSIS (ICD-433.10) Assessed COPD as comment only - s/p admxn 01/07/09-01/11/09 for exacerbation  Her updated medication list for this problem includes:    Spiriva Handihaler 18 Mcg Caps (Tiotropium bromide monohydrate) ..... Inhale once daily    Advair Diskus 250-50 Mcg/dose Misc (Fluticasone-salmeterol) ..... Use 1 puff two times a day    Proair Hfa 108 (90 Base) Mcg/act Aers (Albuterol sulfate) .Marland Kitchen... As needed one to two puff every 4 hours as needed  Assessed ESSENTIAL HYPERTENSION, BENIGN as comment only - ACE held during admxn 01/11/09 patient developed ARF in setting of COPD exac and near syncope thought to be due to poor oral intake creat improved with holding ACE and hydration of note, Echo with EF 55-65%  Her updated medication list for this problem includes:    Norvasc 10 Mg Tabs (Amlodipine besylate) ..... One tablet by mouth daily for blood pressure *note change in dose**    Lisinopril 10 Mg Tabs (Lisinopril) .Marland Kitchen... 1 tab by mouth daily  Assessed ANEMIA, NORMOCYTIC as comment only - noted during admxn 01/11/09 needs f/u as outpt   Assessed TRANSAMINASES, SERUM, ELEVATED as comment only - statin held during admxn 01/11/09 abdominal u/s neg. hepatitis panel pending  Assessed CAROTID ARTERY STENOSIS as comment only - dopplers during 09/11/45 admxn: LICA 40-59%   Her updated medication list for this problem  includes:    Aspirin 325 Mg Tabs (Aspirin) ..... Once daily        Allergies: 1)  ! * Fosmax 2)  ! * Aricept    Impression & Recommendations:  Problem # 1:  COPD (ICD-496) Assessment Comment Only s/p admxn 01/07/09-01/11/09 for exacerbation  Her updated medication list for this problem includes:    Spiriva Handihaler 18 Mcg Caps (Tiotropium bromide monohydrate) ..... Inhale once daily    Advair Diskus 250-50 Mcg/dose Misc (Fluticasone-salmeterol) ..... Use 1 puff two times a day    Proair Hfa 108 (90 Base) Mcg/act Aers (Albuterol sulfate) .Marland Kitchen... As needed one to two puff every 4 hours as needed  Problem # 2:  ESSENTIAL HYPERTENSION, BENIGN (ICD-401.1) ACE held during admxn 01/11/09 patient developed ARF in setting of COPD exac and near syncope thought to be due to poor oral intake creat improved with holding ACE and hydration of note, Echo with EF 55-65%  Her updated medication list for this problem includes:    Norvasc 10 Mg Tabs (Amlodipine besylate) ..... One tablet by mouth daily for blood pressure *note change in dose**    Lisinopril 10 Mg Tabs (Lisinopril) .Marland Kitchen... 1 tab by mouth daily  Problem # 3:  ANEMIA, NORMOCYTIC (ICD-285.9) Assessment: Comment Only noted during admxn 01/11/09 needs f/u as outpt  Problem # 4:  TRANSAMINASES, SERUM, ELEVATED (ICD-790.4) Assessment: Comment Only statin held during admxn 01/11/09 abdominal u/s neg. hepatitis panel pending  Problem # 5:  CAROTID ARTERY STENOSIS (ICD-433.10) dopplers during 08/12/93 admxn: LICA 40-59%   Her updated medication list for this  problem includes:    Aspirin 325 Mg Tabs (Aspirin) ..... Once daily  Complete Medication List: 1)  Prempro 0.3-1.5 Mg Tabs (Conj estrog-medroxyprogest ace) .... Take 1 tablet by mouth daily 2)  Xanax 1 Mg Tabs (alprazolam)  .... Take 1/2  tablet by mouth 4x a day and 1 at bedtime 3)  Paxil 20 Mg Tabs (Paroxetine hcl) .... Take 1 tablet by mouth daily 4)  Spiriva Handihaler 18 Mcg  Caps (Tiotropium bromide monohydrate) .... Inhale once daily 5)  Pravachol 40 Mg Tabs (Pravastatin sodium) .... Take 1 daily by mouth 6)  Advair Diskus 250-50 Mcg/dose Misc (Fluticasone-salmeterol) .... Use 1 puff two times a day 7)  Tylenol Extra Strength 500 Mg Tabs (Acetaminophen) .... As needed 8)  Eq Womans Laxative 5 Mg Tbec (Bisacodyl) .... As needed 9)  Proair Hfa 108 (90 Base) Mcg/act Aers (Albuterol sulfate) .... As needed one to two puff every 4 hours as needed 10)  Miralax Powd (Polyethylene glycol 3350) .Marland Kitchen.. 17g by mouth once daily constipation. mix powder with a liquid and drink 11)  Omeprazole 20 Mg Cpdr (Omeprazole) .... Two times a day 12)  Paediatric nurse  .... Falls risk 13)  Oxygen  .... At bedtime 14)  Walker With Seat  .... Needed for gait and balance dysfunction 15)  Centrum Silver Chew (Multiple vitamins-minerals) .Marland Kitchen.. 1 by mouth daily 16)  Fish Oil 1000 Mg Caps (Omega-3 fatty acids) .Marland Kitchen.. 1 by mouth daily 17)  Aspirin 325 Mg Tabs (Aspirin) .... Once daily 18)  Caltrate 600+d Plus 600-400 Mg-unit Tabs (Calcium carbonate-vit d-min) .... Take 1 tablet by mouth two times a day 19)  Betamethasone Dipropionate Aug 0.05 % Oint (Betamethasone dipropionate aug) .... Apply topically to affected area two times a day as needed until healed 20)  Actonel 150 Mg Tabs (Risedronate sodium) .Marland Kitchen.. 1 by mouth one time per month 21)  Norvasc 10 Mg Tabs (Amlodipine besylate) .... One tablet by mouth daily for blood pressure *note change in dose** 22)  Lisinopril 10 Mg Tabs (Lisinopril) .Marland Kitchen.. 1 tab by mouth daily

## 2010-02-12 NOTE — Letter (Signed)
Summary: [facility]  [facility]   Imported By: Arta Bruce 07/08/2009 14:54:10  _____________________________________________________________________  External Attachment:    Type:   Image     Comment:   External Document

## 2010-02-12 NOTE — Progress Notes (Signed)
   Phone Note Call from Patient Call back at [facility] Phone [phone]   Summary of Call: The pt needs more refills from prempro and she states that the provider increase the dose from advair and she needs refills from that one too. Huntley Dec ([facility] [address])  Initial call taken by: Manon Hilding,  February 11, 2009 3:02 PM  Follow-up for Phone Call        also needs refill on actonel//[facility] [address] Follow-up by: Arta Bruce,  February 12, 2009 10:51 AM  Additional Follow-up for Phone Call Additional follow up Details #1::        pt has refill on advair at pharmacy.... forward to provider... Prempro last filled on 11-18-08 for #30 x 1 actonel #3 x3 on 10-14-08 Additional Follow-up by: [place] Shannon,  February 14, 2009 3:09 PM    Prescriptions: ACTONEL 150 MG TABS (RISEDRONATE SODIUM) 1 by mouth one time per month  #3 x 5   Entered and Authorized by:   Tereso Newcomer PA-C   Signed by:   Tereso Newcomer PA-C on 02/14/2009   Method used:   [facility]  [address] [phone]* (retail)       [address]       [place], [place]  [id number]       Ph: [phone] or [phone]       Fax: [phone]   RxID:   [id number] PREMPRO 0.3-1.5 MG  TABS (CONJ ESTROG-MEDROXYPROGEST ACE) take 1 tablet by mouth daily  #30 x 2   Entered and Authorized by:   Tereso Newcomer PA-C   Signed by:   Tereso Newcomer PA-C on 02/14/2009   Method used:   [facility]  [address] [phone]* (retail)       [address]       [place], [place]  [id number]       Ph: [phone] or [phone]       Fax: [phone]   RxID:   [id number]

## 2010-02-12 NOTE — Letter (Signed)
Summary: CMN/[facility]  [facility]   Imported By: Lester Armona 08/21/2009 08:54:57  _____________________________________________________________________  External Attachment:    Type:   Image     Comment:   External Document

## 2010-02-12 NOTE — Progress Notes (Signed)
Summary: RETURNING YOUR CALL  Phone Note Call from Patient Call back at [phone]   Summary of Call: PLEASE CALL ME BACK I WAS SLEEPY AND I DON'T REMEMBER ANYTHING THAT YOU SAID. YESTERDAY PLEASE Initial call taken by: Domenic Polite,  November 05, 2009 3:35 PM  Follow-up for Phone Call        spoke with pt and informed her about the cholestrol med that we sent to pharmacy... pt says she was sleep when i called the first time Follow-up by: Armenia Shannon,  November 06, 2009 3:22 PM

## 2010-02-12 NOTE — Letter (Signed)
Summary: DRUG INTERACTION MONOGRAPH  DRUG INTERACTION MONOGRAPH   Imported By: Arta Bruce 07/30/2009 11:24:33  _____________________________________________________________________  External Attachment:    Type:   Image     Comment:   External Document

## 2010-02-12 NOTE — Assessment & Plan Note (Signed)
Summary: Cardiology Nuclear Study  Nuclear Med Background Indications for Stress Test: Evaluation for Ischemia   History: Asthma, COPD, Echo, Emphysema  History Comments: 01/08/09 Echo: EF=55-66%   Symptoms: Dizziness, DOE, SOB    Nuclear Pre-Procedure Cardiac Risk Factors: Carotid Disease, History of Smoking, Hypertension, Lipids, Smoker Caffeine/Decaff Intake: None NPO After: 11:00 AM Lungs: Diminished throughout IV 0.9% NS with Angio Cath: 22g     IV Site: (L) Forearm IV Started by: Stanton Kidney EMT-P Chest Size (in) 36     Cup Size C     Height (in): 65 Weight (lb): 140 BMI: 23.38  Nuclear Med Study 1 or 2 day study:  1 day     Stress Test Type:  Dobutamine Reading MD:  Willa Rough, MD     Resting Radionuclide:  Technetium 73m Tetrofosmin     Resting Radionuclide Dose:  10.6 mCi  Stress Radionuclide:  Technetium 78m Tetrofosmin     Stress Radionuclide Dose:  33 mCi   Stress Protocol Exercise Time (min):  7:30 min     Max HR:  136 bpm     Predicted Max HR:  149 bpm  Max Systolic BP: 141 mm Hg     Percent Max HR:  91.28 %Rate Pressure Product:  04540   Dose of Dobutamine:  30 mcg/kg/min (at max HR)  Stress Test Technologist:  Milana Na EMT-P     Nuclear Technologist:  Domenic Polite CNMT  Rest Procedure  Myocardial perfusion imaging was performed at rest 45 minutes following the intravenous administration of Myoview Technetium 82m Tetrofosmin.  Stress Procedure  The patient received IV dobutamine and no IV atropine. There were no significant changes with infusion. Myoview was injected at peak heart rate and quantitative spect images were obtained after a 45 minute delay.  QPS Raw Data Images:  Patient motion noted; appropriate software correction applied. Stress Images:  There is normal uptake in all areas. Rest Images:  Normal homogeneous uptake in all areas of the myocardium. Subtraction (SDS):  No evidence of ischemia. Transient Ischemic Dilatation:  .96   (Normal <1.22)  Lung/Heart Ratio:  .36  (Normal <0.45)  Quantitative Gated Spect Images QGS EDV:  43 ml QGS ESV:  7 ml QGS EF:  83 % QGS cine images:  Normal motion  Findings Normal nuclear study      Overall Impression  Exercise Capacity: Dobutamine stress BP Response: Normal blood pressure response. Clinical Symptoms: None ECG Impression: No significant ST segment change suggestive of ischemia. Overall Impression: Normal stress nuclear study.  Appended Document: Cardiology Nuclear Study ok  Appended Document: Cardiology Nuclear Study pt aware of results    Appended Document: Cardiology Nuclear Study The referring physcian for this patient is Olga Millers. This information was excluded on the Nuclear Med Study sheet.

## 2010-02-12 NOTE — Miscellaneous (Signed)
Summary: Stop Simvastatin  Medications Added PRAVASTATIN SODIUM 40 MG TABS (PRAVASTATIN SODIUM) Take 1 tab by mouth at bedtime      Notify patient that she needs to stop simvastatin. There is a potential interaction with simvastatin and amlodipine. Will change her to Pravastatin 40 mg at bedtime.  Please call in to her pharmacy: #30, 5 refills. Check FLP and LFTs in 3 mos. Tereso Newcomer PA-C  July 28, 2009 2:24 PM   pt aware and will call pharmacy.... Armenia Shannon  July 28, 2009 3:00 PM   Clinical Lists Changes  Problems: Assessed DYSLIPIDEMIA as comment only - poss interaction with simva and amlodipine change to pravastatin check FLP and LFTs in 3 mos  The following medications were removed from the medication list:    Simvastatin 40 Mg Tabs (Simvastatin) .Marland Kitchen... Take 1 tab by mouth at bedtime for cholesterol Her updated medication list for this problem includes:    Pravastatin Sodium 40 Mg Tabs (Pravastatin sodium) .Marland Kitchen... Take 1 tab by mouth at bedtime  - Signed Medications: Removed medication of SIMVASTATIN 40 MG TABS (SIMVASTATIN) Take 1 tab by mouth at bedtime for cholesterol - Signed Added new medication of PRAVASTATIN SODIUM 40 MG TABS (PRAVASTATIN SODIUM) Take 1 tab by mouth at bedtime - Signed       Impression & Recommendations:  Problem # 1:  DYSLIPIDEMIA (ICD-272.4) poss interaction with simva and amlodipine change to pravastatin check FLP and LFTs in 3 mos  The following medications were removed from the medication list:    Simvastatin 40 Mg Tabs (Simvastatin) .Marland Kitchen... Take 1 tab by mouth at bedtime for cholesterol Her updated medication list for this problem includes:    Pravastatin Sodium 40 Mg Tabs (Pravastatin sodium) .Marland Kitchen... Take 1 tab by mouth at bedtime  Complete Medication List: 1)  Xanax 1 Mg Tabs (Alprazolam) .... 1/2 tab by mouth 4 times daily as needed 2)  Paxil 20 Mg Tabs (Paroxetine hcl) .... Take 1 tablet by mouth daily 3)  Spiriva Handihaler 18  Mcg Caps (Tiotropium bromide monohydrate) .... Inhale once daily 4)  Eq Womans Laxative 5 Mg Tbec (Bisacodyl) .... As needed 5)  Proair Hfa 108 (90 Base) Mcg/act Aers (Albuterol sulfate) .... As needed one to two puff every 4 hours as needed 6)  Miralax Powd (Polyethylene glycol 3350) .Marland Kitchen.. 17g by mouth once daily constipation. mix powder with a liquid and drink 7)  Paediatric nurse  .... Falls risk 8)  Oxygen  .... 2l at bedtime 9)  Walker With Seat  .... Needed for gait and balance dysfunction 10)  Centrum Silver Chew (Multiple vitamins-minerals) .Marland Kitchen.. 1 by mouth daily 11)  Fish Oil 1000 Mg Caps (Omega-3 fatty acids) .Marland Kitchen.. 1 by mouth daily 12)  Aspirin 81 Mg Tbec (Aspirin) .... Take one tablet by mouth daily 13)  Caltrate 600+d Plus 600-400 Mg-unit Tabs (Calcium carbonate-vit d-min) .... Take 1 tablet by mouth two times a day 14)  Actonel 150 Mg Tabs (Risedronate sodium) .Marland Kitchen.. 1 by mouth one time per month 15)  Advair Diskus 250-50 Mcg/dose Aepb (Fluticasone-salmeterol) .Marland Kitchen.. 1 puff two times a day 16)  Lisinopril 10 Mg Tabs (Lisinopril) .... Take 1 tablet by mouth once a day 17)  Omeprazole 40 Mg Cpdr (Omeprazole) .... Take 1 capsule by mouth once a day for stomach acid 18)  Pravastatin Sodium 40 Mg Tabs (Pravastatin sodium) .... Take 1 tab by mouth at bedtime

## 2010-02-12 NOTE — Letter (Signed)
Summary: CARE MANAMENT DEPT.  CARE MANAMENT DEPT.   Imported By: Arta Bruce 03/13/2009 14:56:26  _____________________________________________________________________  External Attachment:    Type:   Image     Comment:   External Document

## 2010-02-12 NOTE — Letter (Signed)
Summary: *Referral Letter  [facility]  [address] [place], [place] [address]   Phone: [phone]  Fax: [phone]    04/16/2009  Thank you in advance for agreeing to see my patient:  Ashlee [name] [address] [place], [place]  [address]  Phone: [phone]  Reason for Referral: 75 yo smoker with COPD, HTN, Hyperlipidemia and cerebrovascular disease who was recently seen with feelings of near syncope.  Her echo in July demonstrated normal LV function.  She has 40-59 % LICA stenosis by recent dopplers.  Her ekg has been abnormal with LAFB and RBBB.  She denies syncope.  She has some drops in her blood pressure from lying to sitting but no increase in heart rate.  However, with her multiple cardiac risk factors and conduction system disease, I would like her evaluated further.  Procedures Requested:   Current Medical Problems: 1)  EDEMA (ICD-782.3) 2)  DIZZINESS (ICD-780.4) 3)  UTI (ICD-599.0) 4)  HORMONE REPLACEMENT THERAPY (ICD-V07.4) 5)  DYSLIPIDEMIA (ICD-272.4) 6)  CAROTID ARTERY STENOSIS (ICD-433.10) 7)  TRANSAMINASES, SERUM, ELEVATED (ICD-790.4) 8)  ANEMIA, NORMOCYTIC (ICD-285.9) 9)  ESSENTIAL HYPERTENSION, BENIGN (ICD-401.1) 10)  CONTACT DERMATITIS (ICD-692.9) 11)  TICK BITE (ICD-E906.4) 12)  GAIT DISTURBANCE (ICD-781.2) 13)  SYNCOPE (ICD-780.2) 14)  POLYNEUROPATHY OTHER DISEASES CLASSIFIED ELSW (ICD-357.4) 15)  SCREENING FOR MALIGNANT NEOPLASM, CERVIX (ICD-V76.2) 16)  FOLATE-DEFICIENCY ANEMIA (ICD-281.2) 17)  BREAST MASS, RIGHT (ICD-611.72) 18)  SINUSITIS (ICD-473.9) 19)  LABRYNTHITIS (ICD-386.30) 20)  BRONCHITIS, OBSTRUCTIVE CHRONIC, WITH EXACERBATION (ICD-491.21) 21)  ACID REFLUX DISEASE (ICD-530.81) 22)  HEALTH MAINTENANCE EXAM (ICD-V70.0) 23)  COLONIC POLYPS (ICD-211.3) 24)  TUBAL LIGATION, HX OF (ICD-V26.51) 25)  BREAST BIOPSY, HX OF (ICD-V15.9) 26)  APPENDECTOMY, HX OF (ICD-V45.79) 27)  * FRACTURE RIGHT PUBIC RAMUS 28)   OSTEOPOROSIS NOS (ICD-733.00) 29)  ANEMIA, FOLATE-DEFICIENCY (ICD-281.2) 30)  LEUKOPLAKIA, VOCAL CORDS (ICD-478.5) 31)  SEIZURE DISORDER (ICD-780.39) 32)  PANIC ATTACK (ICD-300.01) 33)  POST TRAUMATIC STRESS SYNDROME (ICD-309.81) 34)  SMOKER (ICD-305.1) 35)  BRONCHITIS, RECURRENT (ICD-491.9) 36)  COPD (ICD-496) 37)  BRONCHOPNEUMONIA (ICD-485)   Current Medications: 1)  * XANAX 1 MG  TABS (ALPRAZOLAM) take 1/2  tablet by mouth 4x a day and 1 at bedtime 2)  PAXIL 20 MG  TABS (PAROXETINE HCL) take 1 tablet by mouth daily 3)  SPIRIVA HANDIHALER 18 MCG  CAPS (TIOTROPIUM BROMIDE MONOHYDRATE) inhale once daily 4)  TYLENOL EXTRA STRENGTH 500 MG  TABS (ACETAMINOPHEN) as needed 5)  EQ WOMANS LAXATIVE 5 MG  TBEC (BISACODYL) as needed 6)  PROAIR HFA 108 (90 BASE) MCG/ACT  AERS (ALBUTEROL SULFATE) as needed one to two puff every 4 hours as needed 7)  MIRALAX   POWD (POLYETHYLENE GLYCOL 3350) 17g by mouth once daily constipation. Mix powder with a liquid and drink 8)  * SHOWER CHAIR Falls risk 9)  * OXYGEN at bedtime 10)  * WALKER WITH SEAT Needed for Gait and balance dysfunction 11)  CENTRUM SILVER  CHEW (MULTIPLE VITAMINS-MINERALS) 1 by mouth daily 12)  FISH OIL 1000 MG CAPS (OMEGA-3 FATTY ACIDS) 1 by mouth daily 13)  ASPIRIN 325 MG TABS (ASPIRIN) once daily 14)  CALTRATE 600+D PLUS 600-400 MG-UNIT TABS (CALCIUM CARBONATE-VIT D-MIN) Take 1 tablet by mouth two times a day 15)  ACTONEL 150 MG TABS (RISEDRONATE SODIUM) 1 by mouth one time per month 16)  OMEPRAZOLE 40 MG CPDR (OMEPRAZOLE) Take 1 tablet by mouth once a day for stomach acid 17)  ADVAIR DISKUS 250-50 MCG/DOSE AEPB (FLUTICASONE-SALMETEROL) 1  puff two times a day 18)  PREDNISONE 10 MG  TABS (PREDNISONE) Take as directed 4 each am x3days, 3 x 3days, 2 x 3days, 1 x 3days then stop 19)  PRAVACHOL 40 MG TABS (PRAVASTATIN SODIUM) Take 1 tab by mouth at bedtime for cholesterol 20)  MICONAZOLE 3 200 MG SUPP (MICONAZOLE NITRATE) 1 supp per vagina  at bedtime x 3 nights 21)  LISINOPRIL 10 MG TABS (LISINOPRIL) Take 1 tablet by mouth once a day   Past Medical History: 1)  COLONIC POLYPS (ICD-211.3) 2)  OSTEOPOROSIS NOS (ICD-733.00) 3)  ANEMIA, FOLATE-DEFICIENCY (ICD-281.2) 4)  LEUKOPLAKIA, VOCAL CORDS (ICD-478.5) 5)  SEIZURE DISORDER (ICD-780.39) 6)  PANIC ATTACK (ICD-300.01) 7)  POST TRAUMATIC STRESS SYNDROME (ICD-309.81) 8)  SMOKER (ICD-305.1) 9)  BRONCHITIS, RECURRENT (ICD-491.9) 10)  COPD (ICD-496) 11)    -FeV1 64%  DLCO 42% 2005 12)  BRONCHOPNEUMONIA (ICD-485)   Prior History of Blood Transfusions:   Pertinent Labs:    Thank you again for agreeing to see our patient; please contact us if you have any further questions or need additional information.  Sincerely,  Tereso Newcomer PA-C

## 2010-02-12 NOTE — Progress Notes (Signed)
Summary: O2 form   Phone Note Outgoing Call   Summary of Call: Please see [facility] form in your basket. Please contact Dr. Lynelle Doctor office.  I think he is supposed to complete her O2 form.  If not, send back to me. Tereso Newcomer PA-C  August 08, 2009 3:50 PM   Follow-up for Phone Call        spoke with chantel at pulmo.  and she said Dr. Delford Field can file out the paper so i will fax the paper to them today Follow-up by: [place] Shannon,  August 11, 2009 10:44 AM

## 2010-02-12 NOTE — Assessment & Plan Note (Signed)
Summary: Pulmonary OV   Primary Provider/Referring Provider:  Tereso Newcomer PA-C  CC:  4 month follow up.  States breathing is doing "good."  SOB only occasionally with exertion.  Denies wheezing, chest tightness, and cough.  Smoking 1-2 cig per day.Ashlee [name]  History of Present Illness: Pulmonary:   75 yo WF with COPD and primary emphysema--current smoker  April 22, 2009 2:23 PM Recently more edema in feet,  now better with BP med change,  now on ace and off norvasc feels weak in legs.  no change in dyspnea.  quit smoking for two weeks,  then stressed over dog dying and now smoking 2-3 cigs/day.   Has a dry cough.  Notes dyspnea is at baseline.  July 23, 2009 11:51 AM This pt saw Cardiology  MD and was told her heart was "ok".   Not much cough.  Dsypnea is the same.  No hosp visits since last ov.   The pt still smokes 2-3 cigs/d.  There is no excess mucus or chest pain.   Pt denies any significant sore throat, nasal congestion or excess secretions, fever, chills, sweats, unintended weight loss, pleurtic or exertional chest pain, orthopnea PND, or leg swelling Pt denies any increase in rescue therapy over baseline, denies waking up needing it or having any early am or nocturnal exacerbations of coughing/wheezing/or dyspnea.   November 19, 2009 12:09 PM No new issues.  No cough. N ochest pain Still smokes 2-3 cigs per day Pt denies any significant sore throat, nasal congestion or excess secretions, fever, chills, sweats, unintended weight loss, pleurtic or exertional chest pain, orthopnea PND, or leg swelling Pt denies any increase in rescue therapy over baseline, denies waking up needing it or having any early am or nocturnal exacerbations of coughing/wheezing/or dyspnea.   Preventive Screening-Counseling & Management  Alcohol-Tobacco     Smoking Status: current     Packs/Day: <0.25  Current Medications (verified): 1)  Xanax 1 Mg Tabs (Alprazolam) .... 1/2 Tab By Mouth 4 Times Daily and 1  At Bedtime 2)  Paxil 20 Mg  Tabs (Paroxetine Hcl) .... Take 1 Tablet By Mouth Daily 3)  Spiriva Handihaler 18 Mcg  Caps (Tiotropium Bromide Monohydrate) .... Inhale Once Daily 4)  Eq Womans Laxative 5 Mg  Tbec (Bisacodyl) .... As Needed 5)  Miralax   Powd (Polyethylene Glycol 3350) .Ashlee [name].. 17g By Mouth Once Daily Constipation. Mix Powder With A Liquid and Drink 6)  Paediatric nurse .... Falls Risk 7)  Oxygen .... 2l At Bedtime 8)  Walker With Seat .... Needed For Gait and Balance Dysfunction 9)  Centrum Silver  Chew (Multiple Vitamins-Minerals) .Ashlee [name].. 1 By Mouth Daily 10)  Fish Oil 1000 Mg Caps (Omega-3 Fatty Acids) .Ashlee [name].. 1 By Mouth Daily 11)  Aspirin 81 Mg Tbec (Aspirin) .... Take One Tablet By Mouth Daily 12)  Caltrate 600+d Plus 600-400 Mg-Unit Tabs (Calcium Carbonate-Vit D-Min) .... Take 1 Tablet By Mouth Two Times A Day 13)  Actonel 150 Mg Tabs (Risedronate Sodium) .Ashlee [name].. 1 By Mouth One Time Per Month 14)  Advair Diskus 250-50 Mcg/dose Aepb (Fluticasone-Salmeterol) .Ashlee [name].. 1 Puff Two Times A Day 15)  Omeprazole 40 Mg Cpdr (Omeprazole) .... Take 1 Capsule By Mouth Once A Day For Stomach Acid 16)  Pravastatin Sodium 80 Mg Tabs (Pravastatin Sodium) .Ashlee [name].. 1 Tab By Mouth Daily 17)  Proair Hfa 108 (90 Base) Mcg/act Aers (Albuterol Sulfate) .Ashlee [name].. 1-2 Puffs Every 4 Hours As Needed 18)  Combivent 18-103 Mcg/act Aero (Ipratropium-Albuterol) .... As  Needed 19)  Singulair 10 Mg Tabs (Montelukast Sodium) .... Take 1 Tablet By Mouth Once A Day  Allergies (verified): 1)  ! * Fosmax 2)  ! * Aricept 3)  ! Lisinopril  Past History:  Past medical, surgical, family and social histories (including risk factors) reviewed, and no changes noted (except as noted below).  Past Medical History: Reviewed history from 07/30/2009 and no changes required. HYPERTENSION (ICD-401.9) ASTHMA (ICD-493.90) DYSLIPIDEMIA (ICD-272.4) CAROTID ARTERY STENOSIS (ICD-433.10) TRANSAMINASES, SERUM, ELEVATED (ICD-790.4) CONTACT DERMATITIS  (ICD-692.9) POLYNEUROPATHY OTHER DISEASES CLASSIFIED ELSW (ICD-357.4) FOLATE-DEFICIENCY ANEMIA (ICD-281.2) BREAST MASS, RIGHT (ICD-611.72) ACID REFLUX DISEASE (ICD-530.81) COLONIC POLYPS (ICD-211.3) FRACTURE RIGHT PUBIC RAMUS OSTEOPOROSIS NOS (ICD-733.00) ANEMIA, FOLATE-DEFICIENCY (ICD-281.2) LEUKOPLAKIA, VOCAL CORDS (ICD-478.5) SEIZURE DISORDER (ICD-780.39) PANIC ATTACK (ICD-300.01) COPD (ICD-496)   -FeV1 64%  DLCO 42% 2005 Renal Insufficiency   a.  creat up with ACE to 1.7 and K+ 5.5 . Ashlee [name] .ACE d/c 07/2009  Past Surgical History: Reviewed history from 05/19/2009 and no changes required. TUBAL LIGATION, HX OF (ICD-V26.51) BREAST BIOPSY, HX OF (ICD-V15.9) APPENDECTOMY, HX OF (ICD-V45.79) H/O c-section Cholecystectomy  Family History: Reviewed history from 05/19/2009 and no changes required. Mother died hemhorrage Father unknown Has 21 1/2-siblings daughters with thyroid problems. Son with MI in his 71's  Social History: Reviewed history from 05/19/2009 and no changes required. Lives with learning disabled daughter. Previous bartender. widowed Current Smoker Alcohol use-no  Review of Systems       The patient complains of shortness of breath with activity.  The patient denies shortness of breath at rest, productive cough, non-productive cough, coughing up blood, chest pain, irregular heartbeats, acid heartburn, indigestion, loss of appetite, weight change, abdominal pain, difficulty swallowing, sore throat, tooth/dental problems, headaches, nasal congestion/difficulty breathing through nose, sneezing, itching, ear ache, anxiety, depression, hand/feet swelling, joint stiffness or pain, rash, change in color of mucus, and fever.    Vital Signs:  Patient profile:   75 year old female Height:      65 inches Weight:      146.13 pounds BMI:     24.41 O2 Sat:      89 % on Room air Temp:     97.4 degrees F oral Pulse rate:   102 / minute BP sitting:   140 / 78  (left  arm) Cuff size:   regular  Vitals Entered By: Gweneth Dimitri RN (November 19, 2009 11:59 AM)  O2 Flow:  Room air  O2 Sat Comments Pt arrived to exam room with o2 sat 89% ra.  After resting for a minute, o2 sat increased to 93% RA with pulse of 93. Gweneth Dimitri RN  November 19, 2009 12:06 PM  CC: 4 month follow up.  States breathing is doing "good."  SOB only occasionally with exertion.  Denies wheezing, chest tightness, cough.  Smoking 1-2 cig per day. Comments Medications reviewed with patient Daytime contact number verified with patient. Gweneth Dimitri RN  November 19, 2009 12:00 PM    Physical Exam  Additional Exam:  Gen: Pleasant, well nourished, in no distress ENT: no lesions, no postnasal drip Neck: No JVD, no TMG, no carotid bruits Lungs: no use of accessory muscles,distant BS,  poor airflow, no rhonchi. Cardiovascular: RRR, heart sounds normal, no murmurs or gallops, no peripheral edema Musculoskeletal: No deformities, no cyanosis or clubbing     Impression & Recommendations:  Problem # 1:  COPD (ICD-496) Assessment Unchanged copd golds stage III plan No change in inhaled medications.   Maintain  treatment program as currently prescribed.  Medications Added to Medication List This Visit: 1)  Xanax 1 Mg Tabs (Alprazolam) .... 1/2 tab by mouth 4 times daily and 1 at bedtime 2)  Proair Hfa 108 (90 Base) Mcg/act Aers (Albuterol sulfate) .Ashlee [name].. 1-2 puffs every 4 hours as needed 3)  Combivent 18-103 Mcg/act Aero (Ipratropium-albuterol) .... As needed 4)  Singulair 10 Mg Tabs (Montelukast sodium) .... Take 1 tablet by mouth once a day  Complete Medication List: 1)  Xanax 1 Mg Tabs (Alprazolam) .... 1/2 tab by mouth 4 times daily and 1 at bedtime 2)  Paxil 20 Mg Tabs (Paroxetine hcl) .... Take 1 tablet by mouth daily 3)  Spiriva Handihaler 18 Mcg Caps (Tiotropium bromide monohydrate) .... Inhale once daily 4)  Eq Womans Laxative 5 Mg Tbec (Bisacodyl) .... As needed 5)   Miralax Powd (Polyethylene glycol 3350) .Ashlee [name].. 17g by mouth once daily constipation. mix powder with a liquid and drink 6)  Paediatric nurse  .... Falls risk 7)  Oxygen  .... 2l at bedtime 8)  Walker With Seat  .... Needed for gait and balance dysfunction 9)  Centrum Silver Chew (Multiple vitamins-minerals) .Ashlee [name].. 1 by mouth daily 10)  Fish Oil 1000 Mg Caps (Omega-3 fatty acids) .Ashlee [name].. 1 by mouth daily 11)  Aspirin 81 Mg Tbec (Aspirin) .... Take one tablet by mouth daily 12)  Caltrate 600+d Plus 600-400 Mg-unit Tabs (Calcium carbonate-vit d-min) .... Take 1 tablet by mouth two times a day 13)  Actonel 150 Mg Tabs (Risedronate sodium) .Ashlee [name].. 1 by mouth one time per month 14)  Advair Diskus 250-50 Mcg/dose Aepb (Fluticasone-salmeterol) .Ashlee [name].. 1 puff two times a day 15)  Omeprazole 40 Mg Cpdr (Omeprazole) .... Take 1 capsule by mouth once a day for stomach acid 16)  Pravastatin Sodium 80 Mg Tabs (Pravastatin sodium) .Ashlee [name].. 1 tab by mouth daily 17)  Proair Hfa 108 (90 Base) Mcg/act Aers (Albuterol sulfate) .Ashlee [name].. 1-2 puffs every 4 hours as needed 18)  Combivent 18-103 Mcg/act Aero (Ipratropium-albuterol) .... As needed 19)  Singulair 10 Mg Tabs (Montelukast sodium) .... Take 1 tablet by mouth once a day  Other Orders: Est. Patient Level III ([address])  Patient Instructions: 1)  No change in medications 2)  Return in    5      months   Prevention & Chronic Care Immunizations   Influenza vaccine: Fluvax MCR  (10/29/2009)    Tetanus booster: 07/29/2009: Tdap    Pneumococcal vaccine: Pneumovax  (07/31/2007)    H. zoster vaccine: Not documented  Colorectal Screening   Hemoccult: Negative  (09/11/2004)    Colonoscopy: Abnormal  (06/10/2005)  Other Screening   Pap smear: normal  (08/01/2007)    Mammogram: No specific mammographic evidence of malignancy.  Assessment: BIRADS 2.   (06/24/2009)   Mammogram action/deferral: Screening mammogram in 1 year.     (06/24/2009)    DXA bone density scan:  abnormal  (09/02/2008)   Smoking status: current  (11/19/2009)   Smoking cessation counseling: YES  (07/23/2009)  Lipids   Total Cholesterol: 191  (10/29/2009)   LDL: 114  (10/29/2009)   LDL Direct: Not documented   HDL: 54  (10/29/2009)   Triglycerides: 113  (10/29/2009)    SGOT (AST): 25  (10/29/2009)   SGPT (ALT): 17  (10/29/2009)   Alkaline phosphatase: 91  (10/29/2009)   Total bilirubin: 0.4  (10/29/2009)  Hypertension   Last Blood Pressure: 140 / 78  (11/19/2009)   Serum creatinine: 1.23  (08/22/2009)  Serum potassium 3.8  (08/22/2009)  Self-Management Support :    Hypertension self-management support: Not documented    Lipid self-management support: Not documented

## 2010-02-23 ENCOUNTER — Encounter: Payer: Self-pay | Admitting: Critical Care Medicine

## 2010-03-10 NOTE — Letter (Signed)
Summary: [facility]  [facility]   Imported By: Sherian Rein 03/02/2010 12:15:30  _____________________________________________________________________  External Attachment:    Type:   Image     Comment:   External Document

## 2010-03-16 ENCOUNTER — Telehealth (INDEPENDENT_AMBULATORY_CARE_PROVIDER_SITE_OTHER): Payer: Self-pay | Admitting: Nurse Practitioner

## 2010-03-24 NOTE — Progress Notes (Signed)
Summary: NEED NOTE FOR TRANSPORTATION  Phone Note Call from Patient Call back at Home Phone [phone]   Summary of Call: PATIENT SAID PUBLIC TRANSPORTATION NEEDS A NOTE FROM THE DR TODAY STATING THAT MS Michelli HAS TO RIDE WITH HER DAUGHTER WHEN SHE HAS APPTS AND HER DAUGHTER HAS TO RIDE WITH HER WHEN SHE HAS APPTS.  MS Neels SAID THE APPTS WILL BE CANCELED IF TRANSPORTATION DOES NOT HEAR FROM THE DR TODAY.  THE TRANSPORTATION PH# [phone] AND THE FAX [phone] Initial call taken by: Ayesha Rumpf,  March 16, 2010 3:09 PM  Follow-up for Phone Call        Sent to N. Daphine Deutscher.  Dutch Quint RN  March 16, 2010 3:28 PM   Additional Follow-up for Phone Call Additional follow up Details #1::        Just seeing this.Marland KitchenMarland KitchenMarland KitchenThey have been coming with each other for convience but is NOT absolutely necessary.   Additional Follow-up by: Lehman Prom FNP,  March 17, 2010 3:11 PM    Additional Follow-up for Phone Call Additional follow up Details #2::    Advised of provider's response -- pt. wanted to know if they CAN ride together -- I advised her that she needs to check with the transportation company and ask them.  Verbalized understanding and agreement. Dutch Quint RN  March 17, 2010 3:34 PM

## 2010-04-10 ENCOUNTER — Other Ambulatory Visit: Payer: Self-pay | Admitting: Cardiology

## 2010-04-13 LAB — COMPREHENSIVE METABOLIC PANEL
ALT: 32 U/L (ref 0–35)
ALT: 45 U/L — ABNORMAL HIGH (ref 0–35)
ALT: 55 U/L — ABNORMAL HIGH (ref 0–35)
AST: 57 U/L — ABNORMAL HIGH (ref 0–37)
Albumin: 2.4 g/dL — ABNORMAL LOW (ref 3.5–5.2)
Albumin: 3.1 g/dL — ABNORMAL LOW (ref 3.5–5.2)
Alkaline Phosphatase: 107 U/L (ref 39–117)
Alkaline Phosphatase: 110 U/L (ref 39–117)
Alkaline Phosphatase: 89 U/L (ref 39–117)
Alkaline Phosphatase: 93 U/L (ref 39–117)
BUN: 13 mg/dL (ref 6–23)
BUN: 14 mg/dL (ref 6–23)
BUN: 22 mg/dL (ref 6–23)
CO2: 22 mEq/L (ref 19–32)
CO2: 23 mEq/L (ref 19–32)
CO2: 23 mEq/L (ref 19–32)
Calcium: 8.4 mg/dL (ref 8.4–10.5)
Calcium: 9.8 mg/dL (ref 8.4–10.5)
Chloride: 113 mEq/L — ABNORMAL HIGH (ref 96–112)
Chloride: 113 mEq/L — ABNORMAL HIGH (ref 96–112)
GFR calc Af Amer: 47 mL/min — ABNORMAL LOW (ref >60)
GFR calc non Af Amer: 37 mL/min — ABNORMAL LOW (ref >60)
GFR calc non Af Amer: 39 mL/min — ABNORMAL LOW (ref >60)
GFR calc non Af Amer: 43 mL/min — ABNORMAL LOW (ref >60)
Glucose, Bld: 120 mg/dL — ABNORMAL HIGH (ref 70–99)
Glucose, Bld: 168 mg/dL — ABNORMAL HIGH (ref 70–99)
Glucose, Bld: 95 mg/dL (ref 70–99)
Potassium: 3.8 mEq/L (ref 3.5–5.1)
Potassium: 4 mEq/L (ref 3.5–5.1)
Potassium: 4.1 mEq/L (ref 3.5–5.1)
Potassium: 4.1 mEq/L (ref 3.5–5.1)
Sodium: 139 mEq/L (ref 135–145)
Sodium: 143 mEq/L (ref 135–145)
Total Bilirubin: 0.1 mg/dL — ABNORMAL LOW (ref 0.3–1.2)
Total Bilirubin: 0.2 mg/dL — ABNORMAL LOW (ref 0.3–1.2)
Total Protein: 5.8 g/dL — ABNORMAL LOW (ref 6.0–8.3)
Total Protein: 6 g/dL (ref 6.0–8.3)
Total Protein: 7.2 g/dL (ref 6.0–8.3)

## 2010-04-13 LAB — CBC
HCT: 28.2 % — ABNORMAL LOW (ref 36.0–46.0)
HCT: 34.6 % — ABNORMAL LOW (ref 36.0–46.0)
Hemoglobin: 12.1 g/dL (ref 12.0–15.0)
Hemoglobin: 9.9 g/dL — ABNORMAL LOW (ref 12.0–15.0)
MCHC: 35 g/dL (ref 30.0–36.0)
MCHC: 35 g/dL (ref 30.0–36.0)
MCV: 97.9 fL (ref 78.0–100.0)
Platelets: 236 10*3/uL (ref 150–400)
Platelets: 238 10*3/uL (ref 150–400)
RBC: 2.93 MIL/uL — ABNORMAL LOW (ref 3.87–5.11)
RBC: 2.98 MIL/uL — ABNORMAL LOW (ref 3.87–5.11)
RDW: 13.1 % (ref 11.5–15.5)
RDW: 13.4 % (ref 11.5–15.5)
WBC: 9.5 10*3/uL (ref 4.0–10.5)

## 2010-04-13 LAB — URINALYSIS, ROUTINE W REFLEX MICROSCOPIC
Bilirubin Urine: NEGATIVE
Glucose, UA: NEGATIVE mg/dL
Hgb urine dipstick: NEGATIVE
Ketones, ur: NEGATIVE mg/dL
Nitrite: NEGATIVE
pH: 6 (ref 5.0–8.0)

## 2010-04-13 LAB — DIFFERENTIAL
Basophils Relative: 0 % (ref 0–1)
Lymphocytes Relative: 8 % — ABNORMAL LOW (ref 12–46)
Lymphs Abs: 1.5 10*3/uL (ref 0.7–4.0)
Monocytes Absolute: 1.4 10*3/uL — ABNORMAL HIGH (ref 0.1–1.0)
Monocytes Relative: 8 % (ref 3–12)
Neutro Abs: 16.1 10*3/uL — ABNORMAL HIGH (ref 1.7–7.7)
Neutrophils Relative %: 84 % — ABNORMAL HIGH (ref 43–77)

## 2010-04-13 LAB — LIPASE, BLOOD: Lipase: 19 U/L (ref 11–59)

## 2010-04-13 LAB — URINE CULTURE: Colony Count: 3000

## 2010-04-13 LAB — CARDIAC PANEL(CRET KIN+CKTOT+MB+TROPI)
CK, MB: 5.1 ng/mL — ABNORMAL HIGH (ref 0.3–4.0)
Relative Index: 1.9 (ref 0.0–2.5)
Troponin I: 0.02 ng/mL (ref 0.00–0.06)

## 2010-04-13 LAB — TSH: TSH: 0.837 u[IU]/mL (ref 0.350–4.500)

## 2010-04-13 LAB — POCT CARDIAC MARKERS: Troponin i, poc: <0.05 ng/mL (ref 0.00–0.09)

## 2010-04-13 LAB — CK TOTAL AND CKMB (NOT AT ARMC): Total CK: 159 U/L (ref 7–177)

## 2010-04-13 LAB — BRAIN NATRIURETIC PEPTIDE: Pro B Natriuretic peptide (BNP): 88 pg/mL (ref 0.0–100.0)

## 2010-04-20 ENCOUNTER — Telehealth: Payer: Self-pay | Admitting: Critical Care Medicine

## 2010-04-20 MED ORDER — FLUTICASONE-SALMETEROL 250-50 MCG/DOSE IN AEPB: INHALATION_SPRAY | RESPIRATORY_TRACT | Status: DC

## 2010-04-20 NOTE — Telephone Encounter (Signed)
Pt aware rx was sent to pharmacy and needed nothing else further 

## 2010-04-23 NOTE — Telephone Encounter (Signed)
Please send to Lehman Prom, NP,  Tereso Newcomer PA no longer with [employer]

## 2010-05-05 ENCOUNTER — Encounter: Payer: Self-pay | Admitting: Critical Care Medicine

## 2010-05-06 ENCOUNTER — Ambulatory Visit: Payer: Self-pay | Admitting: Critical Care Medicine

## 2010-05-21 ENCOUNTER — Encounter: Payer: Self-pay | Admitting: Vascular Surgery

## 2010-05-26 NOTE — Assessment & Plan Note (Signed)
[facility]                             PULMONARY OFFICE NOTE   BEXLEY, LAUBACH                         MRN:          [id number]  DATE:10/27/2006                            DOB:          1935-12-22    HISTORY OF PRESENT ILLNESS:  Patient is a 75 year old white female  patient of Dr. Lynelle Doctor who has a known history of emphysematous COPD,  presents for an acute office visit.  Patient complains over the last 2  weeks of increased productive cough with thick brown sputum, nasal  discharge and intermittent wheezing.  Patient denies any hemoptysis,  orthopnea, PND or leg swelling.  Patient does notice that her symptoms  have slightly improved over the last 2 days.  Patient does continue to  smoke approximately 6 cigarettes a day.   PAST MEDICAL HISTORY:  Reviewed.   CURRENT MEDICATIONS:  Reviewed.   PHYSICAL EXAMINATION:  Patient is a female in no acute distress.  She is  afebrile with stable vital signs.  O2 saturation 94% on room air.  HEENT:  Unremarkable.  NECK:  Supple without adenopathy, no JVD.  LUNGS:  Sounds are diminished in the bases without any wheezing or  crackles.  CARDIAC:  Regular rate.  ABDOMEN:  Soft and nontender.  EXTREMITIES:  Warm without any edema.   IMPRESSION AND PLAN:  Acute tracheobronchitis in a patient with  underlying severe chronic obstructive pulmonary disease.  Patient was  given a prescription for doxycycline to have on hold for 7 days if  symptoms worsen with purulent sputum.  Patient is to use Mucinex DM  twice a day.  May have Tussionex #4 ounces one-half to one teaspoon  every 12 hours as needed for cough.  Patient is aware of sedating  effect.  Patient was given an influenza vaccine today.  Patient will  return back with Dr. Delford Field in 6-8 weeks or sooner if needed.      Rubye Oaks, NP  Electronically Signed      Charlcie Cradle Delford Field, MD, [facility]  Electronically Signed   TP/MedQ  DD: 10/27/2006  DT:  10/28/2006  Job #: [id number]

## 2010-05-29 NOTE — Op Note (Signed)
NAMEMarland Kitchen  Ashlee [name], Ashlee [name]                  ACCOUNT NO.:  [id number]   MEDICAL RECORD NO.:  [id number]          PATIENT TYPE:  OIB   LOCATION:  2899                         FACILITY:  [facility]   PHYSICIAN:  Angelia Mould. Derrell Lolling, M.D.DATE OF BIRTH:  05/25/1935   DATE OF PROCEDURE:  12/03/2003  DATE OF DISCHARGE:                                 OPERATIVE REPORT   PREOPERATIVE DIAGNOSIS:  Chronic cholecystitis with cholelithiasis.   POSTOPERATIVE DIAGNOSIS:  Chronic cholecystitis with cholelithiasis.   OPERATION PERFORMED:  Laparoscopic cholecystectomy with intraoperative  cholangiogram.   SURGEON:  Angelia Mould. Derrell Lolling, M.D.   ASSISTANT:  Sandria Bales. Ezzard Standing, M.D.   ANESTHESIA:  General.   INDICATIONS FOR PROCEDURE:  The patient is a 75 year old white female with  moderately severe chronic obstructive pulmonary disease and seizure  disorder.  She has a several year history of intermittent episodes of  epigastric and right upper quadrant pain and nausea, gallbladder ultrasound  shows numerous gallstones.  Common bile duct was normal.  Liver function  tests showed an elevated alkaline phosphatase but otherwise normal.  She was  seen in the office and elective cholecystectomy was advised.  She was  operated on electively.   OPERATIVE FINDINGS:  The patient had moderately severe adhesions throughout  the lower abdomen and right pericolic gutter and there was also extensive  omental adhesions to the gallbladder.  The gallbladder was thick walled and  discolored.  The gallbladder contained numerous large multifaceted stones.  The cystic duct was small.  The cholangiogram was normal showing normal  intrahepatic and extrahepatic anatomy, no evidence of dilatation of the bile  ducts, no evidence of filling defects and prompt flow of contrast into the  duodenum.  The stomach and duodenum, liver and peritoneal surfaces,  transverse colon otherwise looked normal.   DESCRIPTION OF PROCEDURE:  Following the  induction of general endotracheal  anesthesia, the patient's abdomen was prepped and draped in sterile fashion.  0.5% Marcaine with epinephrine was used as a local infiltration anesthetic.  Vertically oriented incision was made at the lower rim of the umbilicus.  The fascia was incised in the midline and the abdominal cavity entered under  direct vision.  A 10 mm Hasson trocar was inserted and secured with a  pursestring suture of 0  Vicryl.  Pneumoperitoneum was created.  Video  camera was inserted with visualization and findings as described above.  A  10 mm trocar was placed in the subxiphoid region and using sharp scissors,  cautery dissection, we took down some adhesions in the right upper quadrant  off of the peritoneal surfaces.  That gave [place] enough room to place two 5 mm  trocars in the right upper quadrant.  We were then able to gently elevate  the fundus of the gallbladder and with countertraction, take all of the  adhesions down off of the gallbladder using sharp dissection and cautery.  This was uneventful dissection.  We were then able to identify the  infundibulum of the gallbladder and retract it laterally.  We dissected out  the cystic duct and the cystic  artery.  The cystic artery was isolated as it  went onto the wall of the gallbladder, secured with multiple metal clips and  divided.  This created a large window behind the cystic duct.  The metal  clip was placed on the cystic duct close to the gallbladder.  A  cholangiogram catheter was inserted.  A cholangiogram was obtained using the  C-arm.  This showed normal intrahepatic and extrahepatic anatomy, no filling  defects and no obstruction.  The cholangiogram catheter was removed.  The  cystic duct was secured with multiple metal clips and divided.  The  gallbladder was dissected from its bed with electrocautery and removed  through the umbilical port.  The operative field was inspected and  irrigated.  Everything was  completely hemostatic and there was no bile leak  whatsoever.  The irrigation fluid became completely clear.  We checked all  the areas of dissection and there was no bleeding.  The trocars were removed  and there was no bleeding from the trocar sites.  The pneumoperitoneum was  released.  The fascia at the umbilicus was closed with 0 Vicryl sutures.  Skin incision was closed with subcuticular sutures of 4-0 Vicryl and Steri-  Strips.  Clean bandages were placed and the patient was taken to the  recovery room in stable condition.  The estimated blood loss was about 20  mL. Complications were none.  Sponge, needle and instrument counts were  correct.      Hayw   HMI/MEDQ  D:  12/03/2003  T:  12/03/2003  Job:  [id number]   cc:   Dineen Kid. Reche Dixon, M.D.  [address] [place]  [place] [address]  Fax: [phone]

## 2010-05-29 NOTE — Assessment & Plan Note (Signed)
[facility]                               PULMONARY OFFICE NOTE   Ashlee [name], Ashlee [name]                         MRN:          [id number]  DATE:11/09/2005                            DOB:          [birth year]    Ashlee [name] is a 75 year old white female with advanced chronic obstructive  lung disease, reflux disease, still smoking 3 cigarettes a day.   MEDICATIONS:  1. Advair 250/50 one spray b.i.d.,  2. Spiriva 1 capsule daily.  3. Albuterol by nebulization 3 times a day.  4. Fosamax weekly.  5. Omeprazole 20 mg daily.   PHYSICAL EXAMINATION:  VITAL SIGNS: Temperature 97, blood pressure 138/80,  pulse 88, saturation 96% on room air.  CHEST: Showed diminished breath sounds with prolonged expiratory phase.  No  wheeze or rhonchi were noted.  CARDIAC: Regular rate and rhythm without S1.  Normal S1 and S2.  ABDOMEN:  Soft, nontender.  EXTREMITIES: Showed no edema or clubbing.  SKIN: Was clear.  NEUROLOGIC: Exam was intact.   IMPRESSION:  Chronic obstructive lung disease with primary emphysematous  component.   PLAN:  1. Maintain inhaled medicines as currently dosed.  2. Return this patient for visit in 2 months for followup.  3. Administer flu vaccine.  4. Obtain thoracic spine films for back pain.     Charlcie Cradle Delford Field, MD, FCCP    PEW/MedQ  DD: 11/09/2005  DT: 11/09/2005  Job #: [id number]   cc:   Dr.  Luciana Axe, [facility]
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
## 2010-06-11 ENCOUNTER — Encounter (INDEPENDENT_AMBULATORY_CARE_PROVIDER_SITE_OTHER): Payer: Medicare Other

## 2010-06-11 ENCOUNTER — Encounter (INDEPENDENT_AMBULATORY_CARE_PROVIDER_SITE_OTHER): Payer: Medicare Other | Admitting: Vascular Surgery

## 2010-06-11 DIAGNOSIS — I739 Peripheral vascular disease, unspecified: Secondary | ICD-10-CM

## 2010-06-11 DIAGNOSIS — I7092 Chronic total occlusion of artery of the extremities: Secondary | ICD-10-CM

## 2010-06-11 DIAGNOSIS — I779 Disorder of arteries and arterioles, unspecified: Secondary | ICD-10-CM

## 2010-06-11 NOTE — Procedures (Unsigned)
LOWER EXTREMITY ARTERIAL DUPLEX  INDICATION:  Claudication.  HISTORY: Diabetes:  No. Cardiac:  No. Hypertension:  Yes. Smoking:  Yes. Previous Surgery:  No.  SINGLE LEVEL ARTERIAL EXAM                         RIGHT                LEFT Brachial:               169                  167 Anterior tibial:        139                  163 Posterior tibial:       149                  165 Peroneal: Ankle/Brachial Index:   0.88                 0.98  LOWER EXTREMITY ARTERIAL DUPLEX EXAM  DUPLEX:  Patent right lower extremity with triphasic waveforms and moderate atherosclerosis visualized.  Velocities appear within normal limits.  Trunk is patent with decreased amplitudes and anterior tibial origin in the tibioperoneal origin, probably due to diffuse scattered plaque.  IMPRESSION:  Ankle brachial indices appear stable.  Right lower extremity as described above.  ___________________________________________ Janetta Hora. Fields, MD  OD/MEDQ  D:  06/11/2010  T:  06/11/2010  Job:  [id number]

## 2010-06-12 ENCOUNTER — Ambulatory Visit: Payer: Self-pay | Admitting: Critical Care Medicine

## 2010-06-12 NOTE — Assessment & Plan Note (Signed)
OFFICE VISIT  Ashlee [name], Ashlee [name] DOB:  Jan 21, 1935                                       06/11/2010 ZOXWR#:[id number]  CHIEF COMPLAINT:  Leg pain.  HISTORY OF PRESENT ILLNESS:  The patient is a 75 year old female referred by Lehman Prom, FNP with [facility] for chief complaint of leg pain.  The patient describes an 18 month history of pain that extends from her right toes up the anterior aspect of both legs up to the level of her knee.  She states this started after she passed out 18 months ago when her blood pressure got low from medication.  The pain starts in the toes and works up the front of her leg.  It always occurs with walking.  The right leg is worse than the left.  This usually occurs after walking approximately 2 blocks with her dogs.  The pain is fairly consistent and predictable on the length of time it will take before her legs begin to hurt.  The patient is a long-term smoker and has smoked for 61 years.  She currently smokes a pack of cigarettes per week.  She also has elevated cholesterol.  Other chronic medical problems include COPD requiring home oxygen at night.  The patient denies rest pain.  The patient denies history of nonhealing ulcers on the feet.  PAST MEDICAL HISTORY:  Otherwise unremarkable.  PAST SURGICAL HISTORY:  Bilateral lumpectomies for benign breast tissue, appendectomy, cholecystectomy, C-section x2.  SOCIAL HISTORY:  She is widowed.  She has five children.  She is retired.  She smokes a pack of cigarettes per week.  She does not consume alcohol regularly.  FAMILY HISTORY:  Not remarkable for early vascular disease at age less than 5.  REVIEW OF SYSTEMS:  She is 5 feet 5 inches, 140 pounds. VASCULAR:  As listed above. GI:  She has some occasional dysphagia. PULMONARY:  She is on home oxygen. MUSCULOSKELETAL:  She has multiple joint and arthritis. PSYCHIATRIC:  She has mild depression. Cardiac, hematologic,  urinary, neurologic, ENT and skin review of systems are all negative.  MEDICATIONS:  Advair, alprazolam, omeprazole, MiraLAX, calcium, pravastatin, Paxil, Spiriva.  ALLERGIES:  She has allergies listed to lisinopril, Fosamax and Aricept but was unclear on exactly what her reaction to these was; she thought that it may have been rash.  PHYSICAL EXAM:  Vital signs:  Blood pressure is 138/82 in the left arm, heart rate 101 and regular, oxygen saturation is 94% on room air. HEENT:  Unremarkable.  Neck:  Has 2+ carotid pulses without bruit. Chest:  Clear to auscultation.  Cardiac:  Regular rate and rhythm without murmur.  Abdomen:  Soft, nontender, nondistended.  No masses. Extremities:  She has 2+ radial, femoral and posterior tibial pulses bilaterally.  She has absent dorsalis pedis pulse bilaterally.  She has no significant lower extremity edema.  Musculoskeletal:  She has no obvious major joint deformities.  Neurological:  She has 5/5 symmetric upper and lower extremity motor strength.  Skin:  Has no open ulcers or rashes.  She had bilateral ABIs performed today which I reviewed and interpreted. ABI on the right side was 0.88, on the left was 0.98.  She had diffusely scattered plaque in the distal popliteal and takeoff of the tibial vessels on the right side which most likely explains her slightly decreased ABI.  There was  no obvious focal flow limiting stenosis.  The patient has mild peripheral arterial disease.  Her ABIs certainly are not in the range that I would attribute her lower extremity symptoms to arterial occlusive disease alone.  Her ABI on the right side was slightly diminished with some plaque buildup but no flow limiting stenosis.  Some component of her lower extremity symptoms may be related to PAD on the right side but this certainly does not explain her left leg symptoms and she actually has palpable pulses in both lower extremities bilaterally.  I do not believe a  vascular intervention is warranted at this time.  I believe the best management for now would be continued risk factor modification and again smoking cessation was emphasized as well as a walking program of 30 minutes daily.  She will follow up with me on an as-needed basis.    Ashlee Hora. Naythen Heikkila, MD Electronically Signed  CEF/MEDQ  D:  06/11/2010  T:  06/12/2010  Job:  [id number]  cc:   Clinic [facility]

## 2010-06-23 ENCOUNTER — Encounter: Payer: Self-pay | Admitting: Critical Care Medicine

## 2010-06-23 ENCOUNTER — Ambulatory Visit (INDEPENDENT_AMBULATORY_CARE_PROVIDER_SITE_OTHER): Payer: Medicare Other | Admitting: Critical Care Medicine

## 2010-06-23 ENCOUNTER — Other Ambulatory Visit: Payer: Self-pay | Admitting: Critical Care Medicine

## 2010-06-23 VITALS — BP 106/76 | HR 78 | Temp 97.6°F | Ht 65.5 in | Wt 146.0 lb

## 2010-06-23 DIAGNOSIS — J449 Chronic obstructive pulmonary disease, unspecified: Secondary | ICD-10-CM

## 2010-06-23 MED ORDER — FLUTICASONE-SALMETEROL 250-50 MCG/DOSE IN AEPB: 1.0000 | INHALATION_SPRAY | Freq: Two times a day (BID) | RESPIRATORY_TRACT | Status: DC

## 2010-06-23 MED ORDER — ALBUTEROL SULFATE HFA 108 (90 BASE) MCG/ACT IN AERS: 2.0000 | INHALATION_SPRAY | Freq: Four times a day (QID) | RESPIRATORY_TRACT | Status: DC | PRN

## 2010-06-23 NOTE — Progress Notes (Signed)
Subjective:    Patient ID: Ashlee [name], female    DOB: [birth year], 75 y.o.   MRN: [id number]  HPI Pulmonary: 75 y.o.  WF with COPD and primary emphysema--current smoker    06/23/2010  No problems since 11/11 ov. No real cough.  Dyspnea is at baseline.  Smokes 1-2 cigs a day. Pt denies any significant sore throat, nasal congestion or excess secretions, fever, chills, sweats, unintended weight loss, pleurtic or exertional chest pain, orthopnea PND, or leg swelling Pt denies any increase in rescue therapy over baseline, denies waking up needing it or having any early am or nocturnal exacerbations of coughing/wheezing/or dyspnea. Pt also denies any obvious fluctuation in symptoms with  weather or environmental change or other alleviating or aggravating factors  Past Medical History  Diagnosis Date  . HTN (hypertension)   . Asthma   . Dyslipidemia   . Carotid stenosis   . Nonspecific elevation of levels of transaminase or lactic acid dehydrogenase (LDH)   . Contact dermatitis   . Polyneuropathy in other diseases classified elsewhere   . Folate-deficiency anemia   . Lump or mass in breast   . Acid reflux disease   . Colonic polyp   . Fracture of right pubis   . Osteoporosis   . Vocal cord leukoplakia   . Seizure disorder   . Panic attack   . COPD (chronic obstructive pulmonary disease)     fev1  64% DLCO 42% 2005  . Renal insufficiency     creat up with ACE to 1.7 and k+ 5.5..ACE d/c 07/2009     Family History  Problem Relation Age of Onset  . Other Mother     hemorrhage  . Thyroid disease Daughter   . Heart attack Son      History   Social History  . Marital Status: Widowed    Spouse Name: N/A    Number of Children: 2  . Years of Education: N/A   Occupational History  . previous bartender    Social History Main Topics  . Smoking status: Current Everyday Smoker    Types: Cigarettes  . Smokeless tobacco: Never Used   Comment: currently smoking 1-2 cig/day  .  Alcohol Use: Not on file  . Drug Use: Not on file  . Sexually Active: Not on file   Other Topics Concern  . Not on file   Social History Narrative  . No narrative on file     Allergies  Allergen Reactions  . Alendronate Sodium     REACTION: indigestion  . Donepezil Hydrochloride     REACTION: SOB, nausea  . Lisinopril     REACTION: elevated creatinine     Outpatient Prescriptions Prior to Visit  Medication Sig Dispense Refill  . ALPRAZolam (XANAX) 1 MG tablet 1/2 tablet by mouth 4 times daily and 1 at bedtime       . aspirin 81 MG tablet Take 81 mg by mouth daily.        . bisacodyl (EQ WOMANS LAXATIVE) 5 MG EC tablet Take 5 mg by mouth daily as needed.        . Calcium Carbonate-Vitamin D (CALTRATE 600+D) 600-400 MG-UNIT per tablet Take 1 tablet by mouth 2 (two) times daily.        . montelukast (SINGULAIR) 10 MG tablet Take 10 mg by mouth at bedtime.        . Multiple Vitamins-Minerals (CENTRUM SILVER PO) Take 1 tablet by mouth daily.        Marland Kitchen  Omega-3 Fatty Acids (FISH OIL) 1000 MG CAPS Take 1 capsule by mouth daily.        Marland Kitchen omeprazole (PRILOSEC) 40 MG capsule Take 40 mg by mouth daily.        Marland Kitchen PARoxetine (PAXIL) 20 MG tablet Take 20 mg by mouth every morning.        . polyethylene glycol powder (MIRALAX) powder Take 17 g by mouth daily.        . pravastatin (PRAVACHOL) 80 MG tablet Take 80 mg by mouth daily.        Marland Kitchen tiotropium (SPIRIVA) 18 MCG inhalation capsule Place 18 mcg into inhaler and inhale daily.        . traMADol (ULTRAM) 50 MG tablet Take 50 mg by mouth every 6 (six) hours as needed.        Marland Kitchen albuterol (PROAIR HFA) 108 (90 BASE) MCG/ACT inhaler Inhale 2 puffs into the lungs every 6 (six) hours as needed.        Marland Kitchen albuterol-ipratropium (COMBIVENT) 18-103 MCG/ACT inhaler Inhale 2 puffs into the lungs every 6 (six) hours as needed.        . Fluticasone-Salmeterol (ADVAIR DISKUS) 250-50 MCG/DOSE AEPB Inhale 1 puff into the lungs every 12 (twelve) hours.        .  risedronate (ACTONEL) 150 MG tablet Take 150 mg by mouth every 30 (thirty) days. with water on empty stomach, nothing by mouth or lie down for next 30 minutes.          Review of Systems Constitutional:   No  weight loss, night sweats,  Fevers, chills, fatigue, lassitude. HEENT:   No headaches,  Difficulty swallowing,  Tooth/dental problems,  Sore throat,                No sneezing, itching, ear ache, nasal congestion, post nasal drip,   CV:  No chest pain,  Orthopnea, PND, swelling in lower extremities, anasarca, dizziness, palpitations  GI  No heartburn, indigestion, abdominal pain, nausea, vomiting, diarrhea, change in bowel habits, loss of appetite  Resp: Notes shortness of breath with exertion not at rest.  No excess mucus, no productive cough,  Notes  non-productive cough,  No coughing up of blood.  No change in color of mucus.  No wheezing.  No chest wall deformity  Skin: no rash or lesions.  GU: no dysuria, change in color of urine, no urgency or frequency.  No flank pain.  MS:  No joint pain or swelling.  No decreased range of motion.  No back pain.  Psych:  No change in mood or affect. No depression or anxiety.  No memory loss.     Objective:   Physical Exam Filed Vitals:   06/23/10 1345  BP: 106/76  Pulse: 78  Temp: 97.6 F (36.4 C)  TempSrc: Oral  Height: 5' 5.5" (1.664 m)  Weight: 146 lb (66.225 kg)  SpO2: 96%    Gen: Pleasant, well-nourished, in no distress,  normal affect  ENT: No lesions,  mouth clear,  oropharynx clear, no postnasal drip  Neck: No JVD, no TMG, no carotid bruits  Lungs: No use of accessory muscles, no dullness to percussion, distant BS  Cardiovascular: RRR, heart sounds normal, no murmur or gallops, no peripheral edema  Abdomen: soft and NT, no HSM,  BS normal  Musculoskeletal: No deformities, no cyanosis or clubbing  Neuro: alert, non focal  Skin: Warm, no lesions or rashes        Assessment & Plan:  COPD Copd moderate  golds III active smoking Plan Smoking cessation counseling given,  Pt declined chantix and nicotine replacement Rx No change in inhaled or maintenance medications. Return in   4 months     Updated Medication List Outpatient Encounter Prescriptions as of 06/23/2010  Medication Sig Dispense Refill  . albuterol (PROAIR HFA) 108 (90 BASE) MCG/ACT inhaler Inhale 2 puffs into the lungs every 6 (six) hours as needed.  1 Inhaler  6  . ALPRAZolam (XANAX) 1 MG tablet 1/2 tablet by mouth 4 times daily and 1 at bedtime       . aspirin 81 MG tablet Take 81 mg by mouth daily.        . bisacodyl (EQ WOMANS LAXATIVE) 5 MG EC tablet Take 5 mg by mouth daily as needed.        . Calcium Carbonate-Vitamin D (CALTRATE 600+D) 600-400 MG-UNIT per tablet Take 1 tablet by mouth 2 (two) times daily.        . Fluticasone-Salmeterol (ADVAIR DISKUS) 250-50 MCG/DOSE AEPB Inhale 1 puff into the lungs every 12 (twelve) hours.  60 each  6  . montelukast (SINGULAIR) 10 MG tablet Take 10 mg by mouth at bedtime.        . Multiple Vitamins-Minerals (CENTRUM SILVER PO) Take 1 tablet by mouth daily.        . Omega-3 Fatty Acids (FISH OIL) 1000 MG CAPS Take 1 capsule by mouth daily.        Marland Kitchen omeprazole (PRILOSEC) 40 MG capsule Take 40 mg by mouth daily.        Marland Kitchen PARoxetine (PAXIL) 20 MG tablet Take 20 mg by mouth every morning.        . polyethylene glycol powder (MIRALAX) powder Take 17 g by mouth daily.        . pravastatin (PRAVACHOL) 80 MG tablet Take 80 mg by mouth daily.        Marland Kitchen tiotropium (SPIRIVA) 18 MCG inhalation capsule Place 18 mcg into inhaler and inhale daily.        . traMADol (ULTRAM) 50 MG tablet Take 50 mg by mouth every 6 (six) hours as needed.        Marland Kitchen DISCONTD: albuterol (PROAIR HFA) 108 (90 BASE) MCG/ACT inhaler Inhale 2 puffs into the lungs every 6 (six) hours as needed.        Marland Kitchen DISCONTD: albuterol-ipratropium (COMBIVENT) 18-103 MCG/ACT inhaler Inhale 2 puffs into the lungs every 6 (six) hours as needed.         Marland Kitchen DISCONTD: Fluticasone-Salmeterol (ADVAIR DISKUS) 250-50 MCG/DOSE AEPB Inhale 1 puff into the lungs every 12 (twelve) hours.        Marland Kitchen DISCONTD: risedronate (ACTONEL) 150 MG tablet Take 150 mg by mouth every 30 (thirty) days. with water on empty stomach, nothing by mouth or lie down for next 30 minutes.

## 2010-06-23 NOTE — Patient Instructions (Addendum)
No change in medications.  Refills on Proair and Advair sent to pharmacy Return in         5 months

## 2010-06-24 NOTE — Telephone Encounter (Signed)
No medication attached to this refill request.   

## 2010-06-24 NOTE — Assessment & Plan Note (Signed)
Copd moderate golds III active smoking Plan Smoking cessation counseling given,  Pt declined chantix and nicotine replacement Rx No change in inhaled or maintenance medications. Return in   4 months

## 2010-09-10 ENCOUNTER — Other Ambulatory Visit: Payer: Self-pay | Admitting: Critical Care Medicine

## 2010-12-01 ENCOUNTER — Ambulatory Visit (INDEPENDENT_AMBULATORY_CARE_PROVIDER_SITE_OTHER): Payer: Medicare Other | Admitting: Critical Care Medicine

## 2010-12-01 ENCOUNTER — Encounter: Payer: Self-pay | Admitting: *Deleted

## 2010-12-01 ENCOUNTER — Encounter: Payer: Self-pay | Admitting: Critical Care Medicine

## 2010-12-01 ENCOUNTER — Telehealth: Payer: Self-pay | Admitting: Critical Care Medicine

## 2010-12-01 VITALS — BP 108/80 | HR 103 | Temp 97.6°F | Ht 65.5 in | Wt 146.2 lb

## 2010-12-01 DIAGNOSIS — J449 Chronic obstructive pulmonary disease, unspecified: Secondary | ICD-10-CM

## 2010-12-01 DIAGNOSIS — J209 Acute bronchitis, unspecified: Secondary | ICD-10-CM

## 2010-12-01 MED ORDER — AZITHROMYCIN 250 MG PO TABS: 250.0000 mg | ORAL_TABLET | Freq: Every day | ORAL | Status: AC

## 2010-12-01 NOTE — Assessment & Plan Note (Signed)
Moderate Copd with ongoing tobacco use Plan Azithromycin x 5days No other changes Tobacco cessation advised

## 2010-12-01 NOTE — Progress Notes (Signed)
Subjective:    Patient ID: Ashlee [name], female    DOB: 02/10/1935, 75 y.o.   MRN: [id number]  HPI  Pulmonary: 75 y.o.  WF with COPD and primary emphysema--current smoker    6/12 No problems since 11/11 ov. No real cough.  Dyspnea is at baseline.  Smokes 1-2 cigs a day. Pt denies any significant sore throat, nasal congestion or excess secretions, fever, chills, sweats, unintended weight loss, pleurtic or exertional chest pain, orthopnea PND, or leg swelling Pt denies any increase in rescue therapy over baseline, denies waking up needing it or having any early am or nocturnal exacerbations of coughing/wheezing/or dyspnea. Pt also denies any obvious fluctuation in symptoms with  weather or environmental change or other alleviating or aggravating factors   12/01/2010  Notes yellow mucus and more dyspneic.  Worse since started coughing more.   Now down 4cigs/month.   No chest pain.  Pt denies any significant sore throat, nasal congestion or excess secretions, fever, chills, sweats, unintended weight loss, pleurtic or exertional chest pain, orthopnea PND, or leg swelling Pt denies any increase in rescue therapy over baseline, denies waking up needing it or having any early am or nocturnal exacerbations of coughing/wheezing/or dyspnea. Pt also denies any obvious fluctuation in symptoms with  weather or environmental change or other alleviating or aggravating factors   Past Medical History  Diagnosis Date  . HTN (hypertension)   . Asthma   . Dyslipidemia   . Carotid stenosis   . Nonspecific elevation of levels of transaminase or lactic acid dehydrogenase (LDH)   . Contact dermatitis   . Polyneuropathy in other diseases classified elsewhere   . Folate-deficiency anemia   . Lump or mass in breast   . Acid reflux disease   . Colonic polyp   . Fracture of right pubis   . Osteoporosis   . Vocal cord leukoplakia   . Seizure disorder   . Panic attack   . COPD (chronic obstructive  pulmonary disease)     fev1  64% DLCO 42% 2005  . Renal insufficiency     creat up with ACE to 1.7 and k+ 5.5..ACE d/c 07/2009     Family History  Problem Relation Age of Onset  . Other Mother     hemorrhage  . Thyroid disease Daughter   . Heart attack Son      History   Social History  . Marital Status: Widowed    Spouse Name: N/A    Number of Children: 2  . Years of Education: N/A   Occupational History  . previous bartender    Social History Main Topics  . Smoking status: Current Everyday Smoker    Types: Cigarettes  . Smokeless tobacco: Never Used   Comment: currently smoking 1 cigg per week  . Alcohol Use: No  . Drug Use: No  . Sexually Active: Not on file   Other Topics Concern  . Not on file   Social History Narrative  . No narrative on file     Allergies  Allergen Reactions  . Alendronate Sodium     REACTION: indigestion  . Donepezil Hydrochloride     REACTION: SOB, nausea  . Lisinopril     REACTION: elevated creatinine     Outpatient Prescriptions Prior to Visit  Medication Sig Dispense Refill  . albuterol (PROAIR HFA) 108 (90 BASE) MCG/ACT inhaler Inhale 2 puffs into the lungs every 6 (six) hours as needed.  1 Inhaler  6  . ALPRAZolam (  XANAX) 1 MG tablet 1/2 tablet by mouth 4 times daily and 1 at bedtime       . aspirin 81 MG tablet Take 81 mg by mouth daily.        . bisacodyl (EQ WOMANS LAXATIVE) 5 MG EC tablet Take 5 mg by mouth daily as needed.        . Calcium Carbonate-Vitamin D (CALTRATE 600+D) 600-400 MG-UNIT per tablet Take 1 tablet by mouth 2 (two) times daily.        . Fluticasone-Salmeterol (ADVAIR DISKUS) 250-50 MCG/DOSE AEPB Inhale 1 puff into the lungs every 12 (twelve) hours.  60 each  6  . Multiple Vitamins-Minerals (CENTRUM SILVER PO) Take 1 tablet by mouth daily.        . Omega-3 Fatty Acids (FISH OIL) 1000 MG CAPS Take 1 capsule by mouth daily.        Marland Kitchen omeprazole (PRILOSEC) 40 MG capsule Take 40 mg by mouth daily.        Marland Kitchen  PARoxetine (PAXIL) 20 MG tablet Take 20 mg by mouth every morning.        . polyethylene glycol powder (MIRALAX) powder Take 17 g by mouth daily.        . pravastatin (PRAVACHOL) 80 MG tablet Take 80 mg by mouth daily.        Marland Kitchen SPIRIVA HANDIHALER 18 MCG inhalation capsule INHALE THE CONTENTS OF 1 CAPSULE EVERY DAY  30 each  5  . traMADol (ULTRAM) 50 MG tablet Take 50 mg by mouth every 6 (six) hours as needed.        . montelukast (SINGULAIR) 10 MG tablet Take 10 mg by mouth at bedtime.           Review of Systems  Constitutional:   No  weight loss, night sweats,  Fevers, chills, fatigue, lassitude. HEENT:   No headaches,  Difficulty swallowing,  Tooth/dental problems,  Sore throat,                No sneezing, itching, ear ache, nasal congestion, post nasal drip,   CV:  No chest pain,  Orthopnea, PND, swelling in lower extremities, anasarca, dizziness, palpitations  GI  No heartburn, indigestion, abdominal pain, nausea, vomiting, diarrhea, change in bowel habits, loss of appetite  Resp: Notes shortness of breath with exertion not at rest.  No excess mucus, no productive cough,  Notes  non-productive cough,  No coughing up of blood.  No change in color of mucus.  No wheezing.  No chest wall deformity  Skin: no rash or lesions.  GU: no dysuria, change in color of urine, no urgency or frequency.  No flank pain.  MS:  No joint pain or swelling.  No decreased range of motion.  No back pain.  Psych:  No change in mood or affect. No depression or anxiety.  No memory loss.     Objective:   Physical Exam  Filed Vitals:   12/01/10 1342  BP: 108/80  Pulse: 103  Temp: 97.6 F (36.4 C)  TempSrc: Oral  Height: 5' 5.5" (1.664 m)  Weight: 146 lb 3.2 oz (66.316 kg)  SpO2: 94%    Gen: Pleasant, well-nourished, in no distress,  normal affect  ENT: No lesions,  mouth clear,  oropharynx clear, no postnasal drip  Neck: No JVD, no TMG, no carotid bruits  Lungs: No use of accessory muscles,  no dullness to percussion, distant BS  Cardiovascular: RRR, heart sounds normal, no murmur or gallops, no  peripheral edema  Abdomen: soft and NT, no HSM,  BS normal  Musculoskeletal: No deformities, no cyanosis or clubbing  Neuro: alert, non focal  Skin: Warm, no lesions or rashes        Assessment & Plan:   COPD Moderate Copd with ongoing tobacco use Plan Azithromycin x 5days No other changes Tobacco cessation advised     Updated Medication List Outpatient Encounter Prescriptions as of 12/01/2010  Medication Sig Dispense Refill  . albuterol (PROAIR HFA) 108 (90 BASE) MCG/ACT inhaler Inhale 2 puffs into the lungs every 6 (six) hours as needed.  1 Inhaler  6  . ALPRAZolam (XANAX) 1 MG tablet 1/2 tablet by mouth 4 times daily and 1 at bedtime       . aspirin 81 MG tablet Take 81 mg by mouth daily.        . bisacodyl (EQ WOMANS LAXATIVE) 5 MG EC tablet Take 5 mg by mouth daily as needed.        . Calcium Carbonate-Vitamin D (CALTRATE 600+D) 600-400 MG-UNIT per tablet Take 1 tablet by mouth 2 (two) times daily.        . Fluticasone-Salmeterol (ADVAIR DISKUS) 250-50 MCG/DOSE AEPB Inhale 1 puff into the lungs every 12 (twelve) hours.  60 each  6  . Multiple Vitamins-Minerals (CENTRUM SILVER PO) Take 1 tablet by mouth daily.        . Omega-3 Fatty Acids (FISH OIL) 1000 MG CAPS Take 1 capsule by mouth daily.        Marland Kitchen omeprazole (PRILOSEC) 40 MG capsule Take 40 mg by mouth daily.        Marland Kitchen PARoxetine (PAXIL) 20 MG tablet Take 20 mg by mouth every morning.        . polyethylene glycol powder (MIRALAX) powder Take 17 g by mouth daily.        . pravastatin (PRAVACHOL) 80 MG tablet Take 80 mg by mouth daily.        Marland Kitchen SPIRIVA HANDIHALER 18 MCG inhalation capsule INHALE THE CONTENTS OF 1 CAPSULE EVERY DAY  30 each  5  . traMADol (ULTRAM) 50 MG tablet Take 50 mg by mouth every 6 (six) hours as needed.        Marland Kitchen azithromycin (ZITHROMAX) 250 MG tablet Take 1 tablet (250 mg total) by mouth  daily. Take two once then one daily until gone  6 each  0  . DISCONTD: montelukast (SINGULAIR) 10 MG tablet Take 10 mg by mouth at bedtime.

## 2010-12-01 NOTE — Telephone Encounter (Signed)
Letter created and faxed to the number requested. Spoke with pt and notified that this was done.

## 2010-12-01 NOTE — Patient Instructions (Signed)
Azithromycin 250mg  Two once then one daily until gone, sent to pharmacy Stay on inhalers as prescribed Return 6 months

## 2011-02-01 ENCOUNTER — Other Ambulatory Visit: Payer: Self-pay | Admitting: Critical Care Medicine

## 2011-02-23 ENCOUNTER — Telehealth: Payer: Self-pay | Admitting: Critical Care Medicine

## 2011-02-23 MED ORDER — TIOTROPIUM BROMIDE MONOHYDRATE 18 MCG IN CAPS: 18.0000 ug | ORAL_CAPSULE | Freq: Every day | RESPIRATORY_TRACT | Status: DC

## 2011-02-23 NOTE — Telephone Encounter (Signed)
Called and spoke with pt. Pt aware rx sent to pharmacy.   

## 2011-02-23 NOTE — Telephone Encounter (Signed)
Attempted to contact, line busy x 3

## 2011-02-23 NOTE — Telephone Encounter (Signed)
Pt returned call. She says that she does NOT need refill of advair but does, however, need a refill of spiriva. [facility] [address] [place]

## 2011-03-13 ENCOUNTER — Other Ambulatory Visit: Payer: Self-pay | Admitting: Critical Care Medicine

## 2011-03-15 NOTE — Telephone Encounter (Signed)
Spiriva Rx sent on 02/23/11 #30 x 5.  Nettie Elm with [facility] verified this rx was received.

## 2011-06-08 ENCOUNTER — Encounter: Payer: Self-pay | Admitting: Critical Care Medicine

## 2011-06-08 ENCOUNTER — Encounter: Payer: Self-pay | Admitting: Internal Medicine

## 2011-06-08 ENCOUNTER — Ambulatory Visit (INDEPENDENT_AMBULATORY_CARE_PROVIDER_SITE_OTHER): Payer: Medicare Other | Admitting: Critical Care Medicine

## 2011-06-08 VITALS — BP 128/78 | HR 97 | Temp 97.7°F | Ht 65.5 in | Wt 149.6 lb

## 2011-06-08 DIAGNOSIS — J449 Chronic obstructive pulmonary disease, unspecified: Secondary | ICD-10-CM

## 2011-06-08 MED ORDER — FLUTICASONE-SALMETEROL 250-50 MCG/DOSE IN AEPB: 1.0000 | INHALATION_SPRAY | Freq: Two times a day (BID) | RESPIRATORY_TRACT | Status: DC

## 2011-06-08 MED ORDER — TIOTROPIUM BROMIDE MONOHYDRATE 18 MCG IN CAPS: 18.0000 ug | ORAL_CAPSULE | Freq: Every day | RESPIRATORY_TRACT | Status: DC

## 2011-06-08 NOTE — Patient Instructions (Signed)
No change in medications. Return in        6 months Focus on smoking cessation, consider using nicoderm Minis   As needed

## 2011-06-08 NOTE — Progress Notes (Signed)
Subjective:    Patient ID: Ashlee [name], female    DOB: 1935/03/20, 76 y.o.   MRN: [id number]  HPI  Pulmonary: 76 y.o.  WF with COPD and primary emphysema--current smoker    6/12 No problems since 11/11 ov. No real cough.  Dyspnea is at baseline.  Smokes 1-2 cigs a day. Pt denies any significant sore throat, nasal congestion or excess secretions, fever, chills, sweats, unintended weight loss, pleurtic or exertional chest pain, orthopnea PND, or leg swelling Pt denies any increase in rescue therapy over baseline, denies waking up needing it or having any early am or nocturnal exacerbations of coughing/wheezing/or dyspnea. Pt also denies any obvious fluctuation in symptoms with  weather or environmental change or other alleviating or aggravating factors   11/20 Notes yellow mucus and more dyspneic.  Worse since started coughing more.   Now down 4cigs/month.   No chest pain.  Pt denies any significant sore throat, nasal congestion or excess secretions, fever, chills, sweats, unintended weight loss, pleurtic or exertional chest pain, orthopnea PND, or leg swelling Pt denies any increase in rescue therapy over baseline, denies waking up needing it or having any early am or nocturnal exacerbations of coughing/wheezing/or dyspnea. Pt also denies any obvious fluctuation in symptoms with  weather or environmental change or other alleviating or aggravating factors  06/08/2011 Has a continued cough and has wheezing on occasion.  Now on 2 per week of cigarettes. No chest pain.  No f/c/s. Pt denies any significant sore throat, nasal congestion or excess secretions, fever, chills, sweats, unintended weight loss, pleurtic or exertional chest pain, orthopnea PND, or leg swelling Pt denies any increase in rescue therapy over baseline, denies waking up needing it or having any early am or nocturnal exacerbations of coughing/wheezing/or dyspnea. Pt also denies any obvious fluctuation in symptoms with   weather or environmental change or other alleviating or aggravating factors    Past Medical History  Diagnosis Date  . HTN (hypertension)   . Asthma   . Dyslipidemia   . Carotid stenosis   . Nonspecific elevation of levels of transaminase or lactic acid dehydrogenase (LDH)   . Contact dermatitis   . Polyneuropathy in other diseases classified elsewhere   . Folate-deficiency anemia   . Lump or mass in breast   . Acid reflux disease   . Colonic polyp   . Fracture of right pubis   . Osteoporosis   . Vocal cord leukoplakia   . Seizure disorder   . Panic attack   . COPD (chronic obstructive pulmonary disease)     fev1  64% DLCO 42% 2005  . Renal insufficiency     creat up with ACE to 1.7 and k+ 5.5..ACE d/c 07/2009     Family History  Problem Relation Age of Onset  . Other Mother     hemorrhage  . Thyroid disease Daughter   . Heart attack Son      History   Social History  . Marital Status: Widowed    Spouse Name: N/A    Number of Children: 2  . Years of Education: N/A   Occupational History  . previous bartender    Social History Main Topics  . Smoking status: Current Some Day Smoker -- 0.1 packs/day    Types: Cigarettes  . Smokeless tobacco: Never Used   Comment: currently smoking approx twice per week - 2 cig/wk  . Alcohol Use: No  . Drug Use: No  . Sexually Active: Not on file  Other Topics Concern  . Not on file   Social History Narrative  . No narrative on file     Allergies  Allergen Reactions  . Alendronate Sodium     REACTION: indigestion  . Donepezil Hydrochloride     REACTION: SOB, nausea  . Lisinopril     REACTION: elevated creatinine     Outpatient Prescriptions Prior to Visit  Medication Sig Dispense Refill  . albuterol (PROAIR HFA) 108 (90 BASE) MCG/ACT inhaler Inhale 2 puffs into the lungs every 6 (six) hours as needed.  1 Inhaler  6  . ALPRAZolam (XANAX) 1 MG tablet 1/2 tablet by mouth 4 times daily and 1 at bedtime       .  aspirin 81 MG tablet Take 81 mg by mouth daily.        . bisacodyl (EQ WOMANS LAXATIVE) 5 MG EC tablet Take 5 mg by mouth daily as needed.        . Calcium Carbonate-Vitamin D (CALTRATE 600+D) 600-400 MG-UNIT per tablet Take 1 tablet by mouth 2 (two) times daily.        . Multiple Vitamins-Minerals (CENTRUM SILVER PO) Take 1 tablet by mouth daily.        . Omega-3 Fatty Acids (FISH OIL) 1000 MG CAPS Take 1 capsule by mouth daily.        Marland Kitchen omeprazole (PRILOSEC) 40 MG capsule Take 40 mg by mouth daily.        Marland Kitchen PARoxetine (PAXIL) 20 MG tablet Take 20 mg by mouth every morning.        . polyethylene glycol powder (MIRALAX) powder Take 17 g by mouth daily.        . pravastatin (PRAVACHOL) 80 MG tablet Take 80 mg by mouth daily.        . traMADol (ULTRAM) 50 MG tablet Take 50 mg by mouth every 6 (six) hours as needed.        Marland Kitchen ADVAIR DISKUS 250-50 MCG/DOSE AEPB INHALE 1 PUFF INTO THE LUNGS EVERY 12 (TWELVE) HOURS.  60 each  4  . tiotropium (SPIRIVA HANDIHALER) 18 MCG inhalation capsule Place 1 capsule (18 mcg total) into inhaler and inhale daily.  30 capsule  5     Review of Systems  Constitutional:   No  weight loss, night sweats,  Fevers, chills, fatigue, lassitude. HEENT:   No headaches,  Difficulty swallowing,  Tooth/dental problems,  Sore throat,                No sneezing, itching, ear ache, nasal congestion, post nasal drip,   CV:  No chest pain,  Orthopnea, PND, swelling in lower extremities, anasarca, dizziness, palpitations  GI  No heartburn, indigestion, abdominal pain, nausea, vomiting, diarrhea, change in bowel habits, loss of appetite  Resp: Notes shortness of breath with exertion not at rest.  No excess mucus, no productive cough,  Notes  non-productive cough,  No coughing up of blood.  No change in color of mucus.  No wheezing.  No chest wall deformity  Skin: no rash or lesions.  GU: no dysuria, change in color of urine, no urgency or frequency.  No flank pain.  MS:  No  joint pain or swelling.  No decreased range of motion.  No back pain.  Psych:  No change in mood or affect. No depression or anxiety.  No memory loss.     Objective:   Physical Exam  Filed Vitals:   06/08/11 1102  BP: 128/78  Pulse:  97  Temp: 97.7 F (36.5 C)  TempSrc: Oral  Height: 5' 5.5" (1.664 m)  Weight: 67.858 kg (149 lb 9.6 oz)  SpO2: 95%    Gen: Pleasant, well-nourished, in no distress,  normal affect  ENT: No lesions,  mouth clear,  oropharynx clear, no postnasal drip  Neck: No JVD, no TMG, no carotid bruits  Lungs: No use of accessory muscles, no dullness to percussion, distant BS  Cardiovascular: RRR, heart sounds normal, no murmur or gallops, no peripheral edema  Abdomen: soft and NT, no HSM,  BS normal  Musculoskeletal: No deformities, no cyanosis or clubbing  Neuro: alert, non focal  Skin: Warm, no lesions or rashes      06/08/2011  Cleda Daub:  FeV1 29%  FeV1/FVC 61%  Fef 25 75  14%  No desat on ambulation RA.    Assessment & Plan:   COPD Golds C Copd on oxygen qhs Stable at present Plan No change in inhaled or maintenance medications. Return in  6 months Work on smoking cessation     Updated Medication List Outpatient Encounter Prescriptions as of 06/08/2011  Medication Sig Dispense Refill  . albuterol (PROAIR HFA) 108 (90 BASE) MCG/ACT inhaler Inhale 2 puffs into the lungs every 6 (six) hours as needed.  1 Inhaler  6  . ALPRAZolam (XANAX) 1 MG tablet 1/2 tablet by mouth 4 times daily and 1 at bedtime       . aspirin 81 MG tablet Take 81 mg by mouth daily.        . bisacodyl (EQ WOMANS LAXATIVE) 5 MG EC tablet Take 5 mg by mouth daily as needed.        . Calcium Carbonate-Vitamin D (CALTRATE 600+D) 600-400 MG-UNIT per tablet Take 1 tablet by mouth 2 (two) times daily.        . Fluticasone-Salmeterol (ADVAIR DISKUS) 250-50 MCG/DOSE AEPB Inhale 1 puff into the lungs 2 (two) times daily.  60 each  6  . Multiple Vitamins-Minerals (CENTRUM  SILVER PO) Take 1 tablet by mouth daily.        . Omega-3 Fatty Acids (FISH OIL) 1000 MG CAPS Take 1 capsule by mouth daily.        Marland Kitchen omeprazole (PRILOSEC) 40 MG capsule Take 40 mg by mouth daily.        Marland Kitchen PARoxetine (PAXIL) 20 MG tablet Take 20 mg by mouth every morning.        . polyethylene glycol powder (MIRALAX) powder Take 17 g by mouth daily.        . pravastatin (PRAVACHOL) 80 MG tablet Take 80 mg by mouth daily.        Marland Kitchen tiotropium (SPIRIVA HANDIHALER) 18 MCG inhalation capsule Place 1 capsule (18 mcg total) into inhaler and inhale daily.  30 capsule  6  . traMADol (ULTRAM) 50 MG tablet Take 50 mg by mouth every 6 (six) hours as needed.        Marland Kitchen DISCONTD: ADVAIR DISKUS 250-50 MCG/DOSE AEPB INHALE 1 PUFF INTO THE LUNGS EVERY 12 (TWELVE) HOURS.  60 each  4  . DISCONTD: tiotropium (SPIRIVA HANDIHALER) 18 MCG inhalation capsule Place 1 capsule (18 mcg total) into inhaler and inhale daily.  30 capsule  5

## 2011-06-08 NOTE — Assessment & Plan Note (Signed)
Golds C Copd on oxygen qhs Stable at present Plan No change in inhaled or maintenance medications. Return in  6 months Work on smoking cessation

## 2011-06-14 ENCOUNTER — Ambulatory Visit (AMBULATORY_SURGERY_CENTER): Payer: Medicare Other | Admitting: *Deleted

## 2011-06-14 VITALS — Ht 65.5 in | Wt 145.0 lb

## 2011-06-14 DIAGNOSIS — Z1211 Encounter for screening for malignant neoplasm of colon: Secondary | ICD-10-CM

## 2011-06-14 DIAGNOSIS — Z86010 Personal history of colonic polyps: Secondary | ICD-10-CM

## 2011-06-14 MED ORDER — PEG-KCL-NACL-NASULF-NA ASC-C 100 G PO SOLR: ORAL | Status: DC

## 2011-06-25 ENCOUNTER — Ambulatory Visit (AMBULATORY_SURGERY_CENTER): Payer: Medicare Other | Admitting: Internal Medicine

## 2011-06-25 ENCOUNTER — Encounter: Payer: Self-pay | Admitting: Internal Medicine

## 2011-06-25 VITALS — BP 139/73 | HR 91 | Temp 97.4°F | Resp 33 | Ht 65.0 in | Wt 149.0 lb

## 2011-06-25 DIAGNOSIS — D126 Benign neoplasm of colon, unspecified: Secondary | ICD-10-CM

## 2011-06-25 DIAGNOSIS — Z860100 Personal history of colon polyps, unspecified: Secondary | ICD-10-CM | POA: Insufficient documentation

## 2011-06-25 DIAGNOSIS — Z1211 Encounter for screening for malignant neoplasm of colon: Secondary | ICD-10-CM

## 2011-06-25 MED ORDER — SODIUM CHLORIDE 0.9 % IV SOLN: 500.0000 mL | INTRAVENOUS | Status: DC

## 2011-06-25 NOTE — Patient Instructions (Signed)
YOU HAD AN ENDOSCOPIC PROCEDURE TODAY AT THE [facility]: Refer to the procedure report that was given to you for any specific questions about what was found during the examination.  If the procedure report does not answer your questions, please call your gastroenterologist to clarify.  If you requested that your care partner not be given the details of your procedure findings, then the procedure report has been included in a sealed envelope for you to review at your convenience later.  YOU SHOULD EXPECT: Some feelings of bloating in the abdomen. Passage of more gas than usual.  Walking can help get rid of the air that was put into your GI tract during the procedure and reduce the bloating. If you had a lower endoscopy (such as a colonoscopy or flexible sigmoidoscopy) you may notice spotting of blood in your stool or on the toilet paper. If you underwent a bowel prep for your procedure, then you may not have a normal bowel movement for a few days.  DIET: Your first meal following the procedure should be a light meal and then it is ok to progress to your normal diet.  A half-sandwich or bowl of soup is an example of a good first meal.  Heavy or fried foods are harder to digest and may make you feel nauseous or bloated.  Likewise meals heavy in dairy and vegetables can cause extra gas to form and this can also increase the bloating.  Drink plenty of fluids but you should avoid alcoholic beverages for 24 hours.  ACTIVITY: Your care partner should take you home directly after the procedure.  You should plan to take it easy, moving slowly for the rest of the day.  You can resume normal activity the day after the procedure however you should NOT DRIVE or use heavy machinery for 24 hours (because of the sedation medicines used during the test).    SYMPTOMS TO REPORT IMMEDIATELY: A gastroenterologist can be reached at any hour.  During normal business hours, 8:30 AM to 5:00 PM Monday through Friday,  call [phone].  After hours and on weekends, please call the GI answering service at [phone] who will take a message and have the physician on call contact you.   Following lower endoscopy (colonoscopy or flexible sigmoidoscopy):  Excessive amounts of blood in the stool  Significant tenderness or worsening of abdominal pains  Swelling of the abdomen that is new, acute  Fever of 100F or higher  Black, tarry-looking stools  FOLLOW UP: If any biopsies were taken you will be contacted by phone or by letter within the next 1-3 weeks.  Call your gastroenterologist if you have not heard about the biopsies in 3 weeks.  Our staff will call the home number listed on your records the next business day following your procedure to check on you and address any questions or concerns that you may have at that time regarding the information given to you following your procedure. This is a courtesy call and so if there is no answer at the home number and we have not heard from you through the emergency physician on call, we will assume that you have returned to your regular daily activities without incident.  SIGNATURES/CONFIDENTIALITY: You and/or your care partner have signed paperwork which will be entered into your electronic medical record.  These signatures attest to the fact that that the information above on your After Visit Summary has been reviewed and is understood.  Full responsibility of   the confidentiality of this discharge information lies with you and/or your care-partner.  

## 2011-06-25 NOTE — Progress Notes (Signed)
The pt tolerated the colonoscopy very well. Maw   

## 2011-06-25 NOTE — Op Note (Signed)
 [facility] [address] [place], [place]  [address]  COLONOSCOPY PROCEDURE REPORT  PATIENT:  Ashlee, [name]  MR#:  [id number] BIRTHDATE:  1935/10/13, 75 yrs. old  GENDER:  female ENDOSCOPIST:  Iva Boop, MD, [facility]  PROCEDURE DATE:  06/25/2011 PROCEDURE:  Colonoscopy with snare polypectomy ASA CLASS:  Class III INDICATIONS:  surveillance and high-risk screening, history of pre-cancerous (adenomatous) colon polyps 2 diminutive adenomas in 2007 MEDICATIONS:   These medications were titrated to patient response per physician's verbal order, MAC sedation, administered by CRNA, propofol (Diprivan) 200 mg IV  DESCRIPTION OF PROCEDURE:   After the risks benefits and alternatives of the procedure were thoroughly explained, informed consent was obtained.  Digital rectal exam was performed and revealed no abnormalities.   The LB [id number] endoscope was introduced through the anus and advanced to the cecum, which was identified by both the appendix and ileocecal valve, without limitations.  The quality of the prep was good, using MoviPrep. The instrument was then slowly withdrawn as the colon was fully examined. <<PROCEDUREIMAGES>>  FINDINGS:  There were multiple polyps identified and removed. Eleven polyps removed. Largest was 1 cm in descending, hot snare. Others were cecum (7mm), transverse (2), descending (2) and sigmoid (5). Three diminutive sigmoid polyps were not recovered. This was otherwise a normal examination of the colon. Colon was redundant.   Retroflexed views in the rectum revealed no abnormalities.    The time to cecum = 10:50 minutes. The scope was then withdrawn in 22:11 minutes from the cecum and the procedure completed. COMPLICATIONS:  None ENDOSCOPIC IMPRESSION: 1) Polyps, 11 removed, largest 1 cm 2) Otherwise normal examination, good prep 3) Personal hx adenomas 2007 RECOMMENDATIONS: 1) Hold aspirin, aspirin products, and  anti-inflammatory medication for 2 weeks. 2) Take Miralax every or most days. REPEAT EXAM:  Routine repeat exam will depend upon health status - would see in office before scheduling a colonoscopy in future. Will notify after pathology review.  Iva Boop, MD, [facility]  CC:  Yisroel Ramming, MD and The Patient  n. eSIGNED:   Iva Boop at 06/25/2011 04:18 PM  Sheralyn Boatman, [phone]

## 2011-06-25 NOTE — Progress Notes (Signed)
Patient did not experience any of the following events: a burn prior to discharge; a fall within the facility; wrong site/side/patient/procedure/implant event; or a hospital transfer or hospital admission upon discharge from the facility. (G8907) Patient did not have preoperative order for IV antibiotic SSI prophylaxis. (G8918)  

## 2011-06-28 ENCOUNTER — Telehealth: Payer: Self-pay

## 2011-06-28 NOTE — Telephone Encounter (Signed)
  Follow up Call-  Call back number 06/25/2011  Post procedure Call Back phone  # [phone]  Permission to leave phone message Yes     Patient questions:  Do you have a fever, pain , or abdominal swelling? no Pain Score  0 *  Have you tolerated food without any problems? yes  Have you been able to return to your normal activities? yes  Do you have any questions about your discharge instructions: Diet   no Medications  no Follow up visit  no  Do you have questions or concerns about your Care? no  Actions: * If pain score is 4 or above: No action needed, pain <4.

## 2011-07-02 ENCOUNTER — Encounter: Payer: Self-pay | Admitting: Internal Medicine

## 2011-07-02 NOTE — Progress Notes (Signed)
Quick Note:  11 polyps, mix of adenoma, serrated polyps and hyperplastic Consider repeat colonoscopy in 1 year 2014 ______

## 2011-09-08 ENCOUNTER — Other Ambulatory Visit: Payer: Self-pay | Admitting: Critical Care Medicine

## 2011-11-24 ENCOUNTER — Ambulatory Visit (INDEPENDENT_AMBULATORY_CARE_PROVIDER_SITE_OTHER): Payer: Medicare Other | Admitting: Critical Care Medicine

## 2011-11-24 ENCOUNTER — Encounter: Payer: Self-pay | Admitting: Critical Care Medicine

## 2011-11-24 VITALS — BP 106/70 | HR 115 | Temp 97.3°F | Ht 65.5 in | Wt 147.4 lb

## 2011-11-24 DIAGNOSIS — J4489 Other specified chronic obstructive pulmonary disease: Secondary | ICD-10-CM

## 2011-11-24 DIAGNOSIS — J449 Chronic obstructive pulmonary disease, unspecified: Secondary | ICD-10-CM

## 2011-11-24 MED ORDER — TIOTROPIUM BROMIDE MONOHYDRATE 18 MCG IN CAPS: 18.0000 ug | ORAL_CAPSULE | Freq: Every day | RESPIRATORY_TRACT | Status: DC

## 2011-11-24 MED ORDER — FLUTICASONE-SALMETEROL 250-50 MCG/DOSE IN AEPB: 1.0000 | INHALATION_SPRAY | Freq: Two times a day (BID) | RESPIRATORY_TRACT | Status: DC

## 2011-11-24 NOTE — Assessment & Plan Note (Signed)
Chronic obstructive lung disease gold stage C. with reduction in smoking Plan Maintain inhaled medications as prescribed Pursue further smoking cessation Return 6 months

## 2011-11-24 NOTE — Patient Instructions (Addendum)
No change in medications. Return in        6 months        
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
## 2012-01-10 ENCOUNTER — Other Ambulatory Visit: Payer: Self-pay | Admitting: Critical Care Medicine

## 2012-06-09 ENCOUNTER — Encounter: Payer: Self-pay | Admitting: Critical Care Medicine

## 2012-06-09 ENCOUNTER — Ambulatory Visit (INDEPENDENT_AMBULATORY_CARE_PROVIDER_SITE_OTHER): Payer: Medicare Other | Admitting: Critical Care Medicine

## 2012-06-09 VITALS — BP 120/70 | HR 93 | Temp 97.0°F | Ht 64.5 in | Wt 136.8 lb

## 2012-06-09 DIAGNOSIS — J4489 Other specified chronic obstructive pulmonary disease: Secondary | ICD-10-CM

## 2012-06-09 DIAGNOSIS — J449 Chronic obstructive pulmonary disease, unspecified: Secondary | ICD-10-CM
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
## 2012-06-09 NOTE — Patient Instructions (Addendum)
No change in medications. Return in         4 months 

## 2012-06-10 NOTE — Assessment & Plan Note (Signed)
Gold C Copd stable despite ongoing tobacco use Plan No change in inhaled or maintenance medications.

## 2012-07-06 ENCOUNTER — Encounter: Payer: Self-pay | Admitting: Internal Medicine

## 2012-08-23 ENCOUNTER — Telehealth: Payer: Self-pay | Admitting: *Deleted

## 2012-08-23 NOTE — Telephone Encounter (Signed)
Dr. Leone Payor, This pt is coming in for her PV on 08-24-12 and her colon is 09-07-12.  While getting her chart ready, I noted on her 06-09-12 OV with pulmonologist that she wears oxygen at night now.  From what I noted, this is new from the last time she was here in June of 2013.   Do you want an OV with her?  Please advise  Baxter Hire

## 2012-08-24 ENCOUNTER — Ambulatory Visit (AMBULATORY_SURGERY_CENTER): Payer: Medicare Other | Admitting: *Deleted

## 2012-08-24 VITALS — Ht 65.0 in | Wt 135.6 lb

## 2012-08-24 DIAGNOSIS — Z86010 Personal history of colonic polyps: Secondary | ICD-10-CM

## 2012-08-24 MED ORDER — NA SULFATE-K SULFATE-MG SULF 17.5-3.13-1.6 GM/177ML PO SOLN: 1.0000 | Freq: Once | ORAL | Status: DC

## 2012-08-25 ENCOUNTER — Telehealth: Payer: Self-pay

## 2012-08-25 ENCOUNTER — Other Ambulatory Visit: Payer: Self-pay

## 2012-08-25 DIAGNOSIS — Z1211 Encounter for screening for malignant neoplasm of colon: Secondary | ICD-10-CM

## 2012-08-25 NOTE — Telephone Encounter (Signed)
Patient notified of appt for [facility] 09/12/12 8:45 we went over her instructions in detail that were provided by Ezra Sites yesterday at her pre-visit.  She will call back for any questions or concerns

## 2012-08-25 NOTE — Telephone Encounter (Signed)
Message copied by Annett Fabian on Fri Aug 25, 2012 10:10 AM ------      Message from: Maple Hudson      Created: Fri Aug 25, 2012  8:41 AM      Regarding: procedure to be scheduled at [facility]:      I saw this pt in PV yesterday.  She has COPD and is on home oxygen at bedtime.  Kristen sent Dr. Leone Payor message about scheduling her at hospital but I do not see where he responded to her.  She spoke with Dr. Leone Payor in [facility] yesterday and he said he would probably do pt at hospital early September.  I completed her PV and gave her Suprep instructions and how to take prep with times left blank.  All she will need is for times to be filled in over the phone when her procedure is scheduled at [facility].  I have cancelled [facility] procedure.  Thanks, Olegario Messier ------

## 2012-08-25 NOTE — Telephone Encounter (Signed)
I see she was set up for hospital as we discussed

## 2012-08-31 ENCOUNTER — Telehealth: Payer: Self-pay | Admitting: Critical Care Medicine

## 2012-08-31 MED ORDER — TIOTROPIUM BROMIDE MONOHYDRATE 18 MCG IN CAPS: ORAL_CAPSULE | RESPIRATORY_TRACT | Status: DC

## 2012-08-31 NOTE — Telephone Encounter (Signed)
LMTCB x1 for pt--RX sent

## 2012-08-31 NOTE — Telephone Encounter (Signed)
Pt returned call.  I informed her rx was sent to her pharm.  Nothing further needed. Ashlee [name]

## 2012-09-01 ENCOUNTER — Other Ambulatory Visit: Payer: Self-pay | Admitting: Critical Care Medicine

## 2012-09-01 ENCOUNTER — Encounter (HOSPITAL_COMMUNITY): Payer: Self-pay | Admitting: Pharmacy Technician

## 2012-09-01 ENCOUNTER — Encounter (HOSPITAL_COMMUNITY): Payer: Self-pay | Admitting: *Deleted

## 2012-09-07 ENCOUNTER — Encounter: Payer: Medicare Other | Admitting: Internal Medicine

## 2012-09-12 ENCOUNTER — Ambulatory Visit (HOSPITAL_COMMUNITY): Payer: Medicare Other | Admitting: Anesthesiology

## 2012-09-12 ENCOUNTER — Encounter (HOSPITAL_COMMUNITY): Payer: Self-pay | Admitting: *Deleted

## 2012-09-12 ENCOUNTER — Encounter (HOSPITAL_COMMUNITY)
Admission: RE | Disposition: A | Discharge status: Home / Self Care | Payer: Self-pay | Source: Ambulatory Visit | Attending: Internal Medicine

## 2012-09-12 ENCOUNTER — Ambulatory Visit (HOSPITAL_COMMUNITY)
Admission: RE | Admit: 2012-09-12 | Discharge: 2012-09-12 | Disposition: A | Discharge status: Home / Self Care | Payer: Medicare Other | Source: Ambulatory Visit | Attending: Internal Medicine | Admitting: Internal Medicine

## 2012-09-12 ENCOUNTER — Encounter (HOSPITAL_COMMUNITY): Payer: Self-pay | Admitting: Anesthesiology

## 2012-09-12 DIAGNOSIS — F410 Panic disorder [episodic paroxysmal anxiety] without agoraphobia: Secondary | ICD-10-CM | POA: Insufficient documentation

## 2012-09-12 DIAGNOSIS — Z7982 Long term (current) use of aspirin: Secondary | ICD-10-CM | POA: Insufficient documentation

## 2012-09-12 DIAGNOSIS — I129 Hypertensive chronic kidney disease with stage 1 through stage 4 chronic kidney disease, or unspecified chronic kidney disease: Secondary | ICD-10-CM | POA: Insufficient documentation

## 2012-09-12 DIAGNOSIS — Z79899 Other long term (current) drug therapy: Secondary | ICD-10-CM | POA: Insufficient documentation

## 2012-09-12 DIAGNOSIS — E785 Hyperlipidemia, unspecified: Secondary | ICD-10-CM | POA: Insufficient documentation

## 2012-09-12 DIAGNOSIS — M810 Age-related osteoporosis without current pathological fracture: Secondary | ICD-10-CM | POA: Insufficient documentation

## 2012-09-12 DIAGNOSIS — G609 Hereditary and idiopathic neuropathy, unspecified: Secondary | ICD-10-CM | POA: Insufficient documentation

## 2012-09-12 DIAGNOSIS — Z860100 Personal history of colon polyps, unspecified: Secondary | ICD-10-CM | POA: Insufficient documentation

## 2012-09-12 DIAGNOSIS — N189 Chronic kidney disease, unspecified: Secondary | ICD-10-CM | POA: Insufficient documentation

## 2012-09-12 DIAGNOSIS — I6529 Occlusion and stenosis of unspecified carotid artery: Secondary | ICD-10-CM | POA: Insufficient documentation

## 2012-09-12 DIAGNOSIS — F17200 Nicotine dependence, unspecified, uncomplicated: Secondary | ICD-10-CM | POA: Insufficient documentation

## 2012-09-12 DIAGNOSIS — I739 Peripheral vascular disease, unspecified: Secondary | ICD-10-CM | POA: Insufficient documentation

## 2012-09-12 DIAGNOSIS — D529 Folate deficiency anemia, unspecified: Secondary | ICD-10-CM | POA: Insufficient documentation

## 2012-09-12 DIAGNOSIS — R7401 Elevation of levels of liver transaminase levels: Secondary | ICD-10-CM | POA: Insufficient documentation

## 2012-09-12 DIAGNOSIS — Z9981 Dependence on supplemental oxygen: Secondary | ICD-10-CM | POA: Insufficient documentation

## 2012-09-12 DIAGNOSIS — Z888 Allergy status to other drugs, medicaments and biological substances status: Secondary | ICD-10-CM | POA: Insufficient documentation

## 2012-09-12 DIAGNOSIS — J449 Chronic obstructive pulmonary disease, unspecified: Secondary | ICD-10-CM | POA: Insufficient documentation

## 2012-09-12 DIAGNOSIS — K219 Gastro-esophageal reflux disease without esophagitis: Secondary | ICD-10-CM | POA: Insufficient documentation

## 2012-09-12 DIAGNOSIS — D126 Benign neoplasm of colon, unspecified: Secondary | ICD-10-CM | POA: Diagnosis present

## 2012-09-12 DIAGNOSIS — R7402 Elevation of levels of lactic acid dehydrogenase (LDH): Secondary | ICD-10-CM | POA: Insufficient documentation

## 2012-09-12 DIAGNOSIS — Z1211 Encounter for screening for malignant neoplasm of colon: Secondary | ICD-10-CM

## 2012-09-12 DIAGNOSIS — J4489 Other specified chronic obstructive pulmonary disease: Secondary | ICD-10-CM | POA: Insufficient documentation

## 2012-09-12 HISTORY — PX: COLONOSCOPY: SHX5424

## 2012-09-12 LAB — CK TOTAL AND CKMB (NOT AT ARMC)
CK, MB: 3.1 ng/mL (ref 0.3–4.0)
Total CK: 103 U/L (ref 7–177)

## 2012-09-12 LAB — COMPREHENSIVE METABOLIC PANEL
BUN: 10 mg/dL (ref 6–23)
CO2: 28 mEq/L (ref 19–32)
Calcium: 10.6 mg/dL — ABNORMAL HIGH (ref 8.4–10.5)
Chloride: 99 mEq/L (ref 96–112)
Creatinine, Ser: 1.08 mg/dL (ref 0.50–1.10)
GFR calc non Af Amer: 49 mL/min — ABNORMAL LOW (ref >90)
Total Bilirubin: 0.5 mg/dL (ref 0.3–1.2)

## 2012-09-12 LAB — CBC
HCT: 45.7 % (ref 36.0–46.0)
MCH: 32.2 pg (ref 26.0–34.0)
MCV: 95.6 fL (ref 78.0–100.0)
RDW: 13.8 % (ref 11.5–15.5)
WBC: 10.6 10*3/uL — ABNORMAL HIGH (ref 4.0–10.5)

## 2012-09-12 SURGERY — COLONOSCOPY
Anesthesia: Monitor Anesthesia Care

## 2012-09-12 MED ORDER — ONDANSETRON HCL 4 MG/2ML IJ SOLN
INTRAMUSCULAR | Status: DC | PRN
  Administered 2012-09-12: 4 mg via INTRAVENOUS

## 2012-09-12 MED ORDER — SODIUM CHLORIDE 0.9 % IV SOLN
INTRAVENOUS | Status: DC
  Administered 2012-09-12: 09:00:00 via INTRAVENOUS

## 2012-09-12 MED ORDER — MIDAZOLAM HCL 5 MG/5ML IJ SOLN
INTRAMUSCULAR | Status: DC | PRN
  Administered 2012-09-12: 1 mg via INTRAVENOUS

## 2012-09-12 MED ORDER — PROPOFOL INFUSION 10 MG/ML OPTIME
INTRAVENOUS | Status: DC | PRN
  Administered 2012-09-12: 120 ug/kg/min via INTRAVENOUS

## 2012-09-12 MED ORDER — LIDOCAINE HCL (CARDIAC) 20 MG/ML IV SOLN
INTRAVENOUS | Status: DC | PRN
  Administered 2012-09-12: 100 mg via INTRAVENOUS

## 2012-09-12 NOTE — Transfer of Care (Signed)
Immediate Anesthesia Transfer of Care Note  Patient: Ashlee [name]  Procedure(s) Performed: Procedure(s) (LRB): COLONOSCOPY (N/A)  Patient Location: PACU  Anesthesia Type: MAC  Level of Consciousness: sedated, patient cooperative and responds to stimulaton  Airway & Oxygen Therapy: Patient Spontanous Breathing and Patient connected to face mask oxgen  Post-op Assessment: Report given to PACU RN and Post -op Vital signs reviewed and stable  Post vital signs: Reviewed and stable  Complications: No apparent anesthesia complications

## 2012-09-12 NOTE — Anesthesia Postprocedure Evaluation (Signed)
Anesthesia Post Note  Patient: Ashlee [name]  Procedure(s) Performed: Procedure(s) (LRB): COLONOSCOPY (N/A)  Anesthesia type: MAC  Patient location: PACU  Post pain: Pain level controlled  Post assessment: Post-op Vital signs reviewed  Last Vitals: BP 154/84  Temp(Src) 36.7 C (Oral)  Resp 23  Ht 5\' 5"  (1.651 m)  Wt 136 lb (61.689 kg)  BMI 22.63 kg/m2  SpO2 93%  Post vital signs: Reviewed  Level of consciousness: awake  Complications: No apparent anesthesia complications

## 2012-09-12 NOTE — Transfer of Care (Deleted)
Immediate Anesthesia Transfer of Care Note  Patient: Ashlee [name]  Procedure(s) Performed: Procedure(s) (LRB): COLONOSCOPY (N/A)  Patient Location: PACU  Anesthesia Type: General  Level of Consciousness: sedated, patient cooperative and responds to stimulaton  Airway & Oxygen Therapy: Patient Spontanous Breathing and Patient connected to face mask oxgen  Post-op Assessment: Report given to PACU RN and Post -op Vital signs reviewed and stable  Post vital signs: Reviewed and stable  Complications: No apparent anesthesia complications
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
## 2012-09-12 NOTE — Anesthesia Preprocedure Evaluation (Addendum)
Anesthesia Evaluation  Patient identified by MRN, date of birth, ID band Patient awake    Reviewed: Allergy & Precautions, H&P , NPO status , Patient's Chart, lab work & pertinent test results  Airway Mallampati: I TM Distance: >3 FB Neck ROM: Full    Dental  (+) Dental Advisory Given and Teeth Intact   Pulmonary asthma , COPD COPD inhaler,          Cardiovascular hypertension, Pt. on medications + Peripheral Vascular Disease Rhythm:Regular Rate:Normal     Neuro/Psych Seizures -, Well Controlled,  PSYCHIATRIC DISORDERS Anxiety  Neuromuscular disease    GI/Hepatic Neg liver ROS, GERD-  Medicated,  Endo/Other  negative endocrine ROS  Renal/GU Renal Insufficiency and CRFRenal disease     Musculoskeletal negative musculoskeletal ROS (+)   Abdominal   Peds  Hematology negative hematology ROS (+)   Anesthesia Other Findings   Reproductive/Obstetrics                        Anesthesia Physical Anesthesia Plan  ASA: III  Anesthesia Plan: MAC   Post-op Pain Management:    Induction: Intravenous  Airway Management Planned:   Additional Equipment:   Intra-op Plan:   Post-operative Plan:   Informed Consent: I have reviewed the patients History and Physical, chart, labs and discussed the procedure including the risks, benefits and alternatives for the proposed anesthesia with the patient or authorized representative who has indicated his/her understanding and acceptance.   Dental advisory given  Plan Discussed with: CRNA  Anesthesia Plan Comments:        Anesthesia Quick Evaluation

## 2012-09-12 NOTE — Op Note (Signed)
[facility] [address] [place], [address]   COLONOSCOPY PROCEDURE REPORT  PATIENT: Ashlee [name], Ashlee [name].  MR#: [id number] BIRTHDATE: December 06, 1935 , 76  yrs. old GENDER: Female ENDOSCOPIST: Iva Boop, MD, [facility] PROCEDURE DATE:  09/12/2012 PROCEDURE:   Colonoscopy with snare polypectomy First Screening Colonoscopy - Avg.  risk and is 50 yrs.  old or older - No.  Prior Negative Screening - Now for repeat screening. N/A  History of Adenoma - Now for follow-up colonoscopy & has been > or = to 3 yrs.  No.  It has been less than 3 yrs since last colonoscopy.  Medical reason.  Polyps Removed Today? Yes. ASA CLASS:   Class III INDICATIONS:Patient's personal history of adenomatous colon polyps.  MEDICATIONS: MAC sedation, administered by CRNA and See Anesthesia Report.  DESCRIPTION OF PROCEDURE:   After the risks benefits and alternatives of the procedure were thoroughly explained, informed consent was obtained.  A digital rectal exam revealed no abnormalities of the rectum.   The     endoscope was introduced through the anus and advanced to the cecum, which was identified by both the appendix and ileocecal valve. No adverse events experienced.   The quality of the prep was excellent using Suprep The instrument was then slowly withdrawn as the colon was fully examined.   COLON FINDINGS: Two sessile polyps measuring 6 and 8 mm in size were found in the ascending colon.  A polypectomy was performed with a cold snare.  The resection was complete and the polyp tissue was completely retrieved.   The colon mucosa was otherwise normal. Retroflexed views revealed no abnormalities. The time to cecum=3 minutes 0 seconds.  Withdrawal time=18 minutes 0 seconds.  The scope was withdrawn and the procedure completed. COMPLICATIONS: There were no complications.  ENDOSCOPIC IMPRESSION: 1.   Two sessile polyps measuring 6 and 8 mm in size were found in the ascending colon; polypectomy  was performed with a cold snare 2.   The colon mucosa was otherwise normal  RECOMMENDATIONS: I will notify re: pathology results.  Given her age and co-morbidities may not be a candidate for future routine colonosscopy (she has hx 2 adenomas 2007 and 11 polyps 2013 which were adenomas and some hyperplastic)   eSigned:  Iva Boop, MD, [facility] 09/12/2012 10:50 AM   cc: Donia Guiles, MD and The Patient

## 2012-09-12 NOTE — Preoperative (Signed)
Beta Blockers   Reason not to administer Beta Blockers:Not Applicable 

## 2012-09-13 ENCOUNTER — Encounter: Payer: Self-pay | Admitting: Internal Medicine

## 2012-09-13 ENCOUNTER — Encounter (HOSPITAL_COMMUNITY): Payer: Self-pay | Admitting: Internal Medicine

## 2012-09-13 NOTE — Progress Notes (Signed)
Quick Note:  2 adenomas Consider repeat colonoscopy in 3 years but would need office visit to assess - (age + co-morbidities may preclude) ______

## 2012-10-26 ENCOUNTER — Encounter: Payer: Self-pay | Admitting: Critical Care Medicine

## 2012-10-26 ENCOUNTER — Telehealth: Payer: Self-pay | Admitting: Critical Care Medicine

## 2012-10-26 ENCOUNTER — Ambulatory Visit (INDEPENDENT_AMBULATORY_CARE_PROVIDER_SITE_OTHER)
Admission: RE | Admit: 2012-10-26 | Discharge: 2012-10-26 | Disposition: A | Discharge status: Home / Self Care | Payer: Medicare Other | Source: Ambulatory Visit | Attending: Critical Care Medicine | Admitting: Critical Care Medicine

## 2012-10-26 ENCOUNTER — Ambulatory Visit (INDEPENDENT_AMBULATORY_CARE_PROVIDER_SITE_OTHER): Payer: Medicare Other | Admitting: Critical Care Medicine

## 2012-10-26 VITALS — BP 130/84 | HR 116 | Temp 97.9°F | Ht 65.5 in | Wt 139.4 lb

## 2012-10-26 DIAGNOSIS — J449 Chronic obstructive pulmonary disease, unspecified: Secondary | ICD-10-CM

## 2012-10-26 DIAGNOSIS — J4489 Other specified chronic obstructive pulmonary disease: Secondary | ICD-10-CM

## 2012-10-26 MED ORDER — TIOTROPIUM BROMIDE MONOHYDRATE 18 MCG IN CAPS: 18.0000 ug | ORAL_CAPSULE | Freq: Every day | RESPIRATORY_TRACT | Status: DC

## 2012-10-26 NOTE — Progress Notes (Signed)
Quick Note:  Notify the patient that the Xray is stable and no pneumonia No change in medications are recommended. Continue current meds as prescribed at last office visit ______ 

## 2012-10-26 NOTE — Patient Instructions (Signed)
Use nicorette minis 4mg  or nicotine patch step 2 Stay on spiriva Chest xray today Return 4 months
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
## 2012-10-26 NOTE — Telephone Encounter (Signed)
Per Cy-give Prednisone 10 mg #20 take 4 x 2 days, 3 x 2 days, 2 x 2 days, 1 x 2 days, then stop no refills.

## 2012-10-26 NOTE — Telephone Encounter (Signed)
Called and spoke with pt and she stated that she has been having nasal congestion that is yellow since 09/12/2012.  Pt stated that she has a cough and it hurts when she coughs.  Her eyes hurt and its worse since she lays down.  Pt denies any fever or any other complaints.  PW please advise if we can call something in for her.  Thanks  Allergies  Allergen Reactions  . Alendronate Sodium     REACTION: indigestion  . Donepezil Hydrochloride     REACTION: SOB, nausea  . Lisinopril     REACTION: elevated creatinine

## 2012-10-26 NOTE — Telephone Encounter (Signed)
ATC pt line busy x4. No alternate # listed wcb

## 2012-10-26 NOTE — Progress Notes (Signed)
Quick Note:  Called, spoke with pt. Informed her of cxr results and recs. She verbalized understanding and voiced no further questions or concerns at this time. ______

## 2012-10-27 MED ORDER — PREDNISONE 10 MG PO TABS: ORAL_TABLET | ORAL | Status: DC

## 2012-10-27 NOTE — Telephone Encounter (Signed)
rx sent and pt is aware. Eleana Tocco, CMA  

## 2012-10-27 NOTE — Assessment & Plan Note (Signed)
Gold stage C. COPD with ongoing smoking use Plan Smoking cessation counseling given to the patient Continued inhaled medications Note chest x-ray shows no active process

## 2012-11-09 ENCOUNTER — Telehealth: Payer: Self-pay | Admitting: Critical Care Medicine

## 2012-11-09 NOTE — Telephone Encounter (Signed)
Pt advised. Ashlee [name], CMA  

## 2012-11-09 NOTE — Telephone Encounter (Signed)
Per CY-as long as she is not having an atcive fever or flu like symptoms; just congestion patient is okay to get Prevnar injection. Thanks.

## 2012-11-13 ENCOUNTER — Ambulatory Visit (INDEPENDENT_AMBULATORY_CARE_PROVIDER_SITE_OTHER): Payer: Medicare Other

## 2012-11-13 DIAGNOSIS — Z23 Encounter for immunization: Secondary | ICD-10-CM

## 2012-12-09 ENCOUNTER — Other Ambulatory Visit: Payer: Self-pay | Admitting: Critical Care Medicine

## 2012-12-11 ENCOUNTER — Telehealth: Payer: Self-pay | Admitting: Critical Care Medicine

## 2012-12-11 NOTE — Telephone Encounter (Signed)
Per pt's chart, her advair was refilled this morning Called [facility] [address] and spoke with pharm tech Crystal to verify that rx was received and is ready for pickup Called spoke with patient, informed her of the above.  Pt grateful and verbalized her understanding.  Nothing further needed; will sign off.

## 2012-12-11 NOTE — Telephone Encounter (Signed)
pts insurance no longer will cover advair  Ok to change to symbicort 160 two puff bid

## 2012-12-12 MED ORDER — BUDESONIDE-FORMOTEROL FUMARATE 160-4.5 MCG/ACT IN AERO: 2.0000 | INHALATION_SPRAY | Freq: Two times a day (BID) | RESPIRATORY_TRACT | Status: DC

## 2012-12-12 NOTE — Telephone Encounter (Signed)
Called, spoke with pt's daughter.  Was advised pt is still sleeping.  She will ask pt to call office back when she wakes up.

## 2012-12-12 NOTE — Telephone Encounter (Signed)
Pt is returning Crystal's call.  Holly D Pryor ° °

## 2012-12-12 NOTE — Telephone Encounter (Signed)
Pt aware and rx for symbicort sent. Pharmacy verified with the pt. Carron Curie, CMA

## 2012-12-13 NOTE — Telephone Encounter (Signed)
Med list updated with below change.

## 2012-12-13 NOTE — Addendum Note (Signed)
Addended by: Gweneth Dimitri D on: 12/13/2012 08:44 AM   Modules accepted: Orders, Medications

## 2013-05-25 ENCOUNTER — Encounter: Payer: Self-pay | Admitting: Critical Care Medicine

## 2013-06-27 ENCOUNTER — Ambulatory Visit (INDEPENDENT_AMBULATORY_CARE_PROVIDER_SITE_OTHER): Payer: Medicare Other | Admitting: Critical Care Medicine

## 2013-06-27 ENCOUNTER — Encounter: Payer: Self-pay | Admitting: Critical Care Medicine

## 2013-06-27 VITALS — BP 142/84 | HR 101 | Temp 98.1°F | Ht 65.5 in | Wt 129.8 lb

## 2013-06-27 DIAGNOSIS — R079 Chest pain, unspecified: Secondary | ICD-10-CM

## 2013-06-27 DIAGNOSIS — J449 Chronic obstructive pulmonary disease, unspecified: Secondary | ICD-10-CM

## 2013-06-27 DIAGNOSIS — I739 Peripheral vascular disease, unspecified: Secondary | ICD-10-CM

## 2013-06-27 MED ORDER — TIOTROPIUM BROMIDE MONOHYDRATE 2.5 MCG/ACT IN AERS: 2.0000 | INHALATION_SPRAY | Freq: Every day | RESPIRATORY_TRACT | Status: DC

## 2013-06-27 NOTE — Progress Notes (Signed)
Subjective:    Patient ID: Ashlee [name], female    DOB: Mar 25, 1935, 78 y.o.   MRN: [id number]  HPI  Pulmonary: 78 y.o.  WF with COPD and primary emphysema--current smoker    06/27/2013 Chief Complaint  Patient presents with  . Acute Visit    increased SOB, chest pain, back pain, constant HA x 3 months.  Occas cough with pale yellow mucus.    Pt notes more dyspnea for 3 months.  Notes min cough, this is occ prod mucus, yellow.  Notes more chest pain and in back area. Smokes 2cigs/week.  Notes more edema,     Review of Systems  Constitutional:   No  weight loss, night sweats,  Fevers, chills, fatigue, lassitude. HEENT:   No headaches,  Difficulty swallowing,  Tooth/dental problems,  Sore throat,                No sneezing, itching, ear ache, nasal congestion, post nasal drip,   CV:  Notes chest pain,  Orthopnea, PND, swelling in lower extremities, anasarca, dizziness, palpitations  GI  No heartburn, indigestion, abdominal pain, nausea, vomiting, diarrhea, change in bowel habits, loss of appetite  Resp: Notes shortness of breath with exertion not at rest.  No excess mucus, no productive cough,  Notes  non-productive cough,  No coughing up of blood.  No change in color of mucus.  No wheezing.  No chest wall deformity  Skin: no rash or lesions.  GU: no dysuria, change in color of urine, no urgency or frequency.  No flank pain.  MS:  No joint pain or swelling.  No decreased range of motion.  No back pain.  Psych:  No change in mood or affect. No depression or anxiety.  No memory loss.     Objective:   Physical Exam  Filed Vitals:   06/27/13 1136  BP: 142/84  Pulse: 101  Temp: 98.1 F (36.7 C)  TempSrc: Oral  Height: 5' 5.5" (1.664 m)  Weight: 129 lb 12.8 oz (58.877 kg)  SpO2: 92%    Gen: Pleasant, well-nourished, in no distress,  normal affect  ENT: No lesions,  mouth clear,  oropharynx clear, no postnasal drip  Neck: No JVD, no TMG, no carotid bruits  Lungs: No  use of accessory muscles, no dullness to percussion, distant BS  Cardiovascular: RRR, heart sounds normal, no murmur or gallops, no peripheral edema  Abdomen: soft and NT, no HSM,  BS normal  Musculoskeletal: No deformities, no cyanosis or clubbing  Neuro: alert, non focal  Skin: Warm, no lesions or rashes      No results found.  Assessment & Plan:   PVD This patient is complaining of increasing lower extremity claudication with exertion Plan Vascular surgery evaluation  copd Gold C COPD gold stage C. with still active smoking use Plan Maintain inhaled medications as prescribed Limit smoking  Chest pain This patient's had progressive exertional chest pain with radiation down the left arm for the past several weeks and associated increased weakness and fatigue along with dyspnea Plan Given history of carotid artery disease and ongoing smoking and concerned about the possibility of coronary artery disease. Note the patient had a stress Myoview in 2011 that was unremarkable. The patient has not had a cardiac catheterization to my knowledge. Plan Referral to cardiology for evaluation of chest pain and possible angina    Updated Medication List Outpatient Encounter Prescriptions as of 06/27/2013  Medication Sig  . acetaminophen (TYLENOL) 500 MG tablet Take  500 mg by mouth every 6 (six) hours as needed for pain.   Marland Kitchen ADVAIR DISKUS 250-50 MCG/DOSE AEPB INHALE 1 PUFF INTO THE LUNGS 2 (TWO) TIMES DAILY.  Marland Kitchen ALPRAZolam (XANAX) 1 MG tablet Take 1 mg by mouth 2 (two) times daily.   Marland Kitchen aspirin 325 MG tablet Take 325 mg by mouth every other day.  . bisacodyl (EQ WOMANS LAXATIVE) 5 MG EC tablet Take 5 mg by mouth daily as needed for constipation.   . budesonide-formoterol (SYMBICORT) 160-4.5 MCG/ACT inhaler Inhale 2 puffs into the lungs 2 (two) times daily.  . Calcium Carbonate-Vitamin D (CALTRATE 600+D) 600-400 MG-UNIT per tablet Take 1 tablet by mouth 2 (two) times daily.    .  Cyanocobalamin (VITAMIN B-12 PO) Take 1 tablet by mouth daily.  . ferrous sulfate 325 (65 FE) MG tablet Take 325 mg by mouth daily with breakfast.  . omeprazole (PRILOSEC) 40 MG capsule Take 40 mg by mouth daily as needed (heart burn).   . polyethylene glycol powder (MIRALAX) powder Take 17 g by mouth daily as needed. constipation  . pravastatin (PRAVACHOL) 80 MG tablet Take 80 mg by mouth every morning.   Marland Kitchen PROAIR HFA 108 (90 BASE) MCG/ACT inhaler INHALE 2 PUFFS EVERY 6 HOURS AS NEEDED.  . [DISCONTINUED] albuterol (PROVENTIL HFA;VENTOLIN HFA) 108 (90 BASE) MCG/ACT inhaler Inhale 2 puffs into the lungs every 6 (six) hours as needed for wheezing.  . [DISCONTINUED] tiotropium (SPIRIVA) 18 MCG inhalation capsule Place 1 capsule (18 mcg total) into inhaler and inhale daily.  . Tiotropium Bromide Monohydrate (SPIRIVA RESPIMAT) 2.5 MCG/ACT AERS Inhale 2 puffs into the lungs daily.  . [DISCONTINUED] fish oil-omega-3 fatty acids 1000 MG capsule Take 1 g by mouth daily.  . [DISCONTINUED] predniSONE (DELTASONE) 10 MG tablet Take 4 tabs daily x 2 days, 3 tabs daily x 2 days, 2 tabs daily x 2 days, 1 tab daily x 2 days, then stop.  . [DISCONTINUED] VITAMIN E PO Take 1 capsule by mouth daily.

## 2013-06-27 NOTE — Patient Instructions (Signed)
An appointment will be made with vascular surgery Dr Scot Dock or Fields to check leg and neck circulation An appointment will be made with Cardiology to check you heart function Limit smoking No change in inhaled medications Return 4 months

## 2013-06-28 DIAGNOSIS — R079 Chest pain, unspecified: Secondary | ICD-10-CM | POA: Insufficient documentation

## 2013-06-28 NOTE — Assessment & Plan Note (Signed)
This patient's had progressive exertional chest pain with radiation down the left arm for the past several weeks and associated increased weakness and fatigue along with dyspnea Plan Given history of carotid artery disease and ongoing smoking and concerned about the possibility of coronary artery disease. Note the patient had a stress Myoview in 2011 that was unremarkable. The patient has not had a cardiac catheterization to my knowledge. Plan Referral to cardiology for evaluation of chest pain and possible angina

## 2013-06-28 NOTE — Assessment & Plan Note (Signed)
This patient is complaining of increasing lower extremity claudication with exertion Plan Vascular surgery evaluation

## 2013-06-28 NOTE — Assessment & Plan Note (Signed)
COPD gold stage C. with still active smoking use Plan Maintain inhaled medications as prescribed Limit smoking

## 2013-06-29 ENCOUNTER — Encounter: Payer: Self-pay | Admitting: Cardiology

## 2013-06-29 ENCOUNTER — Other Ambulatory Visit: Payer: Self-pay | Admitting: *Deleted

## 2013-06-29 ENCOUNTER — Ambulatory Visit (INDEPENDENT_AMBULATORY_CARE_PROVIDER_SITE_OTHER): Payer: Medicare Other | Admitting: Cardiology

## 2013-06-29 VITALS — BP 184/94 | HR 113 | Ht 65.5 in | Wt 130.1 lb

## 2013-06-29 DIAGNOSIS — I739 Peripheral vascular disease, unspecified: Secondary | ICD-10-CM

## 2013-06-29 DIAGNOSIS — R079 Chest pain, unspecified: Secondary | ICD-10-CM

## 2013-06-29 DIAGNOSIS — L98499 Non-pressure chronic ulcer of skin of other sites with unspecified severity: Secondary | ICD-10-CM

## 2013-06-29 DIAGNOSIS — I6529 Occlusion and stenosis of unspecified carotid artery: Secondary | ICD-10-CM

## 2013-06-29 DIAGNOSIS — R072 Precordial pain: Secondary | ICD-10-CM

## 2013-06-29 DIAGNOSIS — I10 Essential (primary) hypertension: Secondary | ICD-10-CM
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
## 2013-06-29 NOTE — Patient Instructions (Signed)
The current medical regimen is effective;  continue present plan and medications.  Your physician has requested that you have a carotid duplex. This test is an ultrasound of the carotid arteries in your neck. It looks at blood flow through these arteries that supply the brain with blood. Allow one hour for this exam. There are no restrictions or special instructions.  Your physician has requested that you have a lexiscan myoview. For further information please visit [email or web address]. Please follow instruction sheet, as given.  Follow up will be based on these results.

## 2013-07-02 ENCOUNTER — Encounter: Payer: Self-pay | Admitting: Cardiology

## 2013-07-03 ENCOUNTER — Telehealth: Payer: Self-pay | Admitting: Critical Care Medicine

## 2013-07-03 NOTE — Telephone Encounter (Signed)
Per OV 06/27/13; Patient Instructions      An appointment will be made with vascular surgery Dr Scot Dock or Fields to check leg and neck circulation  --  I called spoke with Ashlee [name]. She reports the appt we scheduled with vascular surgery conflicted with her PCP appt. She cancelled this appt and r/s to august 27. FYI for Dr. Joya Gaskins

## 2013-07-03 NOTE — Telephone Encounter (Signed)
Noted  

## 2013-07-08 ENCOUNTER — Other Ambulatory Visit: Payer: Self-pay | Admitting: Critical Care Medicine

## 2013-07-11 ENCOUNTER — Ambulatory Visit (HOSPITAL_COMMUNITY): Payer: Medicare Other | Attending: Cardiology | Admitting: Cardiology

## 2013-07-11 ENCOUNTER — Ambulatory Visit (HOSPITAL_BASED_OUTPATIENT_CLINIC_OR_DEPARTMENT_OTHER): Payer: Medicare Other | Admitting: Radiology

## 2013-07-11 VITALS — BP 143/73 | HR 100 | Ht 65.5 in | Wt 131.0 lb

## 2013-07-11 DIAGNOSIS — I658 Occlusion and stenosis of other precerebral arteries: Secondary | ICD-10-CM | POA: Insufficient documentation

## 2013-07-11 DIAGNOSIS — I10 Essential (primary) hypertension: Secondary | ICD-10-CM | POA: Insufficient documentation

## 2013-07-11 DIAGNOSIS — R0602 Shortness of breath: Secondary | ICD-10-CM | POA: Insufficient documentation

## 2013-07-11 DIAGNOSIS — I6529 Occlusion and stenosis of unspecified carotid artery: Secondary | ICD-10-CM | POA: Insufficient documentation

## 2013-07-11 DIAGNOSIS — R072 Precordial pain: Secondary | ICD-10-CM

## 2013-07-11 DIAGNOSIS — R0609 Other forms of dyspnea: Secondary | ICD-10-CM | POA: Diagnosis not present

## 2013-07-11 DIAGNOSIS — R42 Dizziness and giddiness: Secondary | ICD-10-CM | POA: Diagnosis not present

## 2013-07-11 DIAGNOSIS — R0989 Other specified symptoms and signs involving the circulatory and respiratory systems: Secondary | ICD-10-CM | POA: Insufficient documentation

## 2013-07-11 DIAGNOSIS — R079 Chest pain, unspecified: Secondary | ICD-10-CM | POA: Insufficient documentation

## 2013-07-11 MED ORDER — TECHNETIUM TC 99M SESTAMIBI GENERIC - CARDIOLITE
11.0000 | Freq: Once | INTRAVENOUS | Status: AC | PRN
Start: 2013-07-11 — End: 2013-07-11
  Administered 2013-07-11: 11 via INTRAVENOUS

## 2013-07-11 MED ORDER — REGADENOSON 0.4 MG/5ML IV SOLN
0.4000 mg | Freq: Once | INTRAVENOUS | Status: AC
  Administered 2013-07-11: 0.4 mg via INTRAVENOUS

## 2013-07-11 MED ORDER — AMINOPHYLLINE 25 MG/ML IV SOLN
75.0000 mg | Freq: Two times a day (BID) | INTRAVENOUS | Status: DC | PRN
  Administered 2013-07-11: 75 mg via INTRAVENOUS

## 2013-07-11 MED ORDER — TECHNETIUM TC 99M SESTAMIBI GENERIC - CARDIOLITE
33.0000 | Freq: Once | INTRAVENOUS | Status: AC | PRN
  Administered 2013-07-11: 33 via INTRAVENOUS

## 2013-07-11 NOTE — Progress Notes (Signed)
Carotid duplex performed 
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
## 2013-07-12 ENCOUNTER — Ambulatory Visit: Payer: Medicare Other | Admitting: Internal Medicine

## 2013-07-26 ENCOUNTER — Ambulatory Visit: Payer: Medicare Other | Admitting: Cardiology

## 2013-08-02 ENCOUNTER — Ambulatory Visit: Payer: Medicare Other | Admitting: Vascular Surgery

## 2013-08-02 ENCOUNTER — Encounter (HOSPITAL_COMMUNITY): Payer: Medicare Other

## 2013-08-02 ENCOUNTER — Other Ambulatory Visit (HOSPITAL_COMMUNITY): Payer: Medicare Other

## 2013-08-23 ENCOUNTER — Encounter: Payer: Self-pay | Admitting: Internal Medicine

## 2013-09-05 ENCOUNTER — Encounter: Payer: Self-pay | Admitting: Vascular Surgery

## 2013-09-06 ENCOUNTER — Ambulatory Visit (HOSPITAL_COMMUNITY)
Admission: RE | Admit: 2013-09-06 | Discharge: 2013-09-06 | Disposition: A | Discharge status: Home / Self Care | Payer: Medicare Other | Source: Ambulatory Visit | Attending: Vascular Surgery | Admitting: Vascular Surgery

## 2013-09-06 ENCOUNTER — Other Ambulatory Visit: Payer: Self-pay | Admitting: *Deleted

## 2013-09-06 ENCOUNTER — Ambulatory Visit (INDEPENDENT_AMBULATORY_CARE_PROVIDER_SITE_OTHER): Payer: Medicare Other | Admitting: Vascular Surgery

## 2013-09-06 ENCOUNTER — Other Ambulatory Visit (HOSPITAL_COMMUNITY): Payer: Medicare Other

## 2013-09-06 ENCOUNTER — Encounter: Payer: Self-pay | Admitting: Vascular Surgery

## 2013-09-06 VITALS — BP 155/119 | HR 114 | Ht 65.5 in | Wt 130.0 lb

## 2013-09-06 DIAGNOSIS — I70219 Atherosclerosis of native arteries of extremities with intermittent claudication, unspecified extremity: Secondary | ICD-10-CM | POA: Diagnosis not present

## 2013-09-06 DIAGNOSIS — E785 Hyperlipidemia, unspecified: Secondary | ICD-10-CM | POA: Insufficient documentation

## 2013-09-06 DIAGNOSIS — G5790 Unspecified mononeuropathy of unspecified lower limb: Secondary | ICD-10-CM | POA: Insufficient documentation

## 2013-09-06 DIAGNOSIS — J449 Chronic obstructive pulmonary disease, unspecified: Secondary | ICD-10-CM | POA: Insufficient documentation

## 2013-09-06 DIAGNOSIS — F17200 Nicotine dependence, unspecified, uncomplicated: Secondary | ICD-10-CM | POA: Insufficient documentation

## 2013-09-06 DIAGNOSIS — J4489 Other specified chronic obstructive pulmonary disease: Secondary | ICD-10-CM | POA: Insufficient documentation

## 2013-09-06 DIAGNOSIS — L98499 Non-pressure chronic ulcer of skin of other sites with unspecified severity: Secondary | ICD-10-CM

## 2013-09-06 DIAGNOSIS — M549 Dorsalgia, unspecified: Secondary | ICD-10-CM | POA: Diagnosis not present

## 2013-09-06 DIAGNOSIS — I10 Essential (primary) hypertension: Secondary | ICD-10-CM | POA: Diagnosis not present

## 2013-09-06 DIAGNOSIS — I6529 Occlusion and stenosis of unspecified carotid artery: Secondary | ICD-10-CM

## 2013-09-06 DIAGNOSIS — Z9981 Dependence on supplemental oxygen: Secondary | ICD-10-CM | POA: Diagnosis not present

## 2013-09-06 DIAGNOSIS — I739 Peripheral vascular disease, unspecified: Secondary | ICD-10-CM
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
## 2013-10-29 ENCOUNTER — Ambulatory Visit: Payer: Medicare Other | Admitting: Critical Care Medicine

## 2013-11-26 ENCOUNTER — Encounter: Payer: Self-pay | Admitting: Critical Care Medicine

## 2013-11-26 ENCOUNTER — Ambulatory Visit (INDEPENDENT_AMBULATORY_CARE_PROVIDER_SITE_OTHER): Payer: Medicare Other | Admitting: Critical Care Medicine

## 2013-11-26 VITALS — BP 134/72 | HR 106 | Ht 65.5 in | Wt 128.8 lb

## 2013-11-26 DIAGNOSIS — I6529 Occlusion and stenosis of unspecified carotid artery: Secondary | ICD-10-CM

## 2013-11-26 DIAGNOSIS — J449 Chronic obstructive pulmonary disease, unspecified: Secondary | ICD-10-CM

## 2013-11-26 MED ORDER — FLUTICASONE-SALMETEROL 250-50 MCG/DOSE IN AEPB: INHALATION_SPRAY | RESPIRATORY_TRACT | Status: DC

## 2013-11-26 NOTE — Patient Instructions (Signed)
No change in medications. Return in         4 months 

## 2013-11-27 NOTE — Assessment & Plan Note (Signed)
Ongoing tobacco use but improved COPD gold stage C Plan Maintain inhaled medications as prescribed

## 2013-11-27 NOTE — Progress Notes (Signed)
Subjective:    Patient ID: Ashlee [name], female    DOB: 1935-05-17, 78 y.o.   MRN: [id number]  HPI 11/26/2013 Chief Complaint  Patient presents with  . Follow-up    SOB and cough are at baseline.  Cough is prod with white mucus.  No chest tightness/pain.  Patient notes dyspnea is at baseline. The patient is now only smoking 2 cigarettes every few weeks. Pt denies any significant sore throat, nasal congestion or excess secretions, fever, chills, sweats, unintended weight loss, pleurtic or exertional chest pain, orthopnea PND, or leg swelling Pt denies any increase in rescue therapy over baseline, denies waking up needing it or having any early am or nocturnal exacerbations of coughing/wheezing/or dyspnea. Pt also denies any obvious fluctuation in symptoms with  weather or environmental change or other alleviating or aggravating factors      Review of Systems Constitutional:   No  weight loss, night sweats,  Fevers, chills, fatigue, lassitude. HEENT:   No headaches,  Difficulty swallowing,  Tooth/dental problems,  Sore throat,                No sneezing, itching, ear ache, nasal congestion, post nasal drip,   CV:  No chest pain,  Orthopnea, PND, swelling in lower extremities, anasarca, dizziness, palpitations  GI  No heartburn, indigestion, abdominal pain, nausea, vomiting, diarrhea, change in bowel habits, loss of appetite  Resp: No shortness of breath with exertion or at rest.  No excess mucus, no productive cough,  No non-productive cough,  No coughing up of blood.  No change in color of mucus.  No wheezing.  No chest wall deformity  Skin: no rash or lesions.  GU: no dysuria, change in color of urine, no urgency or frequency.  No flank pain.  MS:  No joint pain or swelling.  No decreased range of motion.  No back pain.  Psych:  No change in mood or affect. No depression or anxiety.  No memory loss.     Objective:   Physical Exam Filed Vitals:   11/26/13 1223  BP: 134/72    Pulse: 106  Height: 5' 5.5" (1.664 m)  Weight: 128 lb 12.8 oz (58.423 kg)  SpO2: 92%    Gen: Pleasant, well-nourished, in no distress,  normal affect  ENT: No lesions,  mouth clear,  oropharynx clear, no postnasal drip  Neck: No JVD, no TMG, no carotid bruits  Lungs: No use of accessory muscles, no dullness to percussion, distant BS  Cardiovascular: RRR, heart sounds normal, no murmur or gallops, no peripheral edema  Abdomen: soft and NT, no HSM,  BS normal  Musculoskeletal: No deformities, no cyanosis or clubbing  Neuro: alert, non focal  Skin: Warm, no lesions or rashes  No results found.       Assessment & Plan:   copd Gold C Ongoing tobacco use but improved COPD gold stage C Plan Maintain inhaled medications as prescribed   Updated Medication List Outpatient Encounter Prescriptions as of 11/26/2013  Medication Sig  . acetaminophen (TYLENOL) 500 MG tablet Take 500 mg by mouth every 6 (six) hours as needed for pain.   Marland Kitchen ALPRAZolam (XANAX) 1 MG tablet Take 1 mg by mouth 2 (two) times daily.   Marland Kitchen amLODipine (NORVASC) 5 MG tablet Take 1 tablet by mouth daily.  Marland Kitchen aspirin 325 MG tablet Take 325 mg by mouth every other day.  . bisacodyl (EQ WOMANS LAXATIVE) 5 MG EC tablet Take 5 mg by mouth daily as  needed for constipation.   . Calcium Carbonate-Vitamin D (CALTRATE 600+D) 600-400 MG-UNIT per tablet Take 1 tablet by mouth 2 (two) times daily.    . [employer] NASAL SPRAY 0.05 % nasal spray as needed.  . Cyanocobalamin (VITAMIN B-12 PO) Take 1 tablet by mouth daily.  . ferrous sulfate 325 (65 FE) MG tablet Take 325 mg by mouth daily with breakfast.  . Fluticasone-Salmeterol (ADVAIR DISKUS) 250-50 MCG/DOSE AEPB INHALE 1 PUFF INTO THE LUNGS 2 (TWO) TIMES DAILY.  . hydrochlorothiazide (MICROZIDE) 12.5 MG capsule Take 1 capsule by mouth daily.  Marland Kitchen omeprazole (PRILOSEC) 40 MG capsule Take 40 mg by mouth daily as needed (heart burn).   . polyethylene glycol powder (MIRALAX) powder  Take 17 g by mouth daily as needed. constipation  . pravastatin (PRAVACHOL) 80 MG tablet Take 80 mg by mouth every morning.   Marland Kitchen PROAIR HFA 108 (90 BASE) MCG/ACT inhaler INHALE 2 PUFFS EVERY 6 HOURS AS NEEDED.  Marland Kitchen Tiotropium Bromide Monohydrate (SPIRIVA RESPIMAT) 2.5 MCG/ACT AERS Inhale 2 puffs into the lungs daily.  . [DISCONTINUED] ADVAIR DISKUS 250-50 MCG/DOSE AEPB INHALE 1 PUFF INTO THE LUNGS 2 (TWO) TIMES DAILY.  . [DISCONTINUED] budesonide-formoterol (SYMBICORT) 160-4.5 MCG/ACT inhaler Inhale 2 puffs into the lungs 2 (two) times daily.

## 2013-12-04 ENCOUNTER — Emergency Department (HOSPITAL_COMMUNITY): Payer: Medicare Other

## 2013-12-04 ENCOUNTER — Inpatient Hospital Stay (HOSPITAL_COMMUNITY)
Admission: EM | Admit: 2013-12-04 | Discharge: 2013-12-07 | DRG: 191 | Disposition: A | Discharge status: Home / Self Care | Payer: Medicare Other | Attending: Internal Medicine | Admitting: Internal Medicine

## 2013-12-04 ENCOUNTER — Encounter (HOSPITAL_COMMUNITY): Payer: Self-pay | Admitting: Emergency Medicine

## 2013-12-04 DIAGNOSIS — E785 Hyperlipidemia, unspecified: Secondary | ICD-10-CM | POA: Diagnosis present

## 2013-12-04 DIAGNOSIS — L309 Dermatitis, unspecified: Secondary | ICD-10-CM | POA: Diagnosis present

## 2013-12-04 DIAGNOSIS — R739 Hyperglycemia, unspecified: Secondary | ICD-10-CM | POA: Diagnosis present

## 2013-12-04 DIAGNOSIS — D529 Folate deficiency anemia, unspecified: Secondary | ICD-10-CM | POA: Diagnosis present

## 2013-12-04 DIAGNOSIS — D539 Nutritional anemia, unspecified: Secondary | ICD-10-CM | POA: Diagnosis present

## 2013-12-04 DIAGNOSIS — Z888 Allergy status to other drugs, medicaments and biological substances status: Secondary | ICD-10-CM | POA: Diagnosis not present

## 2013-12-04 DIAGNOSIS — I10 Essential (primary) hypertension: Secondary | ICD-10-CM | POA: Diagnosis present

## 2013-12-04 DIAGNOSIS — E876 Hypokalemia: Secondary | ICD-10-CM | POA: Diagnosis present

## 2013-12-04 DIAGNOSIS — Z86010 Personal history of colonic polyps: Secondary | ICD-10-CM | POA: Diagnosis not present

## 2013-12-04 DIAGNOSIS — R0602 Shortness of breath: Secondary | ICD-10-CM

## 2013-12-04 DIAGNOSIS — J449 Chronic obstructive pulmonary disease, unspecified: Secondary | ICD-10-CM | POA: Diagnosis present

## 2013-12-04 DIAGNOSIS — I6529 Occlusion and stenosis of unspecified carotid artery: Secondary | ICD-10-CM | POA: Diagnosis present

## 2013-12-04 DIAGNOSIS — Z720 Tobacco use: Secondary | ICD-10-CM

## 2013-12-04 DIAGNOSIS — Z7982 Long term (current) use of aspirin: Secondary | ICD-10-CM

## 2013-12-04 DIAGNOSIS — G629 Polyneuropathy, unspecified: Secondary | ICD-10-CM | POA: Diagnosis present

## 2013-12-04 DIAGNOSIS — J9611 Chronic respiratory failure with hypoxia: Secondary | ICD-10-CM | POA: Diagnosis present

## 2013-12-04 DIAGNOSIS — K219 Gastro-esophageal reflux disease without esophagitis: Secondary | ICD-10-CM | POA: Diagnosis present

## 2013-12-04 DIAGNOSIS — R740 Nonspecific elevation of levels of transaminase and lactic acid dehydrogenase [LDH]: Secondary | ICD-10-CM | POA: Diagnosis present

## 2013-12-04 DIAGNOSIS — E871 Hypo-osmolality and hyponatremia: Secondary | ICD-10-CM | POA: Diagnosis present

## 2013-12-04 DIAGNOSIS — F17210 Nicotine dependence, cigarettes, uncomplicated: Secondary | ICD-10-CM | POA: Diagnosis present

## 2013-12-04 DIAGNOSIS — J45909 Unspecified asthma, uncomplicated: Secondary | ICD-10-CM | POA: Diagnosis present

## 2013-12-04 DIAGNOSIS — J441 Chronic obstructive pulmonary disease with (acute) exacerbation: Principal | ICD-10-CM | POA: Diagnosis present

## 2013-12-04 DIAGNOSIS — Z66 Do not resuscitate: Secondary | ICD-10-CM | POA: Diagnosis present

## 2013-12-04 DIAGNOSIS — F17200 Nicotine dependence, unspecified, uncomplicated: Secondary | ICD-10-CM | POA: Diagnosis present

## 2013-12-04 LAB — COMPREHENSIVE METABOLIC PANEL
ALBUMIN: 3.7 g/dL (ref 3.5–5.2)
ALT: 12 U/L (ref 0–35)
AST: 22 U/L (ref 0–37)
Alkaline Phosphatase: 118 U/L — ABNORMAL HIGH (ref 39–117)
Anion gap: 16 — ABNORMAL HIGH (ref 5–15)
BUN: 13 mg/dL (ref 6–23)
CO2: 29 mEq/L (ref 19–32)
CREATININE: 0.99 mg/dL (ref 0.50–1.10)
Calcium: 10.3 mg/dL (ref 8.4–10.5)
Chloride: 91 mEq/L — ABNORMAL LOW (ref 96–112)
GFR calc Af Amer: 63 mL/min — ABNORMAL LOW (ref >90)
GFR calc non Af Amer: 54 mL/min — ABNORMAL LOW (ref >90)
Glucose, Bld: 180 mg/dL — ABNORMAL HIGH (ref 70–99)
POTASSIUM: 3.5 meq/L — AB (ref 3.7–5.3)
Sodium: 136 mEq/L — ABNORMAL LOW (ref 137–147)
Total Bilirubin: 0.5 mg/dL (ref 0.3–1.2)
Total Protein: 8 g/dL (ref 6.0–8.3)

## 2013-12-04 LAB — CBC WITH DIFFERENTIAL/PLATELET
BASOS ABS: 0 10*3/uL (ref 0.0–0.1)
BASOS PCT: 0 % (ref 0–1)
Eosinophils Absolute: 0.2 10*3/uL (ref 0.0–0.7)
Eosinophils Relative: 2 % (ref 0–5)
HCT: 47.3 % — ABNORMAL HIGH (ref 36.0–46.0)
Hemoglobin: 16.4 g/dL — ABNORMAL HIGH (ref 12.0–15.0)
Lymphocytes Relative: 25 % (ref 12–46)
Lymphs Abs: 2.3 10*3/uL (ref 0.7–4.0)
MCH: 32.8 pg (ref 26.0–34.0)
MCHC: 34.7 g/dL (ref 30.0–36.0)
MCV: 94.6 fL (ref 78.0–100.0)
MONO ABS: 0.8 10*3/uL (ref 0.1–1.0)
Monocytes Relative: 9 % (ref 3–12)
NEUTROS ABS: 5.7 10*3/uL (ref 1.7–7.7)
NEUTROS PCT: 64 % (ref 43–77)
Platelets: 234 10*3/uL (ref 150–400)
RBC: 5 MIL/uL (ref 3.87–5.11)
RDW: 13.8 % (ref 11.5–15.5)
WBC: 9 10*3/uL (ref 4.0–10.5)

## 2013-12-04 LAB — TROPONIN I

## 2013-12-04 LAB — I-STAT CG4 LACTIC ACID, ED: LACTIC ACID, VENOUS: 2.68 mmol/L — AB (ref 0.5–2.2)

## 2013-12-04 LAB — URINALYSIS, ROUTINE W REFLEX MICROSCOPIC
BILIRUBIN URINE: NEGATIVE
Glucose, UA: NEGATIVE mg/dL
Hgb urine dipstick: NEGATIVE
Ketones, ur: NEGATIVE mg/dL
LEUKOCYTES UA: NEGATIVE
NITRITE: NEGATIVE
PH: 6.5 (ref 5.0–8.0)
Protein, ur: NEGATIVE mg/dL
SPECIFIC GRAVITY, URINE: 1.009 (ref 1.005–1.030)
Urobilinogen, UA: 1 mg/dL (ref 0.0–1.0)

## 2013-12-04 LAB — INFLUENZA PANEL BY PCR (TYPE A & B)
H1N1 flu by pcr: NOT DETECTED
INFLAPCR: NEGATIVE
Influenza B By PCR: NEGATIVE

## 2013-12-04 LAB — BLOOD GAS, ARTERIAL
Acid-Base Excess: 6.2 mmol/L — ABNORMAL HIGH (ref 0.0–2.0)
BICARBONATE: 30.2 meq/L — AB (ref 20.0–24.0)
Drawn by: 295031
O2 Content: 3 L/min
O2 Saturation: 94.4 %
PO2 ART: 67.7 mmHg — AB (ref 80.0–100.0)
Patient temperature: 98.6
TCO2: 25.3 mmol/L (ref 0–100)
pCO2 arterial: 42.4 mmHg (ref 35.0–45.0)
pH, Arterial: 7.467 — ABNORMAL HIGH (ref 7.350–7.450)

## 2013-12-04 LAB — GLUCOSE, CAPILLARY: Glucose-Capillary: 233 mg/dL — ABNORMAL HIGH (ref 70–99)

## 2013-12-04 LAB — PRO B NATRIURETIC PEPTIDE: PRO B NATRI PEPTIDE: 368.9 pg/mL (ref 0–450)

## 2013-12-04 MED ORDER — POLYETHYLENE GLYCOL 3350 17 GM/SCOOP PO POWD
17.0000 g | Freq: Every day | ORAL | Status: DC | PRN
  Filled 2013-12-04: qty 255

## 2013-12-04 MED ORDER — ALBUTEROL SULFATE (2.5 MG/3ML) 0.083% IN NEBU: 2.5000 mg | INHALATION_SOLUTION | Freq: Four times a day (QID) | RESPIRATORY_TRACT | Status: DC | PRN

## 2013-12-04 MED ORDER — ALBUTEROL SULFATE (2.5 MG/3ML) 0.083% IN NEBU: 2.5000 mg | INHALATION_SOLUTION | RESPIRATORY_TRACT | Status: DC | PRN

## 2013-12-04 MED ORDER — TIOTROPIUM BROMIDE MONOHYDRATE 2.5 MCG/ACT IN AERS: 2.0000 | INHALATION_SPRAY | Freq: Every day | RESPIRATORY_TRACT | Status: DC

## 2013-12-04 MED ORDER — TIOTROPIUM BROMIDE MONOHYDRATE 18 MCG IN CAPS
18.0000 ug | ORAL_CAPSULE | Freq: Every day | RESPIRATORY_TRACT | Status: DC
  Administered 2013-12-05 – 2013-12-07 (×3): 18 ug via RESPIRATORY_TRACT
  Filled 2013-12-04: qty 5

## 2013-12-04 MED ORDER — MOMETASONE FURO-FORMOTEROL FUM 100-5 MCG/ACT IN AERO
2.0000 | INHALATION_SPRAY | Freq: Two times a day (BID) | RESPIRATORY_TRACT | Status: DC
  Administered 2013-12-04 – 2013-12-07 (×6): 2 via RESPIRATORY_TRACT
  Filled 2013-12-04: qty 8.8

## 2013-12-04 MED ORDER — ALPRAZOLAM 0.5 MG PO TABS
0.5000 mg | ORAL_TABLET | Freq: Two times a day (BID) | ORAL | Status: DC
  Administered 2013-12-05 – 2013-12-07 (×6): 0.5 mg via ORAL
  Filled 2013-12-04 (×6): qty 1

## 2013-12-04 MED ORDER — HYDROCHLOROTHIAZIDE 12.5 MG PO CAPS
12.5000 mg | ORAL_CAPSULE | Freq: Every day | ORAL | Status: DC
  Administered 2013-12-05 – 2013-12-06 (×2): 12.5 mg via ORAL
  Filled 2013-12-04 (×2): qty 1

## 2013-12-04 MED ORDER — ACETAMINOPHEN 650 MG RE SUPP: 650.0000 mg | Freq: Four times a day (QID) | RECTAL | Status: DC | PRN

## 2013-12-04 MED ORDER — ALPRAZOLAM 1 MG PO TABS
1.0000 mg | ORAL_TABLET | Freq: Every day | ORAL | Status: DC
  Administered 2013-12-04 – 2013-12-06 (×3): 1 mg via ORAL
  Filled 2013-12-04 (×3): qty 1

## 2013-12-04 MED ORDER — BISACODYL 5 MG PO TBEC: 5.0000 mg | DELAYED_RELEASE_TABLET | Freq: Every day | ORAL | Status: DC | PRN

## 2013-12-04 MED ORDER — GUAIFENESIN ER 600 MG PO TB12
600.0000 mg | ORAL_TABLET | Freq: Two times a day (BID) | ORAL | Status: DC
  Administered 2013-12-04 – 2013-12-07 (×6): 600 mg via ORAL
  Filled 2013-12-04 (×6): qty 1

## 2013-12-04 MED ORDER — POLYETHYLENE GLYCOL 3350 17 G PO PACK
17.0000 g | PACK | Freq: Every day | ORAL | Status: DC | PRN
  Administered 2013-12-05: 17 g via ORAL
  Filled 2013-12-04 (×2): qty 1

## 2013-12-04 MED ORDER — CALCIUM CARBONATE-VITAMIN D 500-200 MG-UNIT PO TABS
1.0000 | ORAL_TABLET | Freq: Two times a day (BID) | ORAL | Status: DC
  Administered 2013-12-04 – 2013-12-07 (×6): 1 via ORAL
  Filled 2013-12-04 (×7): qty 1

## 2013-12-04 MED ORDER — PRAVASTATIN SODIUM 80 MG PO TABS
80.0000 mg | ORAL_TABLET | Freq: Every day | ORAL | Status: DC
  Administered 2013-12-04 – 2013-12-06 (×3): 80 mg via ORAL
  Filled 2013-12-04 (×4): qty 1

## 2013-12-04 MED ORDER — ACETAMINOPHEN 325 MG PO TABS: 650.0000 mg | ORAL_TABLET | Freq: Four times a day (QID) | ORAL | Status: DC | PRN

## 2013-12-04 MED ORDER — ONDANSETRON HCL 4 MG/2ML IJ SOLN: 4.0000 mg | Freq: Four times a day (QID) | INTRAMUSCULAR | Status: DC | PRN

## 2013-12-04 MED ORDER — GUAIFENESIN-DM 100-10 MG/5ML PO SYRP: 5.0000 mL | ORAL_SOLUTION | ORAL | Status: DC | PRN

## 2013-12-04 MED ORDER — ONDANSETRON HCL 4 MG PO TABS: 4.0000 mg | ORAL_TABLET | Freq: Four times a day (QID) | ORAL | Status: DC | PRN

## 2013-12-04 MED ORDER — LEVOFLOXACIN IN D5W 750 MG/150ML IV SOLN
750.0000 mg | INTRAVENOUS | Status: DC
  Administered 2013-12-04: 750 mg via INTRAVENOUS
  Filled 2013-12-04 (×2): qty 150

## 2013-12-04 MED ORDER — INSULIN ASPART 100 UNIT/ML ~~LOC~~ SOLN
0.0000 [IU] | Freq: Every day | SUBCUTANEOUS | Status: DC
  Administered 2013-12-04: 2 [IU] via SUBCUTANEOUS

## 2013-12-04 MED ORDER — METHYLPREDNISOLONE SODIUM SUCC 125 MG IJ SOLR
60.0000 mg | Freq: Four times a day (QID) | INTRAMUSCULAR | Status: DC
  Administered 2013-12-04 – 2013-12-05 (×3): 60 mg via INTRAVENOUS
  Filled 2013-12-04 (×4): qty 0.96

## 2013-12-04 MED ORDER — METHYLPREDNISOLONE SODIUM SUCC 125 MG IJ SOLR
125.0000 mg | Freq: Once | INTRAMUSCULAR | Status: AC
  Administered 2013-12-04: 125 mg via INTRAVENOUS
  Filled 2013-12-04: qty 2

## 2013-12-04 MED ORDER — VITAMIN B-12 100 MCG PO TABS
100.0000 ug | ORAL_TABLET | Freq: Every day | ORAL | Status: DC
  Administered 2013-12-04 – 2013-12-07 (×4): 100 ug via ORAL
  Filled 2013-12-04 (×4): qty 1

## 2013-12-04 MED ORDER — OXYMETAZOLINE HCL 0.05 % NA SOLN
1.0000 | NASAL | Status: DC | PRN
  Filled 2013-12-04: qty 15

## 2013-12-04 MED ORDER — SODIUM CHLORIDE 0.9 % IJ SOLN
3.0000 mL | Freq: Two times a day (BID) | INTRAMUSCULAR | Status: DC
  Administered 2013-12-05 – 2013-12-07 (×3): 3 mL via INTRAVENOUS

## 2013-12-04 MED ORDER — ALUM & MAG HYDROXIDE-SIMETH 200-200-20 MG/5ML PO SUSP: 30.0000 mL | Freq: Four times a day (QID) | ORAL | Status: DC | PRN

## 2013-12-04 MED ORDER — AMLODIPINE BESYLATE 5 MG PO TABS
5.0000 mg | ORAL_TABLET | Freq: Every day | ORAL | Status: DC
  Administered 2013-12-05 – 2013-12-07 (×3): 5 mg via ORAL
  Filled 2013-12-04 (×3): qty 1

## 2013-12-04 MED ORDER — ENOXAPARIN SODIUM 40 MG/0.4ML ~~LOC~~ SOLN
40.0000 mg | SUBCUTANEOUS | Status: DC
  Administered 2013-12-04 – 2013-12-06 (×3): 40 mg via SUBCUTANEOUS
  Filled 2013-12-04 (×3): qty 0.4

## 2013-12-04 MED ORDER — INSULIN ASPART 100 UNIT/ML ~~LOC~~ SOLN
0.0000 [IU] | Freq: Three times a day (TID) | SUBCUTANEOUS | Status: DC
  Administered 2013-12-05 (×3): 3 [IU] via SUBCUTANEOUS
  Administered 2013-12-06 (×3): 2 [IU] via SUBCUTANEOUS

## 2013-12-04 MED ORDER — LEVALBUTEROL HCL 0.63 MG/3ML IN NEBU
0.6300 mg | INHALATION_SOLUTION | Freq: Four times a day (QID) | RESPIRATORY_TRACT | Status: DC
  Administered 2013-12-04: 0.63 mg via RESPIRATORY_TRACT
  Filled 2013-12-04: qty 3

## 2013-12-04 MED ORDER — LEVALBUTEROL HCL 0.63 MG/3ML IN NEBU
0.6300 mg | INHALATION_SOLUTION | Freq: Three times a day (TID) | RESPIRATORY_TRACT | Status: DC
  Administered 2013-12-05 – 2013-12-07 (×7): 0.63 mg via RESPIRATORY_TRACT
  Filled 2013-12-04 (×7): qty 3

## 2013-12-04 MED ORDER — ASPIRIN 325 MG PO TABS
325.0000 mg | ORAL_TABLET | Freq: Every day | ORAL | Status: DC
  Administered 2013-12-04 – 2013-12-07 (×4): 325 mg via ORAL
  Filled 2013-12-04 (×4): qty 1

## 2013-12-04 MED ORDER — ONDANSETRON HCL 4 MG/2ML IJ SOLN: 4.0000 mg | Freq: Three times a day (TID) | INTRAMUSCULAR | Status: DC | PRN

## 2013-12-04 MED ORDER — POTASSIUM CHLORIDE IN NACL 40-0.9 MEQ/L-% IV SOLN
INTRAVENOUS | Status: DC
  Administered 2013-12-04: 50 mL/h via INTRAVENOUS
  Filled 2013-12-04 (×2): qty 1000

## 2013-12-04 MED ORDER — IPRATROPIUM-ALBUTEROL 0.5-2.5 (3) MG/3ML IN SOLN
3.0000 mL | RESPIRATORY_TRACT | Status: DC
  Administered 2013-12-04: 3 mL via RESPIRATORY_TRACT
  Filled 2013-12-04: qty 3

## 2013-12-04 MED ORDER — ALPRAZOLAM 0.5 MG PO TABS: 0.5000 mg | ORAL_TABLET | Freq: Three times a day (TID) | ORAL | Status: DC

## 2013-12-04 MED ORDER — PANTOPRAZOLE SODIUM 40 MG PO TBEC
40.0000 mg | DELAYED_RELEASE_TABLET | Freq: Every day | ORAL | Status: DC
  Administered 2013-12-04 – 2013-12-07 (×4): 40 mg via ORAL
  Filled 2013-12-04 (×4): qty 1
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
## 2013-12-04 NOTE — ED Notes (Signed)
Bed: QV95 Expected date:  Expected time:  Means of arrival:  Comments: EMS Respiratory Distress

## 2013-12-04 NOTE — ED Notes (Addendum)
Per EMS: pt c/o SOB and fatigue x several days, hot to touch. Pt states she had BP meds changed about 1 month ago and has kind of felt bad since. Nausea no vomiting. Pt was sating 85% with fire, now on [place] sating at 95%

## 2013-12-04 NOTE — Progress Notes (Signed)
Pt instructed on use of Flutter Valve.  Pt demonstrated with good technique and effort.  Pt verbalizes understanding and use of Flutter and well.  RT to monitor and assess as needed.

## 2013-12-04 NOTE — ED Notes (Signed)
Pt placed on bedpan
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
## 2013-12-05 DIAGNOSIS — R739 Hyperglycemia, unspecified: Secondary | ICD-10-CM

## 2013-12-05 DIAGNOSIS — I10 Essential (primary) hypertension: Secondary | ICD-10-CM

## 2013-12-05 DIAGNOSIS — J441 Chronic obstructive pulmonary disease with (acute) exacerbation: Principal | ICD-10-CM

## 2013-12-05 LAB — BASIC METABOLIC PANEL
ANION GAP: 15 (ref 5–15)
BUN: 16 mg/dL (ref 6–23)
CALCIUM: 10.3 mg/dL (ref 8.4–10.5)
CO2: 28 mEq/L (ref 19–32)
Chloride: 93 mEq/L — ABNORMAL LOW (ref 96–112)
Creatinine, Ser: 1.11 mg/dL — ABNORMAL HIGH (ref 0.50–1.10)
GFR calc non Af Amer: 47 mL/min — ABNORMAL LOW (ref >90)
GFR, EST AFRICAN AMERICAN: 54 mL/min — AB (ref >90)
Glucose, Bld: 149 mg/dL — ABNORMAL HIGH (ref 70–99)
Potassium: 3.9 mEq/L (ref 3.7–5.3)
SODIUM: 136 meq/L — AB (ref 137–147)

## 2013-12-05 LAB — GLUCOSE, CAPILLARY
GLUCOSE-CAPILLARY: 181 mg/dL — AB (ref 70–99)
GLUCOSE-CAPILLARY: 198 mg/dL — AB (ref 70–99)
GLUCOSE-CAPILLARY: 174 mg/dL — AB (ref 70–99)
Glucose-Capillary: 152 mg/dL — ABNORMAL HIGH (ref 70–99)

## 2013-12-05 LAB — HEMOGLOBIN A1C
Hgb A1c MFr Bld: 6.1 % — ABNORMAL HIGH (ref <5.7)
Mean Plasma Glucose: 128 mg/dL — ABNORMAL HIGH (ref <117)

## 2013-12-05 MED ORDER — ALBUTEROL SULFATE (2.5 MG/3ML) 0.083% IN NEBU: 2.5000 mg | INHALATION_SOLUTION | Freq: Four times a day (QID) | RESPIRATORY_TRACT | Status: DC

## 2013-12-05 MED ORDER — METHYLPREDNISOLONE SODIUM SUCC 125 MG IJ SOLR
60.0000 mg | Freq: Two times a day (BID) | INTRAMUSCULAR | Status: DC
  Administered 2013-12-05 – 2013-12-06 (×2): 60 mg via INTRAVENOUS
  Filled 2013-12-05 (×2): qty 0.96

## 2013-12-05 MED ORDER — VANCOMYCIN HCL IN DEXTROSE 750-5 MG/150ML-% IV SOLN
750.0000 mg | INTRAVENOUS | Status: DC
  Administered 2013-12-06: 750 mg via INTRAVENOUS
  Filled 2013-12-05: qty 150

## 2013-12-05 MED ORDER — LEVOFLOXACIN 250 MG PO TABS
250.0000 mg | ORAL_TABLET | Freq: Every day | ORAL | Status: DC
  Administered 2013-12-06: 250 mg via ORAL
  Filled 2013-12-05: qty 1

## 2013-12-05 MED ORDER — LEVOFLOXACIN 500 MG PO TABS
500.0000 mg | ORAL_TABLET | Freq: Every day | ORAL | Status: AC
  Administered 2013-12-05: 500 mg via ORAL
  Filled 2013-12-05: qty 1

## 2013-12-05 MED ORDER — LEVOFLOXACIN 500 MG PO TABS: 500.0000 mg | ORAL_TABLET | Freq: Every day | ORAL | Status: DC

## 2013-12-05 MED ORDER — VANCOMYCIN HCL IN DEXTROSE 1-5 GM/200ML-% IV SOLN
1000.0000 mg | INTRAVENOUS | Status: AC
  Administered 2013-12-05: 1000 mg via INTRAVENOUS
  Filled 2013-12-05: qty 200

## 2013-12-05 NOTE — Plan of Care (Signed)
Problem: Phase I Progression Outcomes Goal: O2 sats > or equal 90% or at baseline Outcome: Completed/Met Date Met:  12/05/13 Goal: Dyspnea controlled at rest Outcome: Progressing Goal: Pain controlled Outcome: Completed/Met Date Met:  12/05/13 Goal: Tolerating diet Outcome: Completed/Met Date Met:  12/05/13

## 2013-12-05 NOTE — Evaluation (Signed)
Physical Therapy Evaluation Patient Details Name: Ashlee [name] MRN: [id number] DOB: [birth year] Today's Date: 12/05/2013   History of Present Illness  78 yo female admitted with COPD exac. hx of COPD, HTN, polyneuropathy, osteoporosis, Sz d/o.   Clinical Impression  On eval, pt required Min assist for mobility-able to ambulate ~100 feet with walker and O2. Demonstrates general weakness, decreased activity tolerance and impaired gait and balance. Daughters present-state plan is for pt to return home with family assisting. Recommend HHPT.     Follow Up Recommendations Home health PT;Supervision - Intermittent    Equipment Recommendations  None recommended by PT    Recommendations for Other Services       Precautions / Restrictions Precautions Precautions: Fall Precaution Comments: monitor vitals Restrictions Weight Bearing Restrictions: No      Mobility  Bed Mobility Overal bed mobility: Needs Assistance Bed Mobility: Supine to Sit;Sit to Supine     Supine to sit: Min guard Sit to supine: Min guard      Transfers Overall transfer level: Needs assistance Equipment used: Rolling walker (2 wheeled) Transfers: Sit to/from Stand Sit to Stand: Min assist         General transfer comment: assist to rise, stabilize, control descent. Increased assist to rise from lower surface.  Ambulation/Gait Ambulation/Gait assistance: Min assist Ambulation Distance (Feet): 100 Feet Assistive device: Rolling walker (2 wheeled) Gait Pattern/deviations: Decreased stride length;Trunk flexed     General Gait Details: intermittent assist to stabilize. VCS safety, distance from walker. Remained on  O2-94% 2L  Stairs            Wheelchair Mobility    Modified Rankin (Stroke Patients Only)       Balance Overall balance assessment: Needs assistance         Standing balance support: Bilateral upper extremity supported;During functional activity Standing balance-Leahy  Scale: Poor                               Pertinent Vitals/Pain Pain Assessment: No/denies pain    Home Living Family/patient expects to be discharged to:: Private residence Living Arrangements: Children   Type of Home: House Home Access: Stairs to enter Entrance Stairs-Rails: Right Entrance Stairs-Number of Steps: 4 Home Layout: Two level Home Equipment: Walker - 4 wheels;Other (comment) (oxygen)      Prior Function Level of Independence: Independent with assistive device(s)               Hand Dominance        Extremity/Trunk Assessment   Upper Extremity Assessment: Generalized weakness           Lower Extremity Assessment: Generalized weakness      Cervical / Trunk Assessment: Kyphotic  Communication   Communication: No difficulties  Cognition Arousal/Alertness: Awake/alert Behavior During Therapy: WFL for tasks assessed/performed Overall Cognitive Status: Within Functional Limits for tasks assessed                      General Comments      Exercises        Assessment/Plan    PT Assessment Patient needs continued PT services  PT Diagnosis Difficulty walking;Generalized weakness   PT Problem List Decreased strength;Decreased activity tolerance;Decreased balance;Decreased mobility  PT Treatment Interventions DME instruction;Gait training;Functional mobility training;Therapeutic activities;Patient/family education;Balance training;Therapeutic exercise   PT Goals (Current goals can be found in the Care Plan section) Acute Rehab PT Goals Patient Stated Goal: home  soon PT Goal Formulation: With patient/family Time For Goal Achievement: 12/19/13 Potential to Achieve Goals: Good    Frequency Min 3X/week   Barriers to discharge        Co-evaluation               End of Session Equipment Utilized During Treatment: Gait belt;Oxygen Activity Tolerance: Patient tolerated treatment well Patient left: in bed;with call  bell/phone within reach;with bed alarm set;with family/visitor present           Time: 7616-0737 PT Time Calculation (min) (ACUTE ONLY): 25 min   Charges:   PT Evaluation $Initial PT Evaluation Tier I: 1 Procedure PT Treatments $Gait Training: 8-22 mins $Therapeutic Activity: 8-22 mins   PT G Codes:          Weston Anna, MPT Pager: [phone]

## 2013-12-05 NOTE — Progress Notes (Signed)
Nutrition Brief Note  Patient identified on the Malnutrition Screening Tool (MST) Report  Pt with 7-8 lb weight loss over the last 6 months, insignificant for time frame. Pt was asleep during RD visit. Per nurse tech, pt was very hungry when admitted to floor and has eaten well this morning.   Wt Readings from Last 15 Encounters:  12/04/13 122 lb 1.6 oz (55.384 kg)  11/26/13 128 lb 12.8 oz (58.423 kg)  09/06/13 130 lb (58.968 kg)  07/11/13 131 lb (59.421 kg)  06/29/13 130 lb 1.9 oz (59.022 kg)  06/27/13 129 lb 12.8 oz (58.877 kg)  10/26/12 139 lb 6.4 oz (63.231 kg)  09/01/12 136 lb (61.689 kg)  08/24/12 135 lb 9.6 oz (61.508 kg)  06/09/12 136 lb 12.8 oz (62.052 kg)  11/24/11 147 lb 6.4 oz (66.86 kg)  06/25/11 149 lb (67.586 kg)  06/14/11 145 lb (65.772 kg)  06/08/11 149 lb 9.6 oz (67.858 kg)  12/01/10 146 lb 3.2 oz (66.316 kg)    Body mass index is 20.32 kg/(m^2). Patient meets criteria for normal range based on current BMI.   Current diet order is regular, patient is consuming approximately 75% of meals at this time. Labs and medications reviewed.   No nutrition interventions warranted at this time. If nutrition issues (decreased PO intake) arise, please consult RD.   Clayton Bibles, MS, RD, LDN Pager: [phone] After Hours Pager: [phone]
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
## 2013-12-05 NOTE — Progress Notes (Signed)
[facility] PROGRESS NOTE  Ashlee [name]:[id number] DOB: 1935/05/07 DOA: 12/04/2013 PCP: Marijean Bravo, MD  Assessment/Plan: 1-Acute COPD exacerbation;  Taper solumedrol, change to BID.  Continue with Xopenex, Levaquin, dulera, spiriva. Influenza panel negative.   2-Current smoker; counseling provide.   3-hyperglycemia; HbA1c at 6.1. Diet modification.   4-HTN; continue with Norvasc.   5-Hyperlipidemia; continue with statins.    Code Status: DNR Family Communication: Care discussed with daughters Disposition Plan: home in 1 or 2 days.    Consultants:  none  Procedures:  none  Antibiotics:  Levaquin 11-25  HPI/Subjective: Breathing better, use 2- 3 L oxygen at bedtime  Objective: Filed Vitals:   12/05/13 0646  BP: 126/68  Pulse: 102  Temp: 98.7 F (37.1 C)  Resp: 20    Intake/Output Summary (Last 24 hours) at 12/05/13 1323 Last data filed at 12/05/13 0900  Gross per 24 hour  Intake 1428.33 ml  Output    350 ml  Net 1078.33 ml   Filed Weights   12/04/13 1826  Weight: 55.384 kg (122 lb 1.6 oz)    Exam:   General:  Alert in no distress.   Cardiovascular: S 1, S 2 RRR  Respiratory: no wheezing, mild crackles.   Abdomen: BS present, soft, nt  Musculoskeletal: no edema.   Data Reviewed: Basic Metabolic Panel:  Recent Labs Lab 12/04/13 1425 12/05/13 0410  NA 136* 136*  K 3.5* 3.9  CL 91* 93*  CO2 29 28  GLUCOSE 180* 149*  BUN 13 16  CREATININE 0.99 1.11*  CALCIUM 10.3 10.3   Liver Function Tests:  Recent Labs Lab 12/04/13 1425  AST 22  ALT 12  ALKPHOS 118*  BILITOT 0.5  PROT 8.0  ALBUMIN 3.7   No results for input(s): LIPASE, AMYLASE in the last 168 hours. No results for input(s): AMMONIA in the last 168 hours. CBC:  Recent Labs Lab 12/04/13 1425  WBC 9.0  NEUTROABS 5.7  HGB 16.4*  HCT 47.3*  MCV 94.6  PLT 234   Cardiac Enzymes:  Recent Labs Lab 12/04/13 1425  TROPONINI <0.30   BNP (last 3  results)  Recent Labs  12/04/13 1426  PROBNP 368.9   CBG:  Recent Labs Lab 12/04/13 2159 12/05/13 0738 12/05/13 1145  GLUCAP 233* 152* 181*    Recent Results (from the past 240 hour(s))  Culture, blood (routine x 2)     Status: None (Preliminary result)   Collection Time: 12/04/13  2:26 PM  Result Value Ref Range Status   Specimen Description BLOOD RIGHT FOREARM  Final   Special Requests BOTTLES DRAWN AEROBIC AND ANAEROBIC 5CC  Final   Culture  Setup Time   Final    12/04/2013 17:08 Performed at [facility]    Culture   Final           BLOOD CULTURE RECEIVED NO GROWTH TO DATE CULTURE WILL BE HELD FOR 5 DAYS BEFORE ISSUING A FINAL NEGATIVE REPORT Performed at [facility]    Report Status PENDING  Incomplete  Culture, blood (routine x 2)     Status: None (Preliminary result)   Collection Time: 12/04/13  2:26 PM  Result Value Ref Range Status   Specimen Description BLOOD LEFT FOREARM  Final   Special Requests BOTTLES DRAWN AEROBIC AND ANAEROBIC 5CC  Final   Culture  Setup Time   Final    12/04/2013 17:08 Performed at [place]   Final  BLOOD CULTURE RECEIVED NO GROWTH TO DATE CULTURE WILL BE HELD FOR 5 DAYS BEFORE ISSUING A FINAL NEGATIVE REPORT Performed at [facility]    Report Status PENDING  Incomplete     Studies: Dg Chest 2 View  12/04/2013   CLINICAL DATA:  Shortness of breath and productive cough for 1 week.  EXAM: CHEST  2 VIEW  COMPARISON:  10/26/2012.  FINDINGS: Trachea is midline. Heart size stable. Mild scarring at the lung bases. Lungs appear hyperinflated but otherwise clear. No pleural fluid. Osteopenia.  IMPRESSION: Hyperinflation without acute finding.   Electronically Signed   By: Lorin Picket M.D.   On: 12/04/2013 15:57    Scheduled Meds: . ALPRAZolam  0.5 mg Oral BID WC  . ALPRAZolam  1 mg Oral QHS  . amLODipine  5 mg Oral Daily  . aspirin  325 mg Oral Daily  . calcium-vitamin D   1 tablet Oral BID WC  . enoxaparin (LOVENOX) injection  40 mg Subcutaneous Q24H  . guaiFENesin  600 mg Oral BID  . hydrochlorothiazide  12.5 mg Oral Daily  . insulin aspart  0-15 Units Subcutaneous TID WC  . insulin aspart  0-5 Units Subcutaneous QHS  . levalbuterol  0.63 mg Nebulization TID  . levofloxacin  500 mg Oral Daily  . methylPREDNISolone (SOLU-MEDROL) injection  60 mg Intravenous Q12H  . mometasone-formoterol  2 puff Inhalation BID  . pantoprazole  40 mg Oral Daily  . pravastatin  80 mg Oral QPC supper  . sodium chloride  3 mL Intravenous Q12H  . tiotropium  18 mcg Inhalation Daily  . vitamin B-12  100 mcg Oral Daily   Continuous Infusions:   Principal Problem:   COPD exacerbation Active Problems:   Hyperlipidemia   Folate-deficiency anemia   SMOKER   Essential hypertension   copd Gold C   ACID REFLUX DISEASE   Hyponatremia   Hypokalemia   Hyperglycemia    Time spent: 35 minutes.     Niel Hummer A  [facility] Pager [phone]. If 7PM-7AM, please contact night-coverage at [email or web address], password [facility] 12/05/2013, 1:23 PM  LOS: 1 day

## 2013-12-05 NOTE — Progress Notes (Signed)
CRITICAL VALUE ALERT  Critical value received: GRAM POSITIVE COCCI IN CLUSTERS   Date of notification:  12/05/2013  Time of notification:  2201  Critical value read back:Yes.    Nurse who received alert:  J.Kaipo Ardis  MD notified (1st page):  K.Schorr  Time of first page:  2202  MD notified (2nd page):  Time of second page:  Responding MD:  K.Schorr  Time MD responded:  2215

## 2013-12-06 LAB — GLUCOSE, CAPILLARY
GLUCOSE-CAPILLARY: 132 mg/dL — AB (ref 70–99)
Glucose-Capillary: 143 mg/dL — ABNORMAL HIGH (ref 70–99)
Glucose-Capillary: 139 mg/dL — ABNORMAL HIGH (ref 70–99)

## 2013-12-06 MED ORDER — PREDNISONE 50 MG PO TABS
60.0000 mg | ORAL_TABLET | Freq: Every day | ORAL | Status: DC
  Administered 2013-12-07: 60 mg via ORAL
  Filled 2013-12-06: qty 1

## 2013-12-06 NOTE — Plan of Care (Signed)
Problem: Phase I Progression Outcomes Goal: Dyspnea controlled at rest Outcome: Completed/Met Date Met:  12/06/13 Goal: Hemodynamically stable Outcome: Completed/Met Date Met:  12/06/13  Problem: Phase II Progression Outcomes Goal: Pain controlled on oral analgesia Outcome: Completed/Met Date Met:  12/06/13

## 2013-12-06 NOTE — Progress Notes (Signed)
[facility] PROGRESS NOTE  TULA SCHRYVER WRU:[id number] DOB: 1935/01/12 DOA: 12/04/2013 PCP: Marijean Bravo, MD  Assessment/Plan: 1-Acute COPD exacerbation;  Change solumedro to prednisone.  Continue with Xopenex, Levaquin, dulera, spiriva. Influenza panel negative.   2-Current smoker; counseling provide.   3-hyperglycemia; HbA1c at 6.1. Diet modification.   4-HTN; continue with Norvasc. Hold HCTZ SBP soft.   5-Hyperlipidemia; continue with statins.   6- Gram positive cocci cluster 1 blood culture of 2.  Follow culture report, continue with vanc for now.   Code Status: DNR Family Communication: Care discussed with daughters Disposition Plan: home in 1 or 2 days.    Consultants:  none  Procedures:  none  Antibiotics:  Levaquin 11-25  HPI/Subjective: Feeling better, breathing better.   Objective: Filed Vitals:   12/06/13 1349  BP: 118/51  Pulse: 104  Temp: 98 F (36.7 C)  Resp: 18    Intake/Output Summary (Last 24 hours) at 12/06/13 1529 Last data filed at 12/06/13 1255  Gross per 24 hour  Intake 830.83 ml  Output    950 ml  Net -119.17 ml   Filed Weights   12/04/13 1826 12/06/13 0557  Weight: 55.384 kg (122 lb 1.6 oz) 58.8 kg (129 lb 10.1 oz)    Exam:   General:  Alert in no distress.   Cardiovascular: S 1, S 2 RRR  Respiratory: no wheezing, mild crackles.   Abdomen: BS present, soft, nt  Musculoskeletal: no edema.   Data Reviewed: Basic Metabolic Panel:  Recent Labs Lab 12/04/13 1425 12/05/13 0410  NA 136* 136*  K 3.5* 3.9  CL 91* 93*  CO2 29 28  GLUCOSE 180* 149*  BUN 13 16  CREATININE 0.99 1.11*  CALCIUM 10.3 10.3   Liver Function Tests:  Recent Labs Lab 12/04/13 1425  AST 22  ALT 12  ALKPHOS 118*  BILITOT 0.5  PROT 8.0  ALBUMIN 3.7   No results for input(s): LIPASE, AMYLASE in the last 168 hours. No results for input(s): AMMONIA in the last 168 hours. CBC:  Recent Labs Lab 12/04/13 1425  WBC 9.0   NEUTROABS 5.7  HGB 16.4*  HCT 47.3*  MCV 94.6  PLT 234   Cardiac Enzymes:  Recent Labs Lab 12/04/13 1425  TROPONINI <0.30   BNP (last 3 results)  Recent Labs  12/04/13 1426  PROBNP 368.9   CBG:  Recent Labs Lab 12/05/13 1145 12/05/13 1706 12/05/13 2143 12/06/13 0731 12/06/13 1140  GLUCAP 181* 198* 174* 143* 132*    Recent Results (from the past 240 hour(s))  Culture, blood (routine x 2)     Status: None (Preliminary result)   Collection Time: 12/04/13  2:26 PM  Result Value Ref Range Status   Specimen Description BLOOD RIGHT FOREARM  Final   Special Requests BOTTLES DRAWN AEROBIC AND ANAEROBIC 5CC  Final   Culture  Setup Time   Final    12/04/2013 17:08 Performed at [facility]    Culture   Final    Magnet IN CLUSTERS Note: Gram Stain Report Called to,Read Back By and Verified With: JUNIOR RIMANDO ON 12/05/2013 AT 10:00P BY WILEJ Performed at [facility]    Report Status PENDING  Incomplete  Culture, blood (routine x 2)     Status: None (Preliminary result)   Collection Time: 12/04/13  2:26 PM  Result Value Ref Range Status   Specimen Description BLOOD LEFT FOREARM  Final   Special Requests BOTTLES DRAWN AEROBIC AND ANAEROBIC 5CC  Final   Culture  Setup Time   Final    12/04/2013 17:08 Performed at [facility]    Culture   Final           BLOOD CULTURE RECEIVED NO GROWTH TO DATE CULTURE WILL BE HELD FOR 5 DAYS BEFORE ISSUING A FINAL NEGATIVE REPORT Performed at [facility]    Report Status PENDING  Incomplete     Studies: Dg Chest 2 View  12/04/2013   CLINICAL DATA:  Shortness of breath and productive cough for 1 week.  EXAM: CHEST  2 VIEW  COMPARISON:  10/26/2012.  FINDINGS: Trachea is midline. Heart size stable. Mild scarring at the lung bases. Lungs appear hyperinflated but otherwise clear. No pleural fluid. Osteopenia.  IMPRESSION: Hyperinflation without acute finding.   Electronically Signed    By: Lorin Picket M.D.   On: 12/04/2013 15:57    Scheduled Meds: . ALPRAZolam  0.5 mg Oral BID WC  . ALPRAZolam  1 mg Oral QHS  . amLODipine  5 mg Oral Daily  . aspirin  325 mg Oral Daily  . calcium-vitamin D  1 tablet Oral BID WC  . enoxaparin (LOVENOX) injection  40 mg Subcutaneous Q24H  . guaiFENesin  600 mg Oral BID  . hydrochlorothiazide  12.5 mg Oral Daily  . insulin aspart  0-15 Units Subcutaneous TID WC  . insulin aspart  0-5 Units Subcutaneous QHS  . levalbuterol  0.63 mg Nebulization TID  . levofloxacin  250 mg Oral QHS  . methylPREDNISolone (SOLU-MEDROL) injection  60 mg Intravenous Q12H  . mometasone-formoterol  2 puff Inhalation BID  . pantoprazole  40 mg Oral Daily  . pravastatin  80 mg Oral QPC supper  . sodium chloride  3 mL Intravenous Q12H  . tiotropium  18 mcg Inhalation Daily  . vancomycin  750 mg Intravenous Q24H  . vitamin B-12  100 mcg Oral Daily   Continuous Infusions:   Principal Problem:   COPD exacerbation Active Problems:   Hyperlipidemia   Folate-deficiency anemia   SMOKER   Essential hypertension   copd Gold C   ACID REFLUX DISEASE   Hyponatremia   Hypokalemia   Hyperglycemia    Time spent: 30 minutes.     Niel Hummer A  [facility] Pager [phone]. If 7PM-7AM, please contact night-coverage at [email or web address], password [facility] 12/06/2013, 3:29 PM  LOS: 2 days

## 2013-12-07 LAB — GLUCOSE, CAPILLARY
Glucose-Capillary: 147 mg/dL — ABNORMAL HIGH (ref 70–99)
Glucose-Capillary: 103 mg/dL — ABNORMAL HIGH (ref 70–99)

## 2013-12-07 LAB — CULTURE, BLOOD (ROUTINE X 2)

## 2013-12-07 MED ORDER — LEVOFLOXACIN 250 MG PO TABS: 250.0000 mg | ORAL_TABLET | Freq: Every day | ORAL | Status: DC

## 2013-12-07 MED ORDER — GUAIFENESIN ER 600 MG PO TB12: 600.0000 mg | ORAL_TABLET | Freq: Two times a day (BID) | ORAL | Status: DC

## 2013-12-07 MED ORDER — PREDNISONE 20 MG PO TABS: ORAL_TABLET | ORAL | Status: DC

## 2013-12-07 NOTE — Discharge Summary (Signed)
Physician Discharge Summary  MARKAN CAZAREZ IPJ:[id number] DOB: 1935/08/28 DOA: 12/04/2013  PCP: Marijean Bravo, MD  Admit date: 12/04/2013 Discharge date: 12/07/2013  Time spent: 35 minutes  Recommendations for Outpatient Follow-up:  1. Need continue with smoking cessation counseling.  2. b-met to follow renal function. 3. Need repeat HbA1c.    Discharge Diagnoses:    COPD exacerbation   Hyperlipidemia   Folate-deficiency anemia   SMOKER   Essential hypertension   copd Gold C   ACID REFLUX DISEASE   Hyponatremia   Hypokalemia   Hyperglycemia   Discharge Condition: Stable.   Diet recommendation: carb modified diet.   Filed Weights   12/04/13 1826 12/06/13 0557 12/07/13 0413  Weight: 55.384 kg (122 lb 1.6 oz) 58.8 kg (129 lb 10.1 oz) 58.8 kg (129 lb 10.1 oz)    History of present illness:  Ashlee [name] is an 78 y.o. female with a PMH of COPD, chronic respiratory failure on nocturnal oxygen, managed with Spiriva, Advair, and PRN Combivent/Albuterol, who presents with a 1 week history of worsening dyspnea, cough productive of yellow sputum. Has been fatigued x 4 weeks. No prior hospitalizations for breathing problems. The patient denies that she used her Albuterol inhaler, despite having more shortness of breath. No associated fever or chills. Upon initial evaluation in the ED, chest x-ray was unremarkable except for hyperinflation. Vital signs showed hypoxia with oxygen saturations in the upper 87% on room air, heart rate 112-135, respiratory rate 18-24, afebrile. Labs were notable for glucose 180, potassium 3.6, sodium 136, WBC of 9. Troponin was negative. She is being admitted to a telemetry floor given her tachycardia for treatment of acute COPD exacerbation.  Hospital Course:  1-Acute COPD exacerbation;  Change solumedro to prednisone.  Continue with Xopenex, Levaquin, dulera, spiriva. Influenza panel negative.  Improved. Plan to discharge today.   2-Current smoker;  counseling provide.   3-hyperglycemia; HbA1c at 6.1. Diet modification.   4-HTN; continue with Norvasc. Hold HCTZ SBP soft.   5-Hyperlipidemia; continue with statins.   6- Gram positive cocci cluster 1 blood culture of 2.  Staph coagulase negative, likely contaminant.    Procedures:  none  Consultations:  none  Discharge Exam: Filed Vitals:   12/07/13 0413  BP: 130/48  Pulse: 99  Temp: 98 F (36.7 C)  Resp: 18    General: Alert in no distress.  Cardiovascular: S 1, S 2 RRR Respiratory: CTA  Discharge Instructions You were cared for by a hospitalist during your hospital stay. If you have any questions about your discharge medications or the care you received while you were in the hospital after you are discharged, you can call the unit and asked to speak with the hospitalist on call if the hospitalist that took care of you is not available. Once you are discharged, your primary care physician will handle any further medical issues. Please note that NO REFILLS for any discharge medications will be authorized once you are discharged, as it is imperative that you return to your primary care physician (or establish a relationship with a primary care physician if you do not have one) for your aftercare needs so that they can reassess your need for medications and monitor your lab values.  Discharge Instructions    Diet Carb Modified    Complete by:  As directed      Increase activity slowly    Complete by:  As directed           Current Discharge  Medication List    START taking these medications   Details  guaiFENesin (MUCINEX) 600 MG 12 hr tablet Take 1 tablet (600 mg total) by mouth 2 (two) times daily. Qty: 30 tablet, Refills: 0    levofloxacin (LEVAQUIN) 250 MG tablet Take 1 tablet (250 mg total) by mouth at bedtime. Qty: 2 tablet, Refills: 0    predniSONE (DELTASONE) 20 MG tablet Take 3 tablets for 3 days then 2 tablet for 3 days then 1 tablet for one day then  stop Qty: 16 tablet, Refills: 0      CONTINUE these medications which have NOT CHANGED   Details  albuterol (PROVENTIL HFA;VENTOLIN HFA) 108 (90 BASE) MCG/ACT inhaler Inhale 1-2 puffs into the lungs every 6 (six) hours as needed for wheezing or shortness of breath.    ALPRAZolam (XANAX) 1 MG tablet Take 0.5-1 mg by mouth 3 (three) times daily. 0.5 mg in the morning and at noon, 1 mg at bedtime    amLODipine (NORVASC) 5 MG tablet Take 5 mg by mouth daily.     aspirin 325 MG tablet Take 325 mg by mouth daily.     bisacodyl (EQ WOMANS LAXATIVE) 5 MG EC tablet Take 5 mg by mouth daily as needed for constipation.     Calcium Carbonate-Vitamin D (CALTRATE 600+D) 600-400 MG-UNIT per tablet Take 1 tablet by mouth 2 (two) times daily.      [facility] NASAL SPRAY 0.05 % nasal spray Place 1 spray into both nostrils as needed for congestion.     Cyanocobalamin (VITAMIN B-12 PO) Take 1 tablet by mouth daily.    Fluticasone-Salmeterol (ADVAIR DISKUS) 250-50 MCG/DOSE AEPB INHALE 1 PUFF INTO THE LUNGS 2 (TWO) TIMES DAILY. Qty: 60 each, Refills: 6    omeprazole (PRILOSEC) 40 MG capsule Take 40 mg by mouth daily as needed (heart burn).     polyethylene glycol powder (MIRALAX) powder Take 17 g by mouth daily as needed. constipation    pravastatin (PRAVACHOL) 80 MG tablet Take 80 mg by mouth daily.     Tiotropium Bromide Monohydrate (SPIRIVA RESPIMAT) 2.5 MCG/ACT AERS Inhale 2 puffs into the lungs daily. Qty: 4 g, Refills: 6      STOP taking these medications     hydrochlorothiazide (MICROZIDE) 12.5 MG capsule        Allergies  Allergen Reactions  . Alendronate Sodium     REACTION: indigestion  . Donepezil Hydrochloride     REACTION: SOB, nausea  . Lisinopril     REACTION: elevated creatinine   Follow-up Information    Follow up with TALBOT, DAVID C, MD In 1 week.   Specialty:  Internal Medicine   Contact information:   [place] [address] [phone]         The results of significant diagnostics from this hospitalization (including imaging, microbiology, ancillary and laboratory) are listed below for reference.    Significant Diagnostic Studies: Dg Chest 2 View  12/04/2013   CLINICAL DATA:  Shortness of breath and productive cough for 1 week.  EXAM: CHEST  2 VIEW  COMPARISON:  10/26/2012.  FINDINGS: Trachea is midline. Heart size stable. Mild scarring at the lung bases. Lungs appear hyperinflated but otherwise clear. No pleural fluid. Osteopenia.  IMPRESSION: Hyperinflation without acute finding.   Electronically Signed   By: Lorin Picket M.D.   On: 12/04/2013 15:57    Microbiology: Recent Results (from the past 240 hour(s))  Culture, blood (routine x 2)  Status: None   Collection Time: 12/04/13  2:26 PM  Result Value Ref Range Status   Specimen Description BLOOD RIGHT FOREARM  Final   Special Requests BOTTLES DRAWN AEROBIC AND ANAEROBIC 5CC  Final   Culture  Setup Time   Final    12/04/2013 17:08 Performed at [facility]    Culture   Final    STAPHYLOCOCCUS SPECIES (COAGULASE NEGATIVE) Note: THE SIGNIFICANCE OF ISOLATING THIS ORGANISM FROM A SINGLE SET OF BLOOD CULTURES WHEN MULTIPLE SETS ARE DRAWN IS UNCERTAIN. PLEASE NOTIFY THE MICROBIOLOGY DEPARTMENT WITHIN ONE WEEK IF SPECIATION AND SENSITIVITIES ARE REQUIRED. Note: Gram Stain Report Called to,Read Back By and Verified With: JUNIOR RIMANDO ON 12/05/2013 AT 10:00P BY WILEJ Performed at [facility]    Report Status 12/07/2013 FINAL  Final  Culture, blood (routine x 2)     Status: None (Preliminary result)   Collection Time: 12/04/13  2:26 PM  Result Value Ref Range Status   Specimen Description BLOOD LEFT FOREARM  Final   Special Requests BOTTLES DRAWN AEROBIC AND ANAEROBIC 5CC  Final   Culture  Setup Time   Final    12/04/2013 17:08 Performed at [facility]    Culture   Final           BLOOD CULTURE RECEIVED NO GROWTH TO DATE CULTURE WILL  BE HELD FOR 5 DAYS BEFORE ISSUING A FINAL NEGATIVE REPORT Performed at [facility]    Report Status PENDING  Incomplete     Labs: Basic Metabolic Panel:  Recent Labs Lab 12/04/13 1425 12/05/13 0410  NA 136* 136*  K 3.5* 3.9  CL 91* 93*  CO2 29 28  GLUCOSE 180* 149*  BUN 13 16  CREATININE 0.99 1.11*  CALCIUM 10.3 10.3   Liver Function Tests:  Recent Labs Lab 12/04/13 1425  AST 22  ALT 12  ALKPHOS 118*  BILITOT 0.5  PROT 8.0  ALBUMIN 3.7   No results for input(s): LIPASE, AMYLASE in the last 168 hours. No results for input(s): AMMONIA in the last 168 hours. CBC:  Recent Labs Lab 12/04/13 1425  WBC 9.0  NEUTROABS 5.7  HGB 16.4*  HCT 47.3*  MCV 94.6  PLT 234   Cardiac Enzymes:  Recent Labs Lab 12/04/13 1425  TROPONINI <0.30   BNP: BNP (last 3 results)  Recent Labs  12/04/13 1426  PROBNP 368.9   CBG:  Recent Labs Lab 12/06/13 0731 12/06/13 1140 12/06/13 1755 12/06/13 2254 12/07/13 0719  GLUCAP 143* 132* 139* 147* 103*       Signed:  Rasheema Truluck A  [facility] 12/07/2013, 10:47 AM

## 2013-12-10 LAB — CULTURE, BLOOD (ROUTINE X 2): CULTURE: NO GROWTH

## 2014-01-29 ENCOUNTER — Encounter: Payer: Self-pay | Admitting: Cardiology

## 2014-02-06 ENCOUNTER — Telehealth: Payer: Self-pay | Admitting: Critical Care Medicine

## 2014-02-06 MED ORDER — TIOTROPIUM BROMIDE MONOHYDRATE 18 MCG IN CAPS: 18.0000 ug | ORAL_CAPSULE | Freq: Every day | RESPIRATORY_TRACT | Status: DC

## 2014-02-06 NOTE — Telephone Encounter (Signed)
i am ok with change back to spiriva handihaler

## 2014-02-06 NOTE — Telephone Encounter (Signed)
Pt has been informed. Nothing further needed at this time.

## 2014-02-06 NOTE — Telephone Encounter (Signed)
Called and lmomtcb x 1 spiriva HH has been sent to her pharmacy and med list has been updated.

## 2014-02-06 NOTE — Telephone Encounter (Signed)
Spoke with pt, states that her spiriva respimat is not working as well for her and is wishing to switch back to spiriva handihaler.  [facility] uses [facility] on [address].    Dr. Joya Gaskins are you ok with this medication switch?  Thanks!

## 2014-02-08 ENCOUNTER — Telehealth: Payer: Self-pay | Admitting: Critical Care Medicine

## 2014-02-08 MED ORDER — ALBUTEROL SULFATE HFA 108 (90 BASE) MCG/ACT IN AERS: 1.0000 | INHALATION_SPRAY | Freq: Four times a day (QID) | RESPIRATORY_TRACT | Status: DC | PRN

## 2014-02-08 MED ORDER — FLUTICASONE-SALMETEROL 250-50 MCG/DOSE IN AEPB: INHALATION_SPRAY | RESPIRATORY_TRACT | Status: DC

## 2014-02-08 NOTE — Telephone Encounter (Signed)
Spoke with pt, states she needs proair and advair sent to pharmacy.  This has been done.  Nothing further needed.

## 2014-03-06 ENCOUNTER — Telehealth: Payer: Self-pay | Admitting: Critical Care Medicine

## 2014-03-06 MED ORDER — BUDESONIDE-FORMOTEROL FUMARATE 160-4.5 MCG/ACT IN AERO: 2.0000 | INHALATION_SPRAY | Freq: Two times a day (BID) | RESPIRATORY_TRACT | Status: DC

## 2014-03-06 NOTE — Telephone Encounter (Signed)
Spoke with pt, she is ok with symbicort being sent in to pharmacy.  This has been sent.  Nothing further needed.

## 2014-03-06 NOTE — Telephone Encounter (Signed)
Sorry, i do not know her formulary.   Try symbicort two puff bid 160 or breo 100 one puff daily

## 2014-03-06 NOTE — Telephone Encounter (Signed)
Called and spoke with pt and she stated that her insurance will no longer cover her advair.  She stated that they did not tell her what else was covered.  Will send message to PW to see what other medication can be sent in for the pt.  Thanks  . Allergies  Allergen Reactions  . Alendronate Sodium     REACTION: indigestion  . Donepezil Hydrochloride     REACTION: SOB, nausea  . Lisinopril     REACTION: elevated creatinine    Current Outpatient Prescriptions on File Prior to Visit  Medication Sig Dispense Refill  . albuterol (PROVENTIL HFA;VENTOLIN HFA) 108 (90 BASE) MCG/ACT inhaler Inhale 1-2 puffs into the lungs every 6 (six) hours as needed for wheezing or shortness of breath. 1 Inhaler 6  . ALPRAZolam (XANAX) 1 MG tablet Take 0.5-1 mg by mouth 3 (three) times daily. 0.5 mg in the morning and at noon, 1 mg at bedtime    . amLODipine (NORVASC) 5 MG tablet Take 5 mg by mouth daily.     Marland Kitchen aspirin 325 MG tablet Take 325 mg by mouth daily.     . bisacodyl (EQ WOMANS LAXATIVE) 5 MG EC tablet Take 5 mg by mouth daily as needed for constipation.     . Calcium Carbonate-Vitamin D (CALTRATE 600+D) 600-400 MG-UNIT per tablet Take 1 tablet by mouth 2 (two) times daily.      . [facility] NASAL SPRAY 0.05 % nasal spray Place 1 spray into both nostrils as needed for congestion.     . Cyanocobalamin (VITAMIN B-12 PO) Take 1 tablet by mouth daily.    . Fluticasone-Salmeterol (ADVAIR DISKUS) 250-50 MCG/DOSE AEPB INHALE 1 PUFF INTO THE LUNGS 2 (TWO) TIMES DAILY. 60 each 6  . guaiFENesin (MUCINEX) 600 MG 12 hr tablet Take 1 tablet (600 mg total) by mouth 2 (two) times daily. 30 tablet 0  . levofloxacin (LEVAQUIN) 250 MG tablet Take 1 tablet (250 mg total) by mouth at bedtime. 2 tablet 0  . omeprazole (PRILOSEC) 40 MG capsule Take 40 mg by mouth daily as needed (heart burn).     . polyethylene glycol powder (MIRALAX) powder Take 17 g by mouth daily as needed. constipation    . pravastatin (PRAVACHOL) 80 MG tablet  Take 80 mg by mouth daily.     . predniSONE (DELTASONE) 20 MG tablet Take 3 tablets for 3 days then 2 tablet for 3 days then 1 tablet for one day then stop 16 tablet 0  . tiotropium (SPIRIVA) 18 MCG inhalation capsule Place 1 capsule (18 mcg total) into inhaler and inhale daily. 30 capsule 6   No current facility-administered medications on file prior to visit.

## 2014-04-10 ENCOUNTER — Ambulatory Visit (INDEPENDENT_AMBULATORY_CARE_PROVIDER_SITE_OTHER): Payer: Medicare Other | Admitting: Critical Care Medicine

## 2014-04-10 ENCOUNTER — Telehealth: Payer: Self-pay | Admitting: Critical Care Medicine

## 2014-04-10 ENCOUNTER — Encounter: Payer: Self-pay | Admitting: Critical Care Medicine

## 2014-04-10 VITALS — BP 140/88 | HR 89 | Temp 98.7°F | Ht 65.0 in | Wt <= 1120 oz

## 2014-04-10 DIAGNOSIS — J449 Chronic obstructive pulmonary disease, unspecified: Secondary | ICD-10-CM

## 2014-04-10 DIAGNOSIS — F17200 Nicotine dependence, unspecified, uncomplicated: Secondary | ICD-10-CM

## 2014-04-10 DIAGNOSIS — Z720 Tobacco use: Secondary | ICD-10-CM | POA: Diagnosis not present

## 2014-04-10 NOTE — Telephone Encounter (Signed)
Will forward to Dr. Wright as FYI. 

## 2014-04-10 NOTE — Assessment & Plan Note (Signed)
Down to 2 cigarettes per week. SOB and cough are seldom, at baseline.  Using symbicort, spiriva, albuterol inh once per day as needed.  At baseline during this visit, discussed pharmacological help to quit smoking but she wants to "do it on my own"

## 2014-04-10 NOTE — Patient Instructions (Signed)
-  Continue the Spiriva and the Symbicort as you are.  -Continue using the albuterol inhaler as needed.  -Follow up with your new primary care doctor, call us with his name so we can update your chart.  -Follow up in 4 months.

## 2014-04-10 NOTE — Progress Notes (Signed)
Subjective:    Patient ID: Ashlee [name], female    DOB: [birth year], 79 y.o.   MRN: [id number]  HPI   04/10/2014 Chief Complaint  Patient presents with  . Follow-up    Pt states she has a cough with wheezing. She is SOB and dizzy with activity. She denies any tightness, n/v/d, f/c/s. Pt states this has been going on since Jan.  Has had dizziness for 5 years, thinks it is side effect of her blood pressure medication, has vertigo. No falls. Uses Walker to stabilize gait. Will see a new PCP Dr. Dorene Grebe on June 7th.  Breathing is at baseline, has occasional dry cough. SOB very seldom.  Symbicort 2 puffs twice per day.  Spiriva once per day. Has dizziness with room spinning.   Uses albuterol inh once per day.  Smokes 2 cigarette per week for the past 1 yr. Does not feel ready to completely quit. Not interested in e-cigarettes. Has quit before for months but "something happens in the family and goes right back".  65 pack-year smoking history.   Review of Systems Constitutional:   No  weight loss, night sweats,  Fevers, chills, fatigue, lassitude.HEENT:   No headaches,  Difficulty swallowing,  Tooth/dental problems,  Sore throat,                No sneezing, itching, ear ache, nasal congestion, post nasal drip, has dizziness with the room spinning  CV:  No chest pain,  Orthopnea, PND, swelling in lower extremities, anasarca, dizziness, palpitations  GI  No heartburn, indigestion, abdominal pain, nausea, vomiting, diarrhea, change in bowel habits, loss of appetite  Resp: No shortness of breath with exertion or at rest.  No excess mucus, no productive cough,  No non-productive cough,  No coughing up of blood.  No change in color of mucus.  No wheezing.  No chest wall deformity  Skin: no rash or lesions.  GU: no dysuria, change in color of urine, no urgency or frequency.  No flank pain.  MS:  No joint pain or swelling.  No decreased range of motion.  No back pain.  Psych:  No change in  mood or affect. No depression or anxiety.  No memory loss.     Objective:   Physical Exam Filed Vitals:   04/10/14 1128  BP: 140/88  Pulse: 89  Temp: 98.7 F (37.1 C)  TempSrc: Oral  Height: 5\' 5"  (1.651 m)  Weight: 13 lb 9.6 oz (6.169 kg)  SpO2: 96%    Gen: Pleasant, well-nourished, in no distress,  Thin, normal affect  ENT: No lesions,  mouth clear,  oropharynx clear, no postnasal drip  Neck: No JVD, no TMG, no carotid bruits  Lungs: No use of accessory muscles, no dullness to percussion, distant BS  Cardiovascular: RRR, heart sounds normal, no murmur or gallops, no peripheral edema  Abdomen: soft and NT, no HSM,  BS normal  Musculoskeletal: No deformities, no cyanosis or clubbing  Neuro: alert, non focal  Skin: Warm, no lesions or rashes  No results found.       Assessment & Plan:   copd Gold C Down to 2 cigarettes per week. SOB and cough are seldom, at baseline.  Using symbicort, spiriva, albuterol inh once per day as needed.  At baseline during this visit, discussed pharmacological help to quit smoking but she wants to "do it on my own"    SMOKER Ongoing tobacco use 3-10 smoking counseling given     Updated  Medication List Outpatient Encounter Prescriptions as of 04/10/2014  Medication Sig  . albuterol (PROVENTIL HFA;VENTOLIN HFA) 108 (90 BASE) MCG/ACT inhaler Inhale 1-2 puffs into the lungs every 6 (six) hours as needed for wheezing or shortness of breath.  Marland Kitchen amLODipine (NORVASC) 5 MG tablet Take 5 mg by mouth daily.   . budesonide-formoterol (SYMBICORT) 160-4.5 MCG/ACT inhaler Inhale 2 puffs into the lungs 2 (two) times daily.  . Calcium Carbonate-Vitamin D (CALTRATE 600+D) 600-400 MG-UNIT per tablet Take 1 tablet by mouth 2 (two) times daily.    . Cyanocobalamin (VITAMIN B-12 PO) Take 1 tablet by mouth daily.  Marland Kitchen omeprazole (PRILOSEC) 40 MG capsule Take 40 mg by mouth daily as needed (heart burn).   . polyethylene glycol powder (MIRALAX) powder  Take 17 g by mouth daily as needed. constipation  . pravastatin (PRAVACHOL) 80 MG tablet Take 80 mg by mouth daily.   Marland Kitchen tiotropium (SPIRIVA) 18 MCG inhalation capsule Place 1 capsule (18 mcg total) into inhaler and inhale daily.  Marland Kitchen ALPRAZolam (XANAX) 1 MG tablet Take 0.5-1 mg by mouth 3 (three) times daily. 0.5 mg in the morning and at noon, 1 mg at bedtime  . bisacodyl (EQ WOMANS LAXATIVE) 5 MG EC tablet Take 5 mg by mouth daily as needed for constipation.   . [facility] NASAL SPRAY 0.05 % nasal spray Place 1 spray into both nostrils as needed for congestion.   Marland Kitchen guaiFENesin (MUCINEX) 600 MG 12 hr tablet Take 1 tablet (600 mg total) by mouth 2 (two) times daily. (Patient not taking: Reported on 04/10/2014)  . hydrochlorothiazide (MICROZIDE) 12.5 MG capsule Take 12.5 mg by mouth daily.  Marland Kitchen levofloxacin (LEVAQUIN) 250 MG tablet Take 1 tablet (250 mg total) by mouth at bedtime. (Patient not taking: Reported on 04/10/2014)  . [DISCONTINUED] aspirin 325 MG tablet Take 325 mg by mouth daily.   . [DISCONTINUED] predniSONE (DELTASONE) 20 MG tablet Take 3 tablets for 3 days then 2 tablet for 3 days then 1 tablet for one day then stop (Patient not taking: Reported on 04/10/2014)

## 2014-04-11 ENCOUNTER — Encounter: Payer: Self-pay | Admitting: Critical Care Medicine

## 2014-04-11 NOTE — Assessment & Plan Note (Signed)
Ongoing tobacco use 3-10 smoking counseling given

## 2014-05-09 ENCOUNTER — Telehealth: Payer: Self-pay | Admitting: Critical Care Medicine

## 2014-05-09 MED ORDER — ALBUTEROL SULFATE HFA 108 (90 BASE) MCG/ACT IN AERS: 1.0000 | INHALATION_SPRAY | Freq: Four times a day (QID) | RESPIRATORY_TRACT | Status: DC | PRN

## 2014-05-09 NOTE — Telephone Encounter (Signed)
Spoke with pt and advised that rx for Albuterol was sent to pharmacy.

## 2014-06-23 ENCOUNTER — Emergency Department (HOSPITAL_COMMUNITY): Payer: Medicare HMO

## 2014-06-23 ENCOUNTER — Encounter (HOSPITAL_COMMUNITY): Payer: Self-pay

## 2014-06-23 ENCOUNTER — Inpatient Hospital Stay (HOSPITAL_COMMUNITY)
Admission: EM | Admit: 2014-06-23 | Discharge: 2014-06-26 | DRG: 191 | Disposition: A | Discharge status: Home / Self Care | Payer: Medicare HMO | Attending: Internal Medicine | Admitting: Internal Medicine

## 2014-06-23 DIAGNOSIS — R918 Other nonspecific abnormal finding of lung field: Secondary | ICD-10-CM | POA: Insufficient documentation

## 2014-06-23 DIAGNOSIS — R627 Adult failure to thrive: Secondary | ICD-10-CM | POA: Diagnosis present

## 2014-06-23 DIAGNOSIS — R0602 Shortness of breath: Secondary | ICD-10-CM | POA: Diagnosis not present

## 2014-06-23 DIAGNOSIS — F17210 Nicotine dependence, cigarettes, uncomplicated: Secondary | ICD-10-CM | POA: Diagnosis present

## 2014-06-23 DIAGNOSIS — R0600 Dyspnea, unspecified: Secondary | ICD-10-CM

## 2014-06-23 DIAGNOSIS — G40909 Epilepsy, unspecified, not intractable, without status epilepticus: Secondary | ICD-10-CM | POA: Diagnosis present

## 2014-06-23 DIAGNOSIS — Z9981 Dependence on supplemental oxygen: Secondary | ICD-10-CM

## 2014-06-23 DIAGNOSIS — D539 Nutritional anemia, unspecified: Secondary | ICD-10-CM | POA: Diagnosis present

## 2014-06-23 DIAGNOSIS — E876 Hypokalemia: Secondary | ICD-10-CM | POA: Diagnosis present

## 2014-06-23 DIAGNOSIS — J441 Chronic obstructive pulmonary disease with (acute) exacerbation: Principal | ICD-10-CM | POA: Diagnosis present

## 2014-06-23 DIAGNOSIS — K5900 Constipation, unspecified: Secondary | ICD-10-CM | POA: Diagnosis present

## 2014-06-23 DIAGNOSIS — J9610 Chronic respiratory failure, unspecified whether with hypoxia or hypercapnia: Secondary | ICD-10-CM | POA: Diagnosis present

## 2014-06-23 DIAGNOSIS — G309 Alzheimer's disease, unspecified: Secondary | ICD-10-CM | POA: Diagnosis present

## 2014-06-23 DIAGNOSIS — G629 Polyneuropathy, unspecified: Secondary | ICD-10-CM | POA: Diagnosis present

## 2014-06-23 DIAGNOSIS — K2970 Gastritis, unspecified, without bleeding: Secondary | ICD-10-CM | POA: Diagnosis present

## 2014-06-23 DIAGNOSIS — R634 Abnormal weight loss: Secondary | ICD-10-CM

## 2014-06-23 DIAGNOSIS — K219 Gastro-esophageal reflux disease without esophagitis: Secondary | ICD-10-CM | POA: Diagnosis present

## 2014-06-23 DIAGNOSIS — Z79899 Other long term (current) drug therapy: Secondary | ICD-10-CM

## 2014-06-23 DIAGNOSIS — J9611 Chronic respiratory failure with hypoxia: Secondary | ICD-10-CM | POA: Diagnosis present

## 2014-06-23 DIAGNOSIS — Z66 Do not resuscitate: Secondary | ICD-10-CM | POA: Diagnosis present

## 2014-06-23 DIAGNOSIS — E785 Hyperlipidemia, unspecified: Secondary | ICD-10-CM | POA: Diagnosis present

## 2014-06-23 DIAGNOSIS — Z681 Body mass index (BMI) 19 or less, adult: Secondary | ICD-10-CM

## 2014-06-23 DIAGNOSIS — R410 Disorientation, unspecified: Secondary | ICD-10-CM

## 2014-06-23 DIAGNOSIS — F0280 Dementia in other diseases classified elsewhere without behavioral disturbance: Secondary | ICD-10-CM | POA: Diagnosis present

## 2014-06-23 DIAGNOSIS — Z86010 Personal history of colonic polyps: Secondary | ICD-10-CM

## 2014-06-23 DIAGNOSIS — M810 Age-related osteoporosis without current pathological fracture: Secondary | ICD-10-CM | POA: Diagnosis present

## 2014-06-23 DIAGNOSIS — R000 Tachycardia, unspecified: Secondary | ICD-10-CM | POA: Diagnosis present

## 2014-06-23 DIAGNOSIS — J45909 Unspecified asthma, uncomplicated: Secondary | ICD-10-CM | POA: Diagnosis present

## 2014-06-23 DIAGNOSIS — I10 Essential (primary) hypertension: Secondary | ICD-10-CM | POA: Diagnosis present

## 2014-06-23 DIAGNOSIS — Z8249 Family history of ischemic heart disease and other diseases of the circulatory system: Secondary | ICD-10-CM

## 2014-06-23 DIAGNOSIS — R911 Solitary pulmonary nodule: Secondary | ICD-10-CM

## 2014-06-23 DIAGNOSIS — Z720 Tobacco use: Secondary | ICD-10-CM | POA: Diagnosis present

## 2014-06-23 DIAGNOSIS — K3189 Other diseases of stomach and duodenum: Secondary | ICD-10-CM | POA: Diagnosis present

## 2014-06-23 DIAGNOSIS — E440 Moderate protein-calorie malnutrition: Secondary | ICD-10-CM | POA: Diagnosis present

## 2014-06-23 LAB — CBC WITH DIFFERENTIAL/PLATELET
BASOS ABS: 0.1 10*3/uL (ref 0.0–0.1)
Basophils Relative: 0 % (ref 0–1)
EOS PCT: 0 % (ref 0–5)
Eosinophils Absolute: 0 10*3/uL (ref 0.0–0.7)
HEMATOCRIT: 44.4 % (ref 36.0–46.0)
HEMOGLOBIN: 15.8 g/dL — AB (ref 12.0–15.0)
LYMPHS PCT: 7 % — AB (ref 12–46)
Lymphs Abs: 1.5 10*3/uL (ref 0.7–4.0)
MCH: 33.6 pg (ref 26.0–34.0)
MCHC: 35.6 g/dL (ref 30.0–36.0)
MCV: 94.5 fL (ref 78.0–100.0)
Monocytes Absolute: 1.3 10*3/uL — ABNORMAL HIGH (ref 0.1–1.0)
Monocytes Relative: 6 % (ref 3–12)
Neutro Abs: 18.9 10*3/uL — ABNORMAL HIGH (ref 1.7–7.7)
Neutrophils Relative %: 87 % — ABNORMAL HIGH (ref 43–77)
Platelets: 222 10*3/uL (ref 150–400)
RBC: 4.7 MIL/uL (ref 3.87–5.11)
RDW: 13.6 % (ref 11.5–15.5)
WBC: 21.9 10*3/uL — ABNORMAL HIGH (ref 4.0–10.5)

## 2014-06-23 LAB — I-STAT CG4 LACTIC ACID, ED: Lactic Acid, Venous: 1.48 mmol/L (ref 0.5–2.0)

## 2014-06-23 MED ORDER — AZITHROMYCIN 500 MG IV SOLR
500.0000 mg | Freq: Once | INTRAVENOUS | Status: AC
  Administered 2014-06-24: 500 mg via INTRAVENOUS
  Filled 2014-06-23: qty 500

## 2014-06-23 MED ORDER — SODIUM CHLORIDE 0.9 % IV BOLUS (SEPSIS)
500.0000 mL | Freq: Once | INTRAVENOUS | Status: AC
  Administered 2014-06-23: 500 mL via INTRAVENOUS

## 2014-06-23 MED ORDER — IPRATROPIUM-ALBUTEROL 0.5-2.5 (3) MG/3ML IN SOLN
3.0000 mL | Freq: Once | RESPIRATORY_TRACT | Status: AC
Start: 2014-06-23 — End: 2014-06-23
  Administered 2014-06-23: 3 mL via RESPIRATORY_TRACT
  Filled 2014-06-23: qty 3

## 2014-06-23 MED ORDER — METHYLPREDNISOLONE SODIUM SUCC 125 MG IJ SOLR
125.0000 mg | Freq: Once | INTRAMUSCULAR | Status: AC
  Administered 2014-06-23: 125 mg via INTRAVENOUS
  Filled 2014-06-23: qty 2

## 2014-06-23 NOTE — ED Notes (Signed)
Pt complains of a headache for several weeks, she's been constipated for two weeks and complains of being short of breath and congestion
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
## 2014-06-23 NOTE — ED Notes (Signed)
Pt placed on 2 liters O2 per her norm

## 2014-06-23 NOTE — ED Provider Notes (Signed)
  Physical Exam  BP 156/65 mmHg  Pulse 115  Temp(Src) 100.3 F (37.9 C) (Rectal)  Resp 18  SpO2 96%  Physical Exam  ED Course  Procedures  MDM  Pt with multiple medical comorbidities comes in with hypoxia and tachycardia. + fever, + wheezing. Pt has COPD hx, low concerns for PE. Initial clinical gestal is that pt has COPD exacerbation. CXR is clear. Anticipate obs admission for optimization.   EKG Interpretation  Date/Time:  Sunday June 23 2014 23:19:47 EDT Ventricular Rate:  116 PR Interval:  152 QRS Duration: 119 QT Interval:  358 QTC Calculation: 497 R Axis:   -75 Text Interpretation:  Sinus tachycardia Right atrial enlargement Right bundle branch block Confirmed by ZAVITZ  MD, JOSHUA ([id number]) on 06/23/2014 11:50:01 PM           Varney Biles, MD 06/23/14 2354

## 2014-06-24 ENCOUNTER — Encounter (HOSPITAL_COMMUNITY): Payer: Self-pay

## 2014-06-24 ENCOUNTER — Inpatient Hospital Stay (HOSPITAL_COMMUNITY): Payer: Medicare HMO

## 2014-06-24 ENCOUNTER — Emergency Department (HOSPITAL_COMMUNITY): Payer: Medicare HMO

## 2014-06-24 ENCOUNTER — Ambulatory Visit (HOSPITAL_COMMUNITY): Payer: Medicare HMO

## 2014-06-24 DIAGNOSIS — J45909 Unspecified asthma, uncomplicated: Secondary | ICD-10-CM | POA: Diagnosis present

## 2014-06-24 DIAGNOSIS — K2970 Gastritis, unspecified, without bleeding: Secondary | ICD-10-CM | POA: Diagnosis present

## 2014-06-24 DIAGNOSIS — G629 Polyneuropathy, unspecified: Secondary | ICD-10-CM | POA: Diagnosis present

## 2014-06-24 DIAGNOSIS — F17210 Nicotine dependence, cigarettes, uncomplicated: Secondary | ICD-10-CM | POA: Diagnosis present

## 2014-06-24 DIAGNOSIS — K3189 Other diseases of stomach and duodenum: Secondary | ICD-10-CM | POA: Diagnosis present

## 2014-06-24 DIAGNOSIS — E785 Hyperlipidemia, unspecified: Secondary | ICD-10-CM | POA: Diagnosis present

## 2014-06-24 DIAGNOSIS — F0280 Dementia in other diseases classified elsewhere without behavioral disturbance: Secondary | ICD-10-CM | POA: Diagnosis present

## 2014-06-24 DIAGNOSIS — R000 Tachycardia, unspecified: Secondary | ICD-10-CM | POA: Diagnosis present

## 2014-06-24 DIAGNOSIS — J9610 Chronic respiratory failure, unspecified whether with hypoxia or hypercapnia: Secondary | ICD-10-CM | POA: Diagnosis not present

## 2014-06-24 DIAGNOSIS — K5900 Constipation, unspecified: Secondary | ICD-10-CM | POA: Diagnosis present

## 2014-06-24 DIAGNOSIS — Z720 Tobacco use: Secondary | ICD-10-CM | POA: Diagnosis present

## 2014-06-24 DIAGNOSIS — Z86010 Personal history of colonic polyps: Secondary | ICD-10-CM | POA: Diagnosis not present

## 2014-06-24 DIAGNOSIS — E440 Moderate protein-calorie malnutrition: Secondary | ICD-10-CM | POA: Diagnosis present

## 2014-06-24 DIAGNOSIS — J9611 Chronic respiratory failure with hypoxia: Secondary | ICD-10-CM | POA: Diagnosis present

## 2014-06-24 DIAGNOSIS — E876 Hypokalemia: Secondary | ICD-10-CM | POA: Diagnosis not present

## 2014-06-24 DIAGNOSIS — Z79899 Other long term (current) drug therapy: Secondary | ICD-10-CM | POA: Diagnosis not present

## 2014-06-24 DIAGNOSIS — M810 Age-related osteoporosis without current pathological fracture: Secondary | ICD-10-CM | POA: Diagnosis present

## 2014-06-24 DIAGNOSIS — Z66 Do not resuscitate: Secondary | ICD-10-CM | POA: Diagnosis present

## 2014-06-24 DIAGNOSIS — J441 Chronic obstructive pulmonary disease with (acute) exacerbation: Secondary | ICD-10-CM | POA: Diagnosis present

## 2014-06-24 DIAGNOSIS — Z681 Body mass index (BMI) 19 or less, adult: Secondary | ICD-10-CM | POA: Diagnosis not present

## 2014-06-24 DIAGNOSIS — Z8249 Family history of ischemic heart disease and other diseases of the circulatory system: Secondary | ICD-10-CM | POA: Diagnosis not present

## 2014-06-24 DIAGNOSIS — I10 Essential (primary) hypertension: Secondary | ICD-10-CM | POA: Diagnosis not present

## 2014-06-24 DIAGNOSIS — K319 Disease of stomach and duodenum, unspecified: Secondary | ICD-10-CM | POA: Diagnosis not present

## 2014-06-24 DIAGNOSIS — R0602 Shortness of breath: Secondary | ICD-10-CM | POA: Diagnosis present

## 2014-06-24 DIAGNOSIS — R933 Abnormal findings on diagnostic imaging of other parts of digestive tract: Secondary | ICD-10-CM | POA: Diagnosis not present

## 2014-06-24 DIAGNOSIS — R627 Adult failure to thrive: Secondary | ICD-10-CM | POA: Diagnosis present

## 2014-06-24 DIAGNOSIS — Z9981 Dependence on supplemental oxygen: Secondary | ICD-10-CM | POA: Diagnosis not present

## 2014-06-24 DIAGNOSIS — R918 Other nonspecific abnormal finding of lung field: Secondary | ICD-10-CM | POA: Diagnosis not present

## 2014-06-24 DIAGNOSIS — K219 Gastro-esophageal reflux disease without esophagitis: Secondary | ICD-10-CM | POA: Diagnosis present

## 2014-06-24 DIAGNOSIS — R634 Abnormal weight loss: Secondary | ICD-10-CM | POA: Diagnosis present

## 2014-06-24 DIAGNOSIS — G309 Alzheimer's disease, unspecified: Secondary | ICD-10-CM | POA: Diagnosis present

## 2014-06-24 DIAGNOSIS — D539 Nutritional anemia, unspecified: Secondary | ICD-10-CM | POA: Diagnosis present

## 2014-06-24 DIAGNOSIS — G40909 Epilepsy, unspecified, not intractable, without status epilepticus: Secondary | ICD-10-CM | POA: Diagnosis present

## 2014-06-24 LAB — URINALYSIS, ROUTINE W REFLEX MICROSCOPIC
Bilirubin Urine: NEGATIVE
Glucose, UA: NEGATIVE mg/dL
Hgb urine dipstick: NEGATIVE
KETONES UR: NEGATIVE mg/dL
Leukocytes, UA: NEGATIVE
NITRITE: NEGATIVE
Protein, ur: NEGATIVE mg/dL
Specific Gravity, Urine: 1.006 (ref 1.005–1.030)
Urobilinogen, UA: 2 mg/dL — ABNORMAL HIGH (ref 0.0–1.0)
pH: 7 (ref 5.0–8.0)

## 2014-06-24 LAB — BASIC METABOLIC PANEL
Anion gap: 10 (ref 5–15)
Anion gap: 12 (ref 5–15)
BUN: 9 mg/dL (ref 6–20)
BUN: 12 mg/dL (ref 6–20)
CHLORIDE: 99 mmol/L — AB (ref 101–111)
CHLORIDE: 90 mmol/L — AB (ref 101–111)
CO2: 26 mmol/L (ref 22–32)
CO2: 31 mmol/L (ref 22–32)
CREATININE: 0.85 mg/dL (ref 0.44–1.00)
Calcium: 8.5 mg/dL — ABNORMAL LOW (ref 8.9–10.3)
Calcium: 9.2 mg/dL (ref 8.9–10.3)
Creatinine, Ser: 0.93 mg/dL (ref 0.44–1.00)
GFR calc non Af Amer: 58 mL/min — ABNORMAL LOW (ref >60)
GFR calc non Af Amer: >60 mL/min (ref >60)
GLUCOSE: 120 mg/dL — AB (ref 65–99)
Glucose, Bld: 188 mg/dL — ABNORMAL HIGH (ref 65–99)
Potassium: 2.9 mmol/L — ABNORMAL LOW (ref 3.5–5.1)
Potassium: 3.5 mmol/L (ref 3.5–5.1)
Sodium: 133 mmol/L — ABNORMAL LOW (ref 135–145)
Sodium: 135 mmol/L (ref 135–145)

## 2014-06-24 LAB — CBC
HCT: 39.7 % (ref 36.0–46.0)
Hemoglobin: 13.6 g/dL (ref 12.0–15.0)
MCH: 32.5 pg (ref 26.0–34.0)
MCHC: 34.3 g/dL (ref 30.0–36.0)
MCV: 94.7 fL (ref 78.0–100.0)
PLATELETS: 211 10*3/uL (ref 150–400)
RBC: 4.19 MIL/uL (ref 3.87–5.11)
RDW: 13.7 % (ref 11.5–15.5)
WBC: 19.6 10*3/uL — ABNORMAL HIGH (ref 4.0–10.5)

## 2014-06-24 LAB — HEPATIC FUNCTION PANEL
ALBUMIN: 3.7 g/dL (ref 3.5–5.0)
ALK PHOS: 106 U/L (ref 38–126)
ALT: 21 U/L (ref 14–54)
AST: 35 U/L (ref 15–41)
Bilirubin, Direct: 0.3 mg/dL (ref 0.1–0.5)
Indirect Bilirubin: 0.9 mg/dL (ref 0.3–0.9)
Total Bilirubin: 1.2 mg/dL (ref 0.3–1.2)
Total Protein: 7.5 g/dL (ref 6.5–8.1)

## 2014-06-24 LAB — MAGNESIUM: MAGNESIUM: 1.7 mg/dL (ref 1.7–2.4)

## 2014-06-24 LAB — TSH: TSH: 0.692 u[IU]/mL (ref 0.350–4.500)

## 2014-06-24 LAB — I-STAT CG4 LACTIC ACID, ED: LACTIC ACID, VENOUS: 1.25 mmol/L (ref 0.5–2.0)

## 2014-06-24 LAB — TROPONIN I: TROPONIN I: 0.03 ng/mL (ref <0.031)

## 2014-06-24 LAB — BRAIN NATRIURETIC PEPTIDE: B NATRIURETIC PEPTIDE 5: 187.6 pg/mL — AB (ref 0.0–100.0)

## 2014-06-24 MED ORDER — CEFTRIAXONE SODIUM IN DEXTROSE 20 MG/ML IV SOLN
1.0000 g | INTRAVENOUS | Status: DC
  Administered 2014-06-24 – 2014-06-25 (×2): 1 g via INTRAVENOUS
  Filled 2014-06-24 (×3): qty 50

## 2014-06-24 MED ORDER — IOHEXOL 300 MG/ML  SOLN
50.0000 mL | Freq: Once | INTRAMUSCULAR | Status: AC | PRN
  Administered 2014-06-24: 50 mL via ORAL

## 2014-06-24 MED ORDER — PRAVASTATIN SODIUM 80 MG PO TABS
80.0000 mg | ORAL_TABLET | Freq: Every day | ORAL | Status: DC
  Administered 2014-06-24 – 2014-06-25 (×2): 80 mg via ORAL
  Filled 2014-06-24 (×3): qty 1

## 2014-06-24 MED ORDER — GUAIFENESIN ER 600 MG PO TB12
600.0000 mg | ORAL_TABLET | Freq: Two times a day (BID) | ORAL | Status: DC
  Administered 2014-06-24 – 2014-06-26 (×4): 600 mg via ORAL
  Filled 2014-06-24 (×6): qty 1

## 2014-06-24 MED ORDER — POTASSIUM CHLORIDE CRYS ER 20 MEQ PO TBCR
40.0000 meq | EXTENDED_RELEASE_TABLET | Freq: Once | ORAL | Status: AC
  Administered 2014-06-24: 40 meq via ORAL
  Filled 2014-06-24: qty 2

## 2014-06-24 MED ORDER — METHYLPREDNISOLONE SODIUM SUCC 125 MG IJ SOLR
60.0000 mg | Freq: Three times a day (TID) | INTRAMUSCULAR | Status: DC
Start: 2014-06-24 — End: 2014-06-25
  Administered 2014-06-24 – 2014-06-25 (×4): 60 mg via INTRAVENOUS
  Filled 2014-06-24 (×8): qty 0.96

## 2014-06-24 MED ORDER — HEPARIN SODIUM (PORCINE) 5000 UNIT/ML IJ SOLN
5000.0000 [IU] | Freq: Three times a day (TID) | INTRAMUSCULAR | Status: DC
  Administered 2014-06-24 – 2014-06-26 (×6): 5000 [IU] via SUBCUTANEOUS
  Filled 2014-06-24 (×11): qty 1

## 2014-06-24 MED ORDER — PANTOPRAZOLE SODIUM 40 MG PO TBEC
40.0000 mg | DELAYED_RELEASE_TABLET | Freq: Every day | ORAL | Status: DC
  Administered 2014-06-24 – 2014-06-26 (×2): 40 mg via ORAL
  Filled 2014-06-24 (×3): qty 1

## 2014-06-24 MED ORDER — POTASSIUM CHLORIDE CRYS ER 20 MEQ PO TBCR
40.0000 meq | EXTENDED_RELEASE_TABLET | ORAL | Status: AC
  Administered 2014-06-24 (×2): 40 meq via ORAL
  Filled 2014-06-24 (×2): qty 2

## 2014-06-24 MED ORDER — DEXTROSE 5 % IV SOLN
500.0000 mg | INTRAVENOUS | Status: DC
  Administered 2014-06-24 – 2014-06-25 (×2): 500 mg via INTRAVENOUS
  Filled 2014-06-24 (×2): qty 500

## 2014-06-24 MED ORDER — SODIUM CHLORIDE 0.9 % IV BOLUS (SEPSIS)
1000.0000 mL | Freq: Once | INTRAVENOUS | Status: AC
  Administered 2014-06-24: 1000 mL via INTRAVENOUS

## 2014-06-24 MED ORDER — BOOST PLUS PO LIQD
237.0000 mL | Freq: Three times a day (TID) | ORAL | Status: DC
  Administered 2014-06-24 – 2014-06-26 (×4): 237 mL via ORAL
  Filled 2014-06-24 (×9): qty 237

## 2014-06-24 MED ORDER — BOOST / RESOURCE BREEZE PO LIQD
1.0000 | Freq: Three times a day (TID) | ORAL | Status: DC
  Administered 2014-06-24 – 2014-06-26 (×6): 1 via ORAL

## 2014-06-24 MED ORDER — DEXTROSE 5 % IV SOLN
1.0000 g | Freq: Once | INTRAVENOUS | Status: AC
  Administered 2014-06-24: 1 g via INTRAVENOUS
  Filled 2014-06-24: qty 10

## 2014-06-24 MED ORDER — POLYETHYLENE GLYCOL 3350 17 G PO PACK
17.0000 g | PACK | Freq: Two times a day (BID) | ORAL | Status: DC
  Administered 2014-06-24 – 2014-06-26 (×3): 17 g via ORAL
  Filled 2014-06-24 (×6): qty 1

## 2014-06-24 MED ORDER — IOHEXOL 300 MG/ML  SOLN
100.0000 mL | Freq: Once | INTRAMUSCULAR | Status: AC | PRN
  Administered 2014-06-24: 80 mL via INTRAVENOUS

## 2014-06-24 MED ORDER — AMLODIPINE BESYLATE 5 MG PO TABS
5.0000 mg | ORAL_TABLET | Freq: Every day | ORAL | Status: DC
  Administered 2014-06-24 – 2014-06-26 (×2): 5 mg via ORAL
  Filled 2014-06-24 (×3): qty 1

## 2014-06-24 MED ORDER — SODIUM CHLORIDE 0.9 % IV SOLN
INTRAVENOUS | Status: AC
  Administered 2014-06-24: 06:00:00 via INTRAVENOUS

## 2014-06-24 MED ORDER — NICOTINE 14 MG/24HR TD PT24
14.0000 mg | MEDICATED_PATCH | Freq: Every day | TRANSDERMAL | Status: DC
  Administered 2014-06-24 – 2014-06-26 (×3): 14 mg via TRANSDERMAL
  Filled 2014-06-24 (×3): qty 1

## 2014-06-24 MED ORDER — IPRATROPIUM-ALBUTEROL 0.5-2.5 (3) MG/3ML IN SOLN
3.0000 mL | RESPIRATORY_TRACT | Status: DC | PRN
  Administered 2014-06-25 – 2014-06-26 (×4): 3 mL via RESPIRATORY_TRACT
  Filled 2014-06-24 (×4): qty 3
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
## 2014-06-24 NOTE — Progress Notes (Signed)
  Echocardiogram 2D Echocardiogram has been performed.  Diamond Nickel 06/24/2014, 12:08 PM

## 2014-06-24 NOTE — Evaluation (Signed)
Physical Therapy Evaluation Patient Details Name: Ashlee [name] MRN: [id number] DOB: 11/17/1935 Today's Date: 06/24/2014   History of Present Illness  Pt is a 79 year old female admitted for acute COPD exacerbation and significant weight loss, concerns for malignany due to abnormal CT abdomen and pelvis  Clinical Impression  Pt admitted with above diagnosis. Pt currently with functional limitations due to the deficits listed below (see PT Problem List).  Pt will benefit from skilled PT to increase their independence and safety with mobility to allow discharge to the venue listed below.   Pt reports hx of vertigo for ?10 years, and states she has limited mobility, typically only transfers or very short distance ambulation prior to admission due to this dizziness and fear of falling.  Pt reports she was told no treatment for vertigo?  She states she becomes dizzy typically only upon standing.  Pt declined PT checking her BP (orthostatics) today however agreeable at a later time (reports bruising and tender from previous BP readings).  Pt declines SNF for rehab and would like any [facility] services to assist her at home.     Follow Up Recommendations SNF;Supervision for mobility/OOB;Home health PT (pt declines SNF so recommend HHPT, would benefit from aide)    Equipment Recommendations  Wheelchair (measurements PT);Wheelchair cushion (measurements PT)    Recommendations for Other Services       Precautions / Restrictions Precautions Precautions: Fall Precaution Comments: chronic O2      Mobility  Bed Mobility Overal bed mobility: Needs Assistance Bed Mobility: Supine to Sit;Sit to Supine     Supine to sit: Min guard;HOB elevated Sit to supine: Min guard;HOB elevated      Transfers Overall transfer level: Needs assistance Equipment used: None Transfers: Sit to/from Stand Sit to Stand: Min guard         General transfer comment: pt min/guard for safety as she reports dizziness,  performed a couple steps forward and then back to bed due to dizziness  Ambulation/Gait                Stairs            Wheelchair Mobility    Modified Rankin (Stroke Patients Only)       Balance Overall balance assessment: History of Falls                                           Pertinent Vitals/Pain Pain Assessment: No/denies pain    Home Living Family/patient expects to be discharged to:: Private residence Living Arrangements: Children   Type of Home: House Home Access: Stairs to enter Entrance Stairs-Rails: Right Entrance Stairs-Number of Steps: 4 Home Layout: Two level Home Equipment: Environmental consultant - 4 wheels;Bedside commode;Shower seat      Prior Function Level of Independence: Needs assistance   Gait / Transfers Assistance Needed: pt reports supervision for transfers, ADLs, states she gets dizzy with any standing which limits her mobility as she is fearful of falling, states she typically only ambulates less then 20 feet           Hand Dominance        Extremity/Trunk Assessment               Lower Extremity Assessment: Generalized weakness (diffuse muscle atrophy)         Communication   Communication: No difficulties  Cognition Arousal/Alertness: Awake/alert  Behavior During Therapy: WFL for tasks assessed/performed Overall Cognitive Status: Within Functional Limits for tasks assessed                      General Comments      Exercises        Assessment/Plan    PT Assessment Patient needs continued PT services  PT Diagnosis Difficulty walking;Generalized weakness   PT Problem List Decreased strength;Decreased activity tolerance;Decreased balance;Decreased mobility  PT Treatment Interventions DME instruction;Gait training;Functional mobility training;Patient/family education;Therapeutic activities;Therapeutic exercise;Balance training   PT Goals (Current goals can be found in the Care Plan  section) Acute Rehab PT Goals PT Goal Formulation: With patient Time For Goal Achievement: 07/08/14 Potential to Achieve Goals: Good    Frequency Min 3X/week   Barriers to discharge        Co-evaluation               End of Session Equipment Utilized During Treatment: Oxygen Activity Tolerance: Other (comment) (limited by dizziness) Patient left: in bed;with call bell/phone within reach;with family/visitor present           Time: 7159-5396 PT Time Calculation (min) (ACUTE ONLY): 12 min   Charges:   PT Evaluation $Initial PT Evaluation Tier I: 1 Procedure     PT G Codes:        Ilaria Much,KATHrine E 06/24/2014, 3:50 PM Carmelia Bake, PT, DPT 06/24/2014 Pager: [phone]

## 2014-06-24 NOTE — Progress Notes (Signed)
Pt arrived to unit room 1511 via stretcher, w/ 2 daughters. VS taken, pt oriented to room and callbell with no complications. 0/10 pain. Gait unsteady and general weakness. Pt guide at the bedside. Initial assessment complete. Willl continue to monitor throughout shift.
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
## 2014-06-24 NOTE — Care Management Note (Signed)
Case Management Note  Patient Details  Name: ANALISSA BAYLESS MRN: [id number] Date of Birth: 1935-08-06  Subjective/Objective:         79 yo female admitted with COPD acute exacerbation           Action/Plan:  Patient lives at home with her daughter and another daughter lives locally and supports patient. Patient has walker, shower chair, BSC, and home O2 at 2L [place] provided by [facility]. She follows with Dr. Joetta Manners. She agrees to [facility] services at time of discharge. Spoke with patient about Medicaid transportation to her MD appointments and PCS through Medicaid. Will gather information and provide to patient. CM will continue to follow. Expected Discharge Date:  06/28/14               Expected Discharge Plan:  [place]  In-House Referral:     Discharge planning Services  CM Consult  Post Acute Care Choice:    Choice offered to:     DME Arranged:    DME Agency:     HH Arranged:    HH Agency:     Status of Service:  In process, will continue to follow  Medicare Important Message Given:    Date Medicare IM Given:    Medicare IM give by:    Date Additional Medicare IM Given:    Additional Medicare Important Message give by:     If discussed at [place] of Stay Meetings, dates discussed:    Additional Comments:  Scot Dock, RN 06/24/2014, 3:43 PM

## 2014-06-24 NOTE — Progress Notes (Signed)
Initial Nutrition Assessment  DOCUMENTATION CODES:  Non-severe (moderate) malnutrition in context of chronic illness  INTERVENTION: - Continue Boost Plus TID, each supplement provides 360 kcal, 14 grams of protein - Will order Resource Breeze TID, each supplement provides 250 kcal and 9 grams of protein - RD will continue to monitor for needs   NUTRITION DIAGNOSIS:  Inadequate oral intake related to other (see comment) (lack of appetite and meal refusal) as evidenced by per patient/family report.  GOAL:  Patient will meet greater than or equal to 90% of their needs  MONITOR:  PO intake, Supplement acceptance, Weight trends, Labs, I & O's  REASON FOR ASSESSMENT:  Malnutrition Screening Tool  ASSESSMENT: 79 y/o female who was brought to the ER due to c/o SOB starting a couple days ago. She has been wheezing and coughing. Her cough is productive of yellowish sputum. She does smoke. She denies any fevers, chills, nausea or vomiting. She states she's been constipated and hasn't had a BM in 2 weeks. Her last colonoscopy was 1 year ago. She has colonoscopy every 2 years due to polyps. Her family reports a 10 pound weight loss over the last month. Her appetite has been poor.   Pt seen for MST. BMI indicates borderline normal weight and underweight status. Family reports that pt only eats 1 meal/day despite much encouragement and that even at this meal pt eats very little. Pt was not drinking supplements PTA but she states she had [employer] today and enjoyed it; made pt and family aware of how to purchase this supplement for home use. Family indicates that pt has lost 20+ lbs in the past few months. Per weight hx review, pt has lost 16 lbs (12% body weight) in 8 months which is significant for time frame.  Pt states she had some breakfast this AM; chart review indicates 75% intake at that meal. Unsure if pt is fully meeting needs at this time. Labs and medications  reviewed.  Height:  Ht Readings from Last 1 Encounters:  06/24/14 5' 5.5" (1.664 m)    Weight:  Wt Readings from Last 1 Encounters:  06/24/14 113 lb (51.256 kg)    Ideal Body Weight:  58 kg (kg)  Wt Readings from Last 10 Encounters:  06/24/14 113 lb (51.256 kg)  04/10/14 13 lb 9.6 oz (6.169 kg)  12/07/13 129 lb 10.1 oz (58.8 kg)  11/26/13 128 lb 12.8 oz (58.423 kg)  09/06/13 130 lb (58.968 kg)  07/11/13 131 lb (59.421 kg)  06/29/13 130 lb 1.9 oz (59.022 kg)  06/27/13 129 lb 12.8 oz (58.877 kg)  10/26/12 139 lb 6.4 oz (63.231 kg)  09/01/12 136 lb (61.689 kg)    BMI:  Body mass index is 18.51 kg/(m^2).  Estimated Nutritional Needs:  Kcal:  1300-1500  Protein:  50-60 grams  Fluid:  2 L/day  Skin:  Reviewed, no issues  Diet Order:  Diet Heart Room service appropriate?: Yes; Fluid consistency:: Thin Diet NPO time specified  EDUCATION NEEDS:  No education needs identified at this time   Intake/Output Summary (Last 24 hours) at 06/24/14 1332 Last data filed at 06/24/14 0834  Gross per 24 hour  Intake    240 ml  Output      0 ml  Net    240 ml    Last BM:  6/13   Jarome Matin, RD, LDN Inpatient Clinical Dietitian Pager # [phone] After hours/weekend pager # [phone]

## 2014-06-24 NOTE — ED Notes (Signed)
Hospitalist at bedside 

## 2014-06-24 NOTE — Progress Notes (Signed)
Patient Demographics  Ashlee [name], is a 79 y.o. female, DOB - 1935/05/30, WHQ:[id number]  Admit date - 06/23/2014   Admitting Physician Quintella Baton, MD  Outpatient Primary MD for the patient is ASRES,ALEHEGN, MD  LOS - 0   Chief Complaint  Patient presents with  . Weight Loss       Admission HPI/Brief narrative: 79 year old female with known history of COPD, admitted for COPD treatment, endorses significant weight loss, CT abdomen significant for soft mass in stomach, 2 lung nodules, and nodule and has Reglan suspicious for malignancy.  Subjective:   Ashlee [name] today has, No headache, No chest pain, No abdominal pain - No Nausea, No new weakness tingling or numbness, patient reports shortness of breath and cough.  Assessment & Plan    Active Problems:   Hyperlipidemia   Essential hypertension   Hypokalemia   COPD with acute exacerbation   Tobacco abuse   Chronic respiratory failure   Weight loss   FTT (failure to thrive) in adult   Malnutrition of moderate degree   Gastric mass  COPD exacerbation - Patient on IV steroid-induced, as well on Rocephin and azithromycin giving her productive green putum, continue with nebs as needed, pulmonary toilet, oxygen as needed.  Significant weight loss and abnormal finding on imaging - Suspicious for malignancy, GI consulted regarding finding on soft mass in stomach, plan is for EGD in a.m., CT chest pending for further workup.  Chronic respiratory failure -Continue with oxygen  Hypokalemia - Repleted, recheck in a.m.  Hyperlipidemia -Continue with statin  Alzheimer's dementia - Mild, stable  Code Status: DNR  Family Communication: Family at bedside  Disposition Plan: Pending further workup   Procedures    Consults    Gastroenterology Medications  Scheduled Meds: . amLODipine  5 mg Oral Daily  . azithromycin  500 mg  Intravenous Q24H  . cefTRIAXone (ROCEPHIN)  IV  1 g Intravenous Q24H  . feeding supplement (RESOURCE BREEZE)  1 Container Oral TID BM  . guaiFENesin  600 mg Oral BID  . heparin  5,000 Units Subcutaneous 3 times per day  . lactose free nutrition  237 mL Oral TID WC  . methylPREDNISolone (SOLU-MEDROL) injection  60 mg Intravenous 3 times per day  . nicotine  14 mg Transdermal Daily  . pantoprazole  40 mg Oral Daily  . polyethylene glycol  17 g Oral BID  . pravastatin  80 mg Oral q1800   Continuous Infusions:  PRN Meds:.ipratropium-albuterol  DVT Prophylaxis   Heparin   Lab Results  Component Value Date   PLT 211 06/24/2014    Antibiotics    Anti-infectives    Start     Dose/Rate Route Frequency Ordered Stop   06/24/14 2200  azithromycin (ZITHROMAX) 500 mg in dextrose 5 % 250 mL IVPB     500 mg 250 mL/hr over 60 Minutes Intravenous Every 24 hours 06/24/14 0524     06/24/14 2200  cefTRIAXone (ROCEPHIN) 1 g in dextrose 5 % 50 mL IVPB - Premix     1 g 100 mL/hr over 30 Minutes Intravenous Every 24 hours 06/24/14 0524     06/24/14 0015  cefTRIAXone (ROCEPHIN) 1 g in dextrose 5 % 50 mL IVPB  1 g 100 mL/hr over 30 Minutes Intravenous  Once 06/24/14 0000 06/24/14 0048   06/24/14 0000  azithromycin (ZITHROMAX) 500 mg in dextrose 5 % 250 mL IVPB     500 mg 250 mL/hr over 60 Minutes Intravenous  Once 06/23/14 2358 06/24/14 0139          Objective:   Filed Vitals:   06/24/14 0134 06/24/14 0200 06/24/14 0330 06/24/14 0459  BP: 133/52   133/64  Pulse: 103 103 98 100  Temp:    98.4 F (36.9 C)  TempSrc:    Oral  Resp: '20 26 24 22  '$ Height:    5' 5.5" (1.664 m)  Weight:      SpO2: 95% 93% 95% 95%    Wt Readings from Last 3 Encounters:  06/24/14 51.256 kg (113 lb)  04/10/14 6.169 kg (13 lb 9.6 oz)  12/07/13 58.8 kg (129 lb 10.1 oz)     Intake/Output Summary (Last 24 hours) at 06/24/14 1449 Last data filed at 06/24/14 0834  Gross per 24 hour  Intake    240 ml    Output      0 ml  Net    240 ml     Physical Exam  Awake Alert, Oriented , No new F.N deficits, Normal affect [name],PERRAL Supple Neck,No JVD, No cervical lymphadenopathy appriciated.  Symmetrical Chest wall movement, decreased air movement bilaterally, scattered wheezing RRR,No Gallops,Rubs or new Murmurs, No Parasternal Heave +ve B.Sounds, Abd Soft, No tenderness, No organomegaly appriciated, No rebound - guarding or rigidity. No Cyanosis, Clubbing or edema, No new Rash or bruise     Data Review   Micro Results No results found for this or any previous visit (from the past 240 hour(s)).  Radiology Reports Dg Chest 2 View  06/23/2014   CLINICAL DATA:  Constipation for 2 weeks. Headache. Shortness of breath and congestion. Initial encounter.  EXAM: CHEST  2 VIEW  COMPARISON:  Chest radiograph performed 12/04/2013  FINDINGS: The lungs are hyperexpanded, with flattening of the hemidiaphragms, compatible with COPD. Vascular congestion is noted. Minimal bilateral atelectasis is seen. There is no evidence of pleural effusion or pneumothorax.  The heart is borderline normal in size. No acute osseous abnormalities are seen.  IMPRESSION: Vascular congestion noted. Minimal bilateral atelectasis seen. Findings of COPD.   Electronically Signed   By: Garald Balding M.D.   On: 06/23/2014 23:27   Ct Head Wo Contrast  06/24/2014   CLINICAL DATA:  Headaches for several weeks. Confusion. History of hypertension and seizure disorder.  EXAM: CT HEAD WITHOUT CONTRAST  TECHNIQUE: Contiguous axial images were obtained from the base of the skull through the vertex without intravenous contrast.  COMPARISON:  07/25/2008  FINDINGS: Examination is technically limited due to motion artifact. Diffuse cerebral atrophy most prominent in the frontal and temporal regions. Ventricular dilatation likely due to central atrophy but asymmetrically more prominent on the left. This is unchanged. Low-attenuation changes in the  deep white matter consistent with small vessel ischemia. No mass effect or midline shift. No abnormal extra-axial fluid collections. Gray-white matter junctions are distinct. Basal cisterns are not effaced. No evidence of acute intracranial hemorrhage. No depressed skull fractures. Visualized paranasal sinuses and mastoid air cells are not opacified.  IMPRESSION: No acute intracranial abnormalities. Chronic atrophy and small vessel ischemic changes.   Electronically Signed   By: Lucienne Capers M.D.   On: 06/24/2014 02:47   Ct Abdomen Pelvis W Contrast  06/24/2014   CLINICAL DATA:  Constipation  for 2 weeks.  White cell count 21.9.  EXAM: CT ABDOMEN AND PELVIS WITH CONTRAST  TECHNIQUE: Multidetector CT imaging of the abdomen and pelvis was performed using the standard protocol following bolus administration of intravenous contrast.  CONTRAST:  66m OMNIPAQUE IOHEXOL 300 MG/ML  SOLN  COMPARISON:  None.  FINDINGS: Two nodules in the right lung base, measuring 10 and 17 mm, respectively. This is worrisome for metastasis. Suggest a follow-up with CT chest for further evaluation. Small esophageal hiatal hernia.  Surgical absence of the gallbladder. The liver, spleen, pancreas, kidneys, abdominal aorta, and inferior vena cava are unremarkable. Right adrenal gland nodule measures 3.5 by 1.9 cm. Density measurements are indeterminate and metastasis is not excluded. There is a soft tissue nodule adjacent to the greater curvature of the stomach measuring about 2.4 x 1.8 cm. This could represent a gastric mass such is gastrointestinal stromal tumor or it could represent adjacent enlarged lymph node or peritoneal deposit. The stomach and small bowel are mostly decompressed. Contrast material flows through to the colon without evidence of bowel obstruction. Diffusely stool-filled colon. No free air or free fluid in the abdomen. Abdominal wall musculature appears intact. Scarring in the abdomen consistent with postoperative  change. Prominent atherosclerotic calcification throughout the abdominal aorta. There is likely to be significant stenosis of the distal aorta and proximal iliac arteries. Small accessory spleen.  Pelvis: Appendix is surgically absent. Uterus and ovaries are not enlarged. Calcification in the uterus, likely a fibroid. Bladder wall is not thickened. No free or loculated pelvic fluid collections. No pelvic mass or lymphadenopathy. Degenerative changes in the spine. No destructive bone lesions. Lumbar scoliosis convex towards the right.  IMPRESSION: Two right lung nodules, largest measuring 17 mm. 3.5 cm right adrenal gland nodule. Soft tissue density in or along the greater curvature of the stomach. Metastatic disease is suspected. Small esophageal hiatal hernia.   Electronically Signed   By: WLucienne CapersM.D.   On: 06/24/2014 03:05     CBC  Recent Labs Lab 06/23/14 2351 06/24/14 0710  WBC 21.9* 19.6*  HGB 15.8* 13.6  HCT 44.4 39.7  PLT 222 211  MCV 94.5 94.7  MCH 33.6 32.5  MCHC 35.6 34.3  RDW 13.6 13.7  LYMPHSABS 1.5  --   MONOABS 1.3*  --   EOSABS 0.0  --   BASOSABS 0.1  --     Chemistries   Recent Labs Lab 06/23/14 2349 06/23/14 2351 06/24/14 0710  NA  --  133* 135  K  --  2.9* 3.5  CL  --  90* 99*  CO2  --  31 26  GLUCOSE  --  120* 188*  BUN  --  12 9  CREATININE  --  0.93 0.85  CALCIUM  --  9.2 8.5*  MG  --   --  1.7  AST 35  --   --   ALT 21  --   --   ALKPHOS 106  --   --   BILITOT 1.2  --   --    ------------------------------------------------------------------------------------------------------------------ estimated creatinine clearance is 45.6 mL/min (by C-G formula based on Cr of 0.85). ------------------------------------------------------------------------------------------------------------------ No results for input(s): HGBA1C in the last 72  hours. ------------------------------------------------------------------------------------------------------------------ No results for input(s): CHOL, HDL, LDLCALC, TRIG, CHOLHDL, LDLDIRECT in the last 72 hours. ------------------------------------------------------------------------------------------------------------------  Recent Labs  06/23/14 2351  TSH 0.692   ------------------------------------------------------------------------------------------------------------------ No results for input(s): VITAMINB12, FOLATE, FERRITIN, TIBC, IRON, RETICCTPCT in the last 72 hours.  Coagulation profile No  results for input(s): INR, PROTIME in the last 168 hours.  No results for input(s): DDIMER in the last 72 hours.  Cardiac Enzymes  Recent Labs Lab 06/23/14 2351  TROPONINI 0.03   ------------------------------------------------------------------------------------------------------------------ Invalid input(s): POCBNP     Time Spent in minutes   30 minutes   Shaily Librizzi M.D on 06/24/2014 at 2:49 PM  Between 7am to 7pm - Pager - [phone]  After 7pm go to [email or web address] - password [facility]  [facility]   Office  [phone]

## 2014-06-24 NOTE — ED Notes (Signed)
Pt placed on bedpan in attempt to collect urine sample.

## 2014-06-25 ENCOUNTER — Ambulatory Visit (HOSPITAL_COMMUNITY): Payer: Medicare HMO

## 2014-06-25 ENCOUNTER — Telehealth: Payer: Self-pay

## 2014-06-25 ENCOUNTER — Encounter (HOSPITAL_COMMUNITY): Payer: Self-pay | Admitting: Radiology

## 2014-06-25 ENCOUNTER — Inpatient Hospital Stay (HOSPITAL_COMMUNITY): Payer: Medicare HMO | Admitting: Certified Registered Nurse Anesthetist

## 2014-06-25 ENCOUNTER — Encounter (HOSPITAL_COMMUNITY)
Admission: EM | Disposition: A | Discharge status: Home / Self Care | Payer: Self-pay | Source: Home / Self Care | Attending: Family Medicine

## 2014-06-25 DIAGNOSIS — J9610 Chronic respiratory failure, unspecified whether with hypoxia or hypercapnia: Secondary | ICD-10-CM

## 2014-06-25 DIAGNOSIS — K3189 Other diseases of stomach and duodenum: Secondary | ICD-10-CM

## 2014-06-25 HISTORY — PX: ESOPHAGOGASTRODUODENOSCOPY (EGD) WITH PROPOFOL: SHX5813

## 2014-06-25 LAB — BASIC METABOLIC PANEL
Anion gap: 8 (ref 5–15)
BUN: 18 mg/dL (ref 6–20)
CALCIUM: 8.8 mg/dL — AB (ref 8.9–10.3)
CO2: 27 mmol/L (ref 22–32)
Chloride: 104 mmol/L (ref 101–111)
Creatinine, Ser: 1.19 mg/dL — ABNORMAL HIGH (ref 0.44–1.00)
GFR calc Af Amer: 50 mL/min — ABNORMAL LOW (ref >60)
GFR calc non Af Amer: 43 mL/min — ABNORMAL LOW (ref >60)
GLUCOSE: 158 mg/dL — AB (ref 65–99)
Potassium: 4.5 mmol/L (ref 3.5–5.1)
Sodium: 139 mmol/L (ref 135–145)

## 2014-06-25 LAB — CBC
HEMATOCRIT: 38 % (ref 36.0–46.0)
Hemoglobin: 12.7 g/dL (ref 12.0–15.0)
MCH: 32.4 pg (ref 26.0–34.0)
MCHC: 33.4 g/dL (ref 30.0–36.0)
MCV: 96.9 fL (ref 78.0–100.0)
PLATELETS: 234 10*3/uL (ref 150–400)
RBC: 3.92 MIL/uL (ref 3.87–5.11)
RDW: 14.3 % (ref 11.5–15.5)
WBC: 19.4 10*3/uL — ABNORMAL HIGH (ref 4.0–10.5)

## 2014-06-25 LAB — AFP TUMOR MARKER: AFP TUMOR MARKER: 1.5 ng/mL (ref 0.0–8.3)

## 2014-06-25 LAB — CA 125: CA 125: 29 U/mL (ref 0.0–38.1)

## 2014-06-25 LAB — CEA: CEA: 4.7 ng/mL (ref 0.0–4.7)

## 2014-06-25 SURGERY — ESOPHAGOGASTRODUODENOSCOPY (EGD) WITH PROPOFOL
Anesthesia: Monitor Anesthesia Care

## 2014-06-25 MED ORDER — LIDOCAINE HCL (CARDIAC) 20 MG/ML IV SOLN
INTRAVENOUS | Status: DC | PRN
  Administered 2014-06-25: 80 mg via INTRAVENOUS

## 2014-06-25 MED ORDER — BUPROPION HCL ER (SR) 150 MG PO TB12: 150.0000 mg | ORAL_TABLET | Freq: Two times a day (BID) | ORAL | Status: DC

## 2014-06-25 MED ORDER — PROPOFOL 10 MG/ML IV BOLUS
INTRAVENOUS | Status: DC | PRN
  Administered 2014-06-25 (×2): 10 mg via INTRAVENOUS

## 2014-06-25 MED ORDER — LACTATED RINGERS IV SOLN
INTRAVENOUS | Status: DC
  Administered 2014-06-25: 1000 mL via INTRAVENOUS

## 2014-06-25 MED ORDER — ONDANSETRON HCL 4 MG/2ML IJ SOLN
INTRAMUSCULAR | Status: DC | PRN
  Administered 2014-06-25: 4 mg via INTRAVENOUS

## 2014-06-25 MED ORDER — FENTANYL CITRATE (PF) 100 MCG/2ML IJ SOLN: 25.0000 ug | INTRAMUSCULAR | Status: DC | PRN

## 2014-06-25 MED ORDER — DIPHENHYDRAMINE HCL 25 MG PO CAPS
25.0000 mg | ORAL_CAPSULE | Freq: Three times a day (TID) | ORAL | Status: DC | PRN
  Administered 2014-06-25 – 2014-06-26 (×2): 25 mg via ORAL
  Filled 2014-06-25 (×2): qty 1

## 2014-06-25 MED ORDER — LIDOCAINE HCL (CARDIAC) 20 MG/ML IV SOLN
INTRAVENOUS | Status: AC
  Filled 2014-06-25: qty 5

## 2014-06-25 MED ORDER — PROPOFOL INFUSION 10 MG/ML OPTIME
INTRAVENOUS | Status: DC | PRN
  Administered 2014-06-25: 140 ug/kg/min via INTRAVENOUS

## 2014-06-25 MED ORDER — METHYLPREDNISOLONE SODIUM SUCC 40 MG IJ SOLR
40.0000 mg | Freq: Three times a day (TID) | INTRAMUSCULAR | Status: DC
  Administered 2014-06-25 – 2014-06-26 (×4): 40 mg via INTRAVENOUS
  Filled 2014-06-25 (×6): qty 1

## 2014-06-25 MED ORDER — BUPROPION HCL ER (SR) 150 MG PO TB12
150.0000 mg | ORAL_TABLET | Freq: Every day | ORAL | Status: DC
  Administered 2014-06-25 – 2014-06-26 (×2): 150 mg via ORAL
  Filled 2014-06-25 (×2): qty 1

## 2014-06-25 MED ORDER — PROPOFOL 10 MG/ML IV BOLUS
INTRAVENOUS | Status: AC
  Filled 2014-06-25: qty 20

## 2014-06-25 MED ORDER — SODIUM CHLORIDE 0.9 % IV SOLN
INTRAVENOUS | Status: DC
  Administered 2014-06-25: 11:00:00 via INTRAVENOUS

## 2014-06-25 MED ORDER — ONDANSETRON HCL 4 MG/2ML IJ SOLN
INTRAMUSCULAR | Status: AC
  Filled 2014-06-25: qty 2

## 2014-06-25 SURGICAL SUPPLY — 15 items

## 2014-06-25 NOTE — Consult Note (Signed)
PULMONARY / CRITICAL CARE MEDICINE   Name: Ashlee [name] MRN: [id number] DOB: 1935-05-07    ADMISSION DATE:  06/23/2014 CONSULTATION DATE:  6/14  REFERRING MD :  Elgergawy  CHIEF COMPLAINT: lung nodules    INITIAL PRESENTATION:  Ashlee [name] is a 79 yo female who is a current smoker and previous hx of COPD. Admitted 6/12 for COPD exacerbation who has noted to have had decreased appetite and 10 pound weight loss over the past several weeks. PCCM consulted for 2 lung nodules noted on CT  6/13 incidentally identified when evaluating general GI complaints.  STUDIES:  6/14 CT chest:  Right lower lobe pulmonary nodularity reidentified with areas of superimposed bibasilar scarring and right lower lobe atelectasis.This could represent metastatic disease but could be more specifically evaluated at PET-CT.Emphysema.No other evidence for intrathoracic metastatic disease. Right adrenal mass with imaging features that could suggest adenoma although the presence of a soft tissue nodule raises the question of collision tumor and metastatic disease.  6/13 CT ABD/Pelvis:  Two right lung nodules, largest measuring 17 mm. 3.5 cm right adrenal gland nodule. Soft tissue density in or along the greater curvature of the stomach. Metastatic disease is suspected. Small esophageal hiatal hernia.  6/13 Echo:  Left ventricle: The cavity size was normal. Systolic function was normal. The estimated ejection fraction was in the range of 55%to 65%. Wall motion was normal; there were no regional wall motion abnormalities. There was an increased relative contribution of atrial contraction to ventricular filling.Doppler parameters are consistent with abnormal left ventricular relaxation (grade 1 diastolic dysfunction). Aortic valve: Poorly visualized.  6/13 CT head: No acute intracranial abnormalities. Chronic atrophy and small vessel ischemic changes.  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 She wears 2L oxygen at night. She was admitted 6/12 for COPD exacerbation. She is currently receiving antibiotic therapy for her COPD exacerbation. She is noted to have had poor appetite, increasing fatigue with significant weight loss of 10 pounds of the past several weeks, and constipation which lead to CT abd.The results of this showed: soft tissue density in the stomach, lung nodules, and adrenal nodule which raised suspicionfor malignancy.  She underwent EGD with biopsy on 6/14. PCCM has been consulted d/t two lung nodules found incidently on CT.  PAST MEDICAL HISTORY :   has a past medical history of HTN (hypertension); Asthma; Dyslipidemia; Carotid stenosis; Nonspecific elevation of levels of transaminase or lactic acid dehydrogenase (LDH); Contact dermatitis; Polyneuropathy in other diseases classified elsewhere; Folate-deficiency anemia; Lump or mass in breast; Acid reflux disease; Colonic polyp; Fracture of right pubis; Osteoporosis; Vocal cord leukoplakia; Seizure disorder; Panic attack; Renal insufficiency; COPD (chronic obstructive pulmonary disease); and Oxygen dependent.  has past surgical history that includes Tubal ligation; Breast biopsy; Appendectomy; Cesarean section; Cholecystectomy; Colonoscopy; Upper gastrointestinal endoscopy; and Colonoscopy (N/A, 09/12/2012). Prior to Admission medications   Medication Sig Start Date End Date Taking? Authorizing Provider  albuterol (PROVENTIL HFA;VENTOLIN HFA) 108 (90 BASE) MCG/ACT inhaler Inhale 1-2 puffs into the lungs every 6 (six) hours as needed for wheezing or shortness of breath. 05/09/14  Yes Elsie Stain, MD  amLODipine (NORVASC) 5 MG tablet Take 5 mg by mouth daily.    Yes Historical Provider, MD  budesonide-formoterol  (SYMBICORT) 160-4.5 MCG/ACT inhaler  Inhale 2 puffs into the lungs 2 (two) times daily. 03/06/14  Yes Elsie Stain, MD  Calcium Carbonate-Vitamin D (CALTRATE 600+D) 600-400 MG-UNIT per tablet Take 1 tablet by mouth 2 (two) times daily.     Yes Historical Provider, MD  [employer] NASAL SPRAY 0.05 % nasal spray Place 1 spray into both nostrils as needed for congestion.    Yes Historical Provider, MD  Cyanocobalamin (VITAMIN B-12 PO) Take 1 tablet by mouth daily.   Yes Historical Provider, MD  hydrochlorothiazide (MICROZIDE) 12.5 MG capsule Take 12.5 mg by mouth daily. 03/18/14  Yes Historical Provider, MD  omeprazole (PRILOSEC) 40 MG capsule Take 40 mg by mouth daily as needed (heart burn).    Yes Historical Provider, MD  polyethylene glycol powder (MIRALAX) powder Take 17 g by mouth daily as needed. constipation   Yes Historical Provider, MD  pravastatin (PRAVACHOL) 80 MG tablet Take 80 mg by mouth daily.    Yes Historical Provider, MD  tiotropium (SPIRIVA) 18 MCG inhalation capsule Place 1 capsule (18 mcg total) into inhaler and inhale daily. 02/06/14  Yes Elsie Stain, MD  ALPRAZolam Duanne Moron) 1 MG tablet Take 0.5-1 mg by mouth 3 (three) times daily. 0.5 mg in the morning and at noon, 1 mg at bedtime    Historical Provider, MD  bisacodyl (EQ WOMANS LAXATIVE) 5 MG EC tablet Take 5 mg by mouth daily as needed for constipation.     Historical Provider, MD  guaiFENesin (MUCINEX) 600 MG 12 hr tablet Take 1 tablet (600 mg total) by mouth 2 (two) times daily. Patient not taking: Reported on 04/10/2014 12/07/13   Belkys A Regalado, MD  levofloxacin (LEVAQUIN) 250 MG tablet Take 1 tablet (250 mg total) by mouth at bedtime. Patient not taking: Reported on 04/10/2014 12/07/13   Elmarie Shiley, MD   Allergies  Allergen Reactions  . Alendronate Sodium     REACTION: indigestion  . Donepezil Hydrochloride     REACTION: SOB, nausea  . Lisinopril     REACTION: elevated creatinine    FAMILY HISTORY:  has no  family status information on file.  SOCIAL HISTORY:  reports that she has been smoking Cigarettes.  She has a 16.25 pack-year smoking history. She has never used smokeless tobacco. She reports that she does not drink alcohol or use illicit drugs.  REVIEW OF SYSTEMS:  Gen: + fatigue, weakness, decreased appetite, HENT: no nasal congestion, HA, sore throat, visual or hearing changes. Pulm: cough +prod now clear sputum, SOB at baseline w/ minimal ADLs. Dyspnea improved since admit. No chest pain, + wheeze, no pleuritic CP. Card: no cp, no palps. No LE swelling Abd: + 14 lb wt loss, vague abd discomfort, + constipation. Poor appetite, Endo: no sig hot/cold abnormalities, no hair loss. GU: no sig ab. Neuro: dizzy at times. Does have hallucinations at times.   SUBJECTIVE:  Feels better  VITAL SIGNS: Temp:  [98.2 F (36.8 C)-98.5 F (36.9 C)] 98.5 F (36.9 C) (06/14 1035) Pulse Rate:  [86-112] 112 (06/14 1035) Resp:  [21-35] 23 (06/14 1111) BP: (119-160)/(48-82) 138/82 mmHg (06/14 1111) SpO2:  [96 %-100 %] 100 % (06/14 1111) HEMODYNAMICS:   VENTILATOR SETTINGS:   INTAKE / OUTPUT:  Intake/Output Summary (Last 24 hours) at 06/25/14 1310 Last data filed at 06/25/14 1059  Gross per 24 hour  Intake    500 ml  Output    250 ml  Net    250 ml    PHYSICAL EXAMINATION: General:  Alert  and oriented, frequently needed repeated answers to simple questions Neuro: PERRL,  II-XII grossly intact HEENT: [place]/AT Cardiovascular:  Regular rate and rhythm, no rubs, gallops, murmurs Lungs:  Fine inspiratory crackles in left base not cleared with cough Abdomen:  Soft, non ender, nondistended Musculoskeletal: grossly intact Skin:  No rashes, lesions  LABS:  CBC  Recent Labs Lab 06/23/14 2351 06/24/14 0710 06/25/14 0511  WBC 21.9* 19.6* 19.4*  HGB 15.8* 13.6 12.7  HCT 44.4 39.7 38.0  PLT 222 211 234   Coag's No results for input(s): APTT, INR in the last 168 hours. BMET  Recent Labs Lab  06/23/14 2351 06/24/14 0710 06/25/14 0511  NA 133* 135 139  K 2.9* 3.5 4.5  CL 90* 99* 104  CO2 '31 26 27  '$ BUN '12 9 18  '$ CREATININE 0.93 0.85 1.19*  GLUCOSE 120* 188* 158*   Electrolytes  Recent Labs Lab 06/23/14 2351 06/24/14 0710 06/25/14 0511  CALCIUM 9.2 8.5* 8.8*  MG  --  1.7  --    Sepsis Markers  Recent Labs Lab 06/23/14 2355 06/24/14 0240  LATICACIDVEN 1.48 1.25   ABG No results for input(s): PHART, PCO2ART, PO2ART in the last 168 hours. Liver Enzymes  Recent Labs Lab 06/23/14 2349  AST 35  ALT 21  ALKPHOS 106  BILITOT 1.2  ALBUMIN 3.7   Cardiac Enzymes  Recent Labs Lab 06/23/14 2351  TROPONINI 0.03   Glucose No results for input(s): GLUCAP in the last 168 hours.  Imaging Ct Chest Wo Contrast  06/25/2014   CLINICAL DATA:  Cough, low-grade fever, shortness of breath. Pulmonary nodules.  EXAM: CT CHEST WITHOUT CONTRAST  TECHNIQUE: Multidetector CT imaging of the chest was performed following the standard protocol without IV contrast.  COMPARISON:  Chest radiograph 06/23/2014, CT abdomen/pelvis 06/24/2014  FINDINGS: Mediastinum/Nodes: Heart size is normal. Extensive atheromatous aortic and coronary arterial calcification with probable coronary arterial stents. No pericardial effusion. No lymphadenopathy allowing for noncontrast technique. Thyroid is inhomogeneous without measurable mass.  Lungs/Pleura: Diffuse emphysematous change is identified. Curvilinear right middle lobe, lingular, and right lower lobe presumed scarring is noted. Dominant 1 cm right lower lobe pulmonary parenchymal nodule is identified image 44. Mild bibasilar bronchial wall thickening. The previously seen 1.7 cm right lower lobe nodule is partly obscured within presumed right lower lobe superimposed atelectasis, image 48. No other pulmonary nodules, masses, or other consolidation is identified. Central airways are patent.  Upper abdomen: Although the 3.1 x 1.9 cm low-density right  adrenal mass may represent an adenoma given low-density, there is an apparent soft tissue component measuring 0.8 cm image 52. Cholecystectomy clips are noted. The previously seen mass adjacent to the stomach is likely not included in the imaging field of view. Splenule reidentified. 5 mm omental nodule is noted image 64. Extensive vascular calcification reidentified.  Musculoskeletal: Bones is subjectively osteopenic. Patient is rotated to the right. No acute osseous abnormality. Patient is kyphotic without focal compression deformity.  IMPRESSION: Right lower lobe pulmonary nodularity reidentified with areas of superimposed bibasilar scarring and right lower lobe atelectasis. This could represent metastatic disease but could be more specifically evaluated at PET-CT.  Emphysema.  No other evidence for intrathoracic metastatic disease.  Right adrenal mass with imaging features that could suggest adenoma although the presence of a soft tissue nodule raises the question of collision tumor and metastatic disease.   Electronically Signed   By: Conchita Paris M.D.   On: 06/25/2014 09:24     ASSESSMENT / PLAN:  RLL lung Nodules AECOPD  COPD (GOLD C) Protein calorie malnutrition  Possible adrenal mass Soft tissue mass at greater curvature of stomach  Tobacco abuse  Ashlee [name] is a 79 yo female admitted for COPD exacerbation currently receiving abx therapy. PCCM consulted for incidental RLL lung nodules on abd/pelvis CT. Had a prolonged discussion 6/14 with the patient and daughter, Kenney Houseman, about the diagnostic procedures involved in the pursuit of identification of these lung nodules and potential course of action if the nodules were malignant. Due to the limited functional ability d/t her advanced lung disease the complications of biopsy, either CT guided or endobronchial may pose considerable risk. The patient states she needs a moderate level of assistance with ADLs such as bathing and using the  bathroom.The patient and daughter Kenney Houseman) stated that even if the nodules would be found to be malignant that she would not desire chemotherapy or radiation therapy. Given her limited functional ability I am not confident she would be a good chemotherapy candidate even if she were to desire it. Patient and daughter were agreeable to a PET scan on an outpatient basis after she has recovered from her current COPD exacerbation. This may be ideal as the PET scan would provide additional information about the other suspicious lesions revealed on the CT.   Plan -Cont current rx for COPD -Added Wellbutrin for smoking cessation  -F/u our office (appointments made) -We can set up PET scan as out pt when she has recovered from her current illness should she still want to entertain this.  -I would consider holding off on EUS to eval lesion in abd (at least until after PET) as pt not likely to want to pursue any rx anyway.  -Will f/u with Dr Melvyn Novas on 6/22 as post-hospital f/u. If still interested we could consider setting up PET scan at that point and results could be discussed w/ her annual visit w/ Dr Joya Gaskins on Aug 1 at 10am.  -we will s/o for now.    Erick Colace ACNP-BC [place] Pager # [phone] OR # [phone] if no answer 06/25/2014, 1:10 PM   Attending Note:  I have examined patient, reviewed labs, studies and notes. I have discussed the case with Jerrye Bushy, and I agree with the data and plans as amended above.  Baltazar Apo, MD, PhD 06/27/2014, 5:36 PM [facility] [phone] or if no answer [phone]

## 2014-06-25 NOTE — Op Note (Signed)
[facility] [place], [address]   ENDOSCOPY PROCEDURE REPORT  PATIENT: Ashlee [name], Ashlee [name]  MR#: [id number] BIRTHDATE: [birth year] , 31  yrs. old GENDER: female ENDOSCOPIST: Milus Banister, MD PROCEDURE DATE:  06/25/2014 PROCEDURE:  EGD w/ biopsy ASA CLASS:     Class IV INDICATIONS:  weight loss, COPD, abnormal mass in or adjacent to stomach on CT sca; also nodularity RLL. MEDICATIONS: Monitored anesthesia care TOPICAL ANESTHETIC: none  DESCRIPTION OF PROCEDURE: After the risks benefits and alternatives of the procedure were thoroughly explained, informed consent was obtained.  The [place] [id number] endoscope was introduced through the mouth and advanced to the second portion of the duodenum , Without limitations.  The instrument was slowly withdrawn as the mucosa was fully examined.  There was a small to medium amount of retained solid food in the stomach.  There was mild, non-specific pangastritis.  The stomach was biopsied distally and sent to pathology.  There was a 1cm hiatal hernia.  The examination was otherwise normal.  No mass lesions within the stomach.  Retroflexed views revealed no abnormalities.     The scope was then withdrawn from the patient and the procedure completed. COMPLICATIONS: There were no immediate complications.  ENDOSCOPIC IMPRESSION: There was a small to medium amount of retained solid food in the stomach.  There was mild, non-specific pangastritis.  The stomach was biopsied distally and sent to pathology.  There was a 1cm hiatal hernia.  The examination was otherwise normal.  No mass lesions within the stomach  RECOMMENDATIONS: If biopsies show H.  pylori, she will be started on appropriate antibiotics.  I recommend pulmonary input for abnormal RLL on CT scan chest, weight loss.  My office will schedule EUS to evaluate, sample the lesion noted on CT (adjacent to the stomach).  This will be next week likely.   She will not need to remain in hospital until the and it will also allow further recovery for her COPD, pneumonia, pulmonary workup.   eSigned:  Milus Banister, MD 06/25/2014 10:59 AM    CC: Silvano Rusk; MD

## 2014-06-25 NOTE — Anesthesia Postprocedure Evaluation (Signed)
  Anesthesia Post-op Note  Patient: Ashlee [name]  Procedure(s) Performed: Procedure(s) (LRB): ESOPHAGOGASTRODUODENOSCOPY (EGD) WITH PROPOFOL (N/A)  Patient Location: PACU  Anesthesia Type: MAC  Level of Consciousness: awake and alert   Airway and Oxygen Therapy: Patient Spontanous Breathing  Post-op Pain: mild  Post-op Assessment: Post-op Vital signs reviewed, Patient's Cardiovascular Status Stable, Respiratory Function Stable, Patent Airway and No signs of Nausea or vomiting  Last Vitals:  Filed Vitals:   06/25/14 1359  BP: 116/55  Pulse:   Temp: 36.6 C  Resp: 20    Post-op Vital Signs: stable   Complications: No apparent anesthesia complications

## 2014-06-25 NOTE — Interval H&P Note (Signed)
History and Physical Interval Note:  06/25/2014 10:38 AM  Ashlee [name]  has presented today for surgery, with the diagnosis of soft tissue density greater curvature  The various methods of treatment have been discussed with the patient and family. After consideration of risks, benefits and other options for treatment, the patient has consented to  Procedure(s): ESOPHAGOGASTRODUODENOSCOPY (EGD) WITH PROPOFOL (N/A) as a surgical intervention .  The patient's history has been reviewed, patient examined, no change in status, stable for surgery.  I have reviewed the patient's chart and labs.  Questions were answered to the patient's satisfaction.     Milus Banister

## 2014-06-25 NOTE — Transfer of Care (Signed)
Immediate Anesthesia Transfer of Care Note  Patient: Ashlee [name]  Procedure(s) Performed: Procedure(s): ESOPHAGOGASTRODUODENOSCOPY (EGD) WITH PROPOFOL (N/A)  Patient Location: endoscopy  Anesthesia Type:MAC  Level of Consciousness:  sedated, patient cooperative and responds to stimulation  Airway & Oxygen Therapy:Patient Spontanous Breathing and Patient connected to face mask oxgen  Post-op Assessment:  Report given to endo RN,Post -op Vital signs reviewed and stable  Post vital signs:  Reviewed and stable  Last Vitals:  Filed Vitals:   06/25/14 1102  BP: 132/60  Pulse:   Temp:   Resp: 26    Complications: No apparent anesthesia complications

## 2014-06-25 NOTE — Anesthesia Preprocedure Evaluation (Addendum)
Anesthesia Evaluation  Patient identified by MRN, date of birth, ID band Patient awake    Reviewed: Allergy & Precautions, H&P , NPO status , Patient's Chart, lab work & pertinent test results, reviewed documented beta blocker date and time   Airway Mallampati: II  TM Distance: >3 FB Neck ROM: full    Dental  (+) Edentulous Upper, Edentulous Lower, Dental Advisory Given   Pulmonary asthma , COPD COPD inhaler, Current Smoker,  History chronic respiratory failure breath sounds clear to auscultation  Pulmonary exam normal       Cardiovascular Exercise Tolerance: Good hypertension, Pt. on medications Normal cardiovascular examRhythm:regular Rate:Normal     Neuro/Psych Seizures -, Well Controlled,  Carotid stenosis. polyneuropathy negative psych ROS   GI/Hepatic negative GI ROS, Neg liver ROS,   Endo/Other  negative endocrine ROS  Renal/GU negative Renal ROS  negative genitourinary   Musculoskeletal   Abdominal   Peds  Hematology negative hematology ROS (+)   Anesthesia Other Findings   Reproductive/Obstetrics negative OB ROS                            Anesthesia Physical Anesthesia Plan  ASA: IV  Anesthesia Plan: MAC   Post-op Pain Management:    Induction:   Airway Management Planned:   Additional Equipment:   Intra-op Plan:   Post-operative Plan:   Informed Consent: I have reviewed the patients History and Physical, chart, labs and discussed the procedure including the risks, benefits and alternatives for the proposed anesthesia with the patient or authorized representative who has indicated his/her understanding and acceptance.   Dental Advisory Given  Plan Discussed with: CRNA and Surgeon  Anesthesia Plan Comments:        Anesthesia Quick Evaluation

## 2014-06-25 NOTE — Telephone Encounter (Signed)
-----   Message from Milus Banister, MD sent at 06/25/2014 11:00 AM EDT ----- She is currrently in patient at [facility].  Needs out patient EUS at [facility] next week, ++ MAC, for perigastric mass.  Should be discharged in 2-3 days from COPD exacerbation, pneumonia.  Thanks
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
## 2014-06-25 NOTE — Progress Notes (Addendum)
Patient Demographics  Ashlee [name], is a 79 y.o. female, DOB - 1935/03/03, JJO:[id number]  Admit date - 06/23/2014   Admitting Physician Quintella Baton, MD  Outpatient Primary MD for the patient is ASRES,ALEHEGN, MD  LOS - 1   Chief Complaint  Patient presents with  . Weight Loss       Admission HPI/Brief narrative: 79 year old female with known history of COPD, admitted for COPD treatment, endorses significant weight loss, CT abdomen significant for soft mass in stomach, 2 lung nodules, and adrenal nodule suspicious for malignancy. Endoscopy 06/25/14 showing no mass in the stomach.  Subjective:   [name] today has, No headache, No chest pain, No abdominal pain - No Nausea, No new weakness tingling or numbness, patient reports shortness of breath and cough significantly improved.  Assessment & Plan    Active Problems:   Hyperlipidemia   Essential hypertension   Hypokalemia   COPD with acute exacerbation   Tobacco abuse   Chronic respiratory failure   Weight loss   FTT (failure to thrive) in adult   Malnutrition of moderate degree   Gastric mass  COPD exacerbation - Patient on IV steroid,  as well on Rocephin and azithromycin giving her productive green putum, continue with nebs as needed, pulmonary toilet, oxygen as needed. Will taper her IV steroids, hopefully she can be tapered to oral prednisone tomorrow, as she had significant improvement of her wheezing and cough.  Abnormal finding in CT chest abdomen pelvis suspicious for malignancy - Agent reports weight loss, imaging significant for right lower lung nodularity, and adrenal nodule, and a questionable soft mass in the stomach. - EGD done today does not show any significant mass, patient will have follow-up with GI as an outpatient with EUS to evaluate, sample the lesion noted on CT (adjacent to the stomach) - Pulmonary consulted to  evaluate on the lung nodule - Follow-up with PCP as an outpatient regarding adrenal nodule  Leukocytosis - Unclear etiology, negative blood cultures, negative urinalysis, no evidence of pneumonia on imaging, remains a febrile.   Chronic respiratory failure -Continue with oxygen  Hypokalemia - Repleted,   Hyperlipidemia -Continue with statin  Alzheimer's dementia - Mild, stable  Code Status: DNR  Family Communication: Family at bedside  Disposition Plan: Hopefully in 24 hour if remains stable   Procedures  Endoscopy 06/25/14  Consults   Pulmonary Gastroenterology Medications  Scheduled Meds: . amLODipine  5 mg Oral Daily  . azithromycin  500 mg Intravenous Q24H  . cefTRIAXone (ROCEPHIN)  IV  1 g Intravenous Q24H  . feeding supplement (RESOURCE BREEZE)  1 Container Oral TID BM  . guaiFENesin  600 mg Oral BID  . heparin  5,000 Units Subcutaneous 3 times per day  . lactose free nutrition  237 mL Oral TID WC  . methylPREDNISolone (SOLU-MEDROL) injection  60 mg Intravenous 3 times per day  . nicotine  14 mg Transdermal Daily  . pantoprazole  40 mg Oral Daily  . polyethylene glycol  17 g Oral BID  . pravastatin  80 mg Oral q1800   Continuous Infusions: . sodium chloride    . lactated ringers 1,000 mL (06/25/14 1039)   PRN Meds:.fentaNYL (SUBLIMAZE) injection, ipratropium-albuterol  DVT Prophylaxis   Heparin  Lab Results  Component Value Date   PLT 234 06/25/2014    Antibiotics    Anti-infectives    Start     Dose/Rate Route Frequency Ordered Stop   06/24/14 2200  azithromycin (ZITHROMAX) 500 mg in dextrose 5 % 250 mL IVPB     500 mg 250 mL/hr over 60 Minutes Intravenous Every 24 hours 06/24/14 0524     06/24/14 2200  cefTRIAXone (ROCEPHIN) 1 g in dextrose 5 % 50 mL IVPB - Premix     1 g 100 mL/hr over 30 Minutes Intravenous Every 24 hours 06/24/14 0524     06/24/14 0015  cefTRIAXone (ROCEPHIN) 1 g in dextrose 5 % 50 mL IVPB     1 g 100 mL/hr over 30  Minutes Intravenous  Once 06/24/14 0000 06/24/14 0048   06/24/14 0000  azithromycin (ZITHROMAX) 500 mg in dextrose 5 % 250 mL IVPB     500 mg 250 mL/hr over 60 Minutes Intravenous  Once 06/23/14 2358 06/24/14 0139          Objective:   Filed Vitals:   06/25/14 0525 06/25/14 1035 06/25/14 1102 06/25/14 1111  BP: 134/63  132/60 138/82  Pulse: 86 112    Temp: 98.2 F (36.8 C) 98.5 F (36.9 C)    TempSrc: Oral Oral    Resp: 22 35 26 23  Height:      Weight:      SpO2: 96% 98% 100% 100%    Wt Readings from Last 3 Encounters:  06/24/14 51.256 kg (113 lb)  04/10/14 6.169 kg (13 lb 9.6 oz)  12/07/13 58.8 kg (129 lb 10.1 oz)     Intake/Output Summary (Last 24 hours) at 06/25/14 1207 Last data filed at 06/25/14 1059  Gross per 24 hour  Intake    500 ml  Output    250 ml  Net    250 ml     Physical Exam  Awake Alert, Oriented , No new F.N deficits, Normal affect [place].AT,PERRAL Supple Neck,No JVD, No cervical lymphadenopathy appriciated.  Symmetrical Chest wall movement, decreased air movement bilaterally, wheezing much improved RRR,No Gallops,Rubs or new Murmurs, No Parasternal Heave +ve B.Sounds, Abd Soft, No tenderness, No organomegaly appriciated, No rebound - guarding or rigidity. No Cyanosis, Clubbing or edema, No new Rash or bruise     Data Review   Micro Results Recent Results (from the past 240 hour(s))  Urine culture     Status: None (Preliminary result)   Collection Time: 06/24/14 12:12 AM  Result Value Ref Range Status   Specimen Description URINE, CLEAN CATCH  Final   Special Requests NONE  Final   [place] Count   Final    >=100,000 COLONIES/ML Performed at [facility]    Culture   Final    [place] Performed at [facility]    Report Status PENDING  Incomplete  Culture, blood (routine x 2)     Status: None (Preliminary result)   Collection Time: 06/24/14  7:00 AM  Result Value Ref Range Status   Specimen Description  BLOOD LEFT HAND  Final   Special Requests BOTTLES DRAWN AEROBIC ONLY 5CC  Final   Culture   Final           BLOOD CULTURE RECEIVED NO GROWTH TO DATE CULTURE WILL BE HELD FOR 5 DAYS BEFORE ISSUING A FINAL NEGATIVE REPORT Performed at [facility]    Report Status PENDING  Incomplete  Culture, blood (routine x 2)  Status: None (Preliminary result)   Collection Time: 06/24/14  7:10 AM  Result Value Ref Range Status   Specimen Description BLOOD LEFT ARM  Final   Special Requests BOTTLES DRAWN AEROBIC AND ANAEROBIC 10CC  Final   Culture   Final           BLOOD CULTURE RECEIVED NO GROWTH TO DATE CULTURE WILL BE HELD FOR 5 DAYS BEFORE ISSUING A FINAL NEGATIVE REPORT Performed at [facility]    Report Status PENDING  Incomplete    Radiology Reports Dg Chest 2 View  06/23/2014   CLINICAL DATA:  Constipation for 2 weeks. Headache. Shortness of breath and congestion. Initial encounter.  EXAM: CHEST  2 VIEW  COMPARISON:  Chest radiograph performed 12/04/2013  FINDINGS: The lungs are hyperexpanded, with flattening of the hemidiaphragms, compatible with COPD. Vascular congestion is noted. Minimal bilateral atelectasis is seen. There is no evidence of pleural effusion or pneumothorax.  The heart is borderline normal in size. No acute osseous abnormalities are seen.  IMPRESSION: Vascular congestion noted. Minimal bilateral atelectasis seen. Findings of COPD.   Electronically Signed   By: Garald Balding M.D.   On: 06/23/2014 23:27   Ct Head Wo Contrast  06/24/2014   CLINICAL DATA:  Headaches for several weeks. Confusion. History of hypertension and seizure disorder.  EXAM: CT HEAD WITHOUT CONTRAST  TECHNIQUE: Contiguous axial images were obtained from the base of the skull through the vertex without intravenous contrast.  COMPARISON:  07/25/2008  FINDINGS: Examination is technically limited due to motion artifact. Diffuse cerebral atrophy most prominent in the frontal and temporal regions.  Ventricular dilatation likely due to central atrophy but asymmetrically more prominent on the left. This is unchanged. Low-attenuation changes in the deep white matter consistent with small vessel ischemia. No mass effect or midline shift. No abnormal extra-axial fluid collections. Gray-white matter junctions are distinct. Basal cisterns are not effaced. No evidence of acute intracranial hemorrhage. No depressed skull fractures. Visualized paranasal sinuses and mastoid air cells are not opacified.  IMPRESSION: No acute intracranial abnormalities. Chronic atrophy and small vessel ischemic changes.   Electronically Signed   By: Lucienne Capers M.D.   On: 06/24/2014 02:47   Ct Chest Wo Contrast  06/25/2014   CLINICAL DATA:  Cough, low-grade fever, shortness of breath. Pulmonary nodules.  EXAM: CT CHEST WITHOUT CONTRAST  TECHNIQUE: Multidetector CT imaging of the chest was performed following the standard protocol without IV contrast.  COMPARISON:  Chest radiograph 06/23/2014, CT abdomen/pelvis 06/24/2014  FINDINGS: Mediastinum/Nodes: Heart size is normal. Extensive atheromatous aortic and coronary arterial calcification with probable coronary arterial stents. No pericardial effusion. No lymphadenopathy allowing for noncontrast technique. Thyroid is inhomogeneous without measurable mass.  Lungs/Pleura: Diffuse emphysematous change is identified. Curvilinear right middle lobe, lingular, and right lower lobe presumed scarring is noted. Dominant 1 cm right lower lobe pulmonary parenchymal nodule is identified image 44. Mild bibasilar bronchial wall thickening. The previously seen 1.7 cm right lower lobe nodule is partly obscured within presumed right lower lobe superimposed atelectasis, image 48. No other pulmonary nodules, masses, or other consolidation is identified. Central airways are patent.  Upper abdomen: Although the 3.1 x 1.9 cm low-density right adrenal mass may represent an adenoma given low-density, there  is an apparent soft tissue component measuring 0.8 cm image 52. Cholecystectomy clips are noted. The previously seen mass adjacent to the stomach is likely not included in the imaging field of view. Splenule reidentified. 5 mm omental nodule is noted image  64. Extensive vascular calcification reidentified.  Musculoskeletal: Bones is subjectively osteopenic. Patient is rotated to the right. No acute osseous abnormality. Patient is kyphotic without focal compression deformity.  IMPRESSION: Right lower lobe pulmonary nodularity reidentified with areas of superimposed bibasilar scarring and right lower lobe atelectasis. This could represent metastatic disease but could be more specifically evaluated at PET-CT.  Emphysema.  No other evidence for intrathoracic metastatic disease.  Right adrenal mass with imaging features that could suggest adenoma although the presence of a soft tissue nodule raises the question of collision tumor and metastatic disease.   Electronically Signed   By: Conchita Paris M.D.   On: 06/25/2014 09:24   Ct Abdomen Pelvis W Contrast  06/24/2014   CLINICAL DATA:  Constipation for 2 weeks.  White cell count 21.9.  EXAM: CT ABDOMEN AND PELVIS WITH CONTRAST  TECHNIQUE: Multidetector CT imaging of the abdomen and pelvis was performed using the standard protocol following bolus administration of intravenous contrast.  CONTRAST:  28m OMNIPAQUE IOHEXOL 300 MG/ML  SOLN  COMPARISON:  None.  FINDINGS: Two nodules in the right lung base, measuring 10 and 17 mm, respectively. This is worrisome for metastasis. Suggest a follow-up with CT chest for further evaluation. Small esophageal hiatal hernia.  Surgical absence of the gallbladder. The liver, spleen, pancreas, kidneys, abdominal aorta, and inferior vena cava are unremarkable. Right adrenal gland nodule measures 3.5 by 1.9 cm. Density measurements are indeterminate and metastasis is not excluded. There is a soft tissue nodule adjacent to the greater  curvature of the stomach measuring about 2.4 x 1.8 cm. This could represent a gastric mass such is gastrointestinal stromal tumor or it could represent adjacent enlarged lymph node or peritoneal deposit. The stomach and small bowel are mostly decompressed. Contrast material flows through to the colon without evidence of bowel obstruction. Diffusely stool-filled colon. No free air or free fluid in the abdomen. Abdominal wall musculature appears intact. Scarring in the abdomen consistent with postoperative change. Prominent atherosclerotic calcification throughout the abdominal aorta. There is likely to be significant stenosis of the distal aorta and proximal iliac arteries. Small accessory spleen.  Pelvis: Appendix is surgically absent. Uterus and ovaries are not enlarged. Calcification in the uterus, likely a fibroid. Bladder wall is not thickened. No free or loculated pelvic fluid collections. No pelvic mass or lymphadenopathy. Degenerative changes in the spine. No destructive bone lesions. Lumbar scoliosis convex towards the right.  IMPRESSION: Two right lung nodules, largest measuring 17 mm. 3.5 cm right adrenal gland nodule. Soft tissue density in or along the greater curvature of the stomach. Metastatic disease is suspected. Small esophageal hiatal hernia.   Electronically Signed   By: WLucienne CapersM.D.   On: 06/24/2014 03:05     CBC  Recent Labs Lab 06/23/14 2351 06/24/14 0710 06/25/14 0511  WBC 21.9* 19.6* 19.4*  HGB 15.8* 13.6 12.7  HCT 44.4 39.7 38.0  PLT 222 211 234  MCV 94.5 94.7 96.9  MCH 33.6 32.5 32.4  MCHC 35.6 34.3 33.4  RDW 13.6 13.7 14.3  LYMPHSABS 1.5  --   --   MONOABS 1.3*  --   --   EOSABS 0.0  --   --   BASOSABS 0.1  --   --     Chemistries   Recent Labs Lab 06/23/14 2349 06/23/14 2351 06/24/14 0710 06/25/14 0511  NA  --  133* 135 139  K  --  2.9* 3.5 4.5  CL  --  90* 99* 104  CO2  --  '31 26 27  '$ GLUCOSE  --  120* 188* 158*  BUN  --  '12 9 18  '$ CREATININE   --  0.93 0.85 1.19*  CALCIUM  --  9.2 8.5* 8.8*  MG  --   --  1.7  --   AST 35  --   --   --   ALT 21  --   --   --   ALKPHOS 106  --   --   --   BILITOT 1.2  --   --   --    ------------------------------------------------------------------------------------------------------------------ estimated creatinine clearance is 32.6 mL/min (by C-G formula based on Cr of 1.19). ------------------------------------------------------------------------------------------------------------------ No results for input(s): HGBA1C in the last 72 hours. ------------------------------------------------------------------------------------------------------------------ No results for input(s): CHOL, HDL, LDLCALC, TRIG, CHOLHDL, LDLDIRECT in the last 72 hours. ------------------------------------------------------------------------------------------------------------------  Recent Labs  06/23/14 2351  TSH 0.692   ------------------------------------------------------------------------------------------------------------------ No results for input(s): VITAMINB12, FOLATE, FERRITIN, TIBC, IRON, RETICCTPCT in the last 72 hours.  Coagulation profile No results for input(s): INR, PROTIME in the last 168 hours.  No results for input(s): DDIMER in the last 72 hours.  Cardiac Enzymes  Recent Labs Lab 06/23/14 2351  TROPONINI 0.03   ------------------------------------------------------------------------------------------------------------------ Invalid input(s): POCBNP     Time Spent in minutes   25 minutes   ELGERGAWY, DAWOOD M.D on 06/25/2014 at 12:07 PM  Between 7am to 7pm - Pager - [phone]  After 7pm go to [email or web address] - password [facility]  [facility]   Office  [phone]

## 2014-06-26 ENCOUNTER — Other Ambulatory Visit: Payer: Self-pay

## 2014-06-26 ENCOUNTER — Encounter (HOSPITAL_COMMUNITY): Payer: Self-pay | Admitting: Gastroenterology

## 2014-06-26 DIAGNOSIS — J441 Chronic obstructive pulmonary disease with (acute) exacerbation: Principal | ICD-10-CM

## 2014-06-26 DIAGNOSIS — R918 Other nonspecific abnormal finding of lung field: Secondary | ICD-10-CM

## 2014-06-26 DIAGNOSIS — K3189 Other diseases of stomach and duodenum: Secondary | ICD-10-CM

## 2014-06-26 DIAGNOSIS — I10 Essential (primary) hypertension: Secondary | ICD-10-CM

## 2014-06-26 DIAGNOSIS — E876 Hypokalemia: Secondary | ICD-10-CM

## 2014-06-26 DIAGNOSIS — K319 Disease of stomach and duodenum, unspecified: Secondary | ICD-10-CM

## 2014-06-26 DIAGNOSIS — J9611 Chronic respiratory failure with hypoxia: Secondary | ICD-10-CM

## 2014-06-26 LAB — BASIC METABOLIC PANEL
Anion gap: 11 (ref 5–15)
BUN: 22 mg/dL — ABNORMAL HIGH (ref 6–20)
CALCIUM: 8.8 mg/dL — AB (ref 8.9–10.3)
CHLORIDE: 101 mmol/L (ref 101–111)
CO2: 28 mmol/L (ref 22–32)
CREATININE: 1.04 mg/dL — AB (ref 0.44–1.00)
GFR calc Af Amer: 59 mL/min — ABNORMAL LOW (ref >60)
GFR calc non Af Amer: 51 mL/min — ABNORMAL LOW (ref >60)
Glucose, Bld: 134 mg/dL — ABNORMAL HIGH (ref 65–99)
Potassium: 4.4 mmol/L (ref 3.5–5.1)
Sodium: 140 mmol/L (ref 135–145)

## 2014-06-26 LAB — CBC
HCT: 36.4 % (ref 36.0–46.0)
HEMOGLOBIN: 11.9 g/dL — AB (ref 12.0–15.0)
MCH: 31.6 pg (ref 26.0–34.0)
MCHC: 32.7 g/dL (ref 30.0–36.0)
MCV: 96.8 fL (ref 78.0–100.0)
Platelets: 214 10*3/uL (ref 150–400)
RBC: 3.76 MIL/uL — ABNORMAL LOW (ref 3.87–5.11)
RDW: 14.4 % (ref 11.5–15.5)
WBC: 15.3 10*3/uL — ABNORMAL HIGH (ref 4.0–10.5)

## 2014-06-26 MED ORDER — BUPROPION HCL ER (SR) 150 MG PO TB12: 150.0000 mg | ORAL_TABLET | Freq: Two times a day (BID) | ORAL | Status: AC

## 2014-06-26 MED ORDER — DOXYCYCLINE HYCLATE 100 MG PO TABS: 100.0000 mg | ORAL_TABLET | Freq: Two times a day (BID) | ORAL | Status: DC

## 2014-06-26 MED ORDER — AZITHROMYCIN 500 MG PO TABS
500.0000 mg | ORAL_TABLET | Freq: Every day | ORAL | Status: DC
  Filled 2014-06-26: qty 1

## 2014-06-26 MED ORDER — GUAIFENESIN ER 600 MG PO TB12: 1200.0000 mg | ORAL_TABLET | Freq: Two times a day (BID) | ORAL | Status: DC

## 2014-06-26 MED ORDER — NICOTINE 21 MG/24HR TD PT24: 21.0000 mg | MEDICATED_PATCH | Freq: Every day | TRANSDERMAL | Status: AC

## 2014-06-26 MED ORDER — BOOST PLUS PO LIQD: 237.0000 mL | Freq: Three times a day (TID) | ORAL | Status: AC

## 2014-06-26 MED ORDER — BOOST / RESOURCE BREEZE PO LIQD: 1.0000 | Freq: Three times a day (TID) | ORAL | Status: DC

## 2014-06-26 MED ORDER — IPRATROPIUM-ALBUTEROL 0.5-2.5 (3) MG/3ML IN SOLN: 3.0000 mL | RESPIRATORY_TRACT | Status: DC | PRN

## 2014-06-26 MED ORDER — PREDNISONE 20 MG PO TABS: 20.0000 mg | ORAL_TABLET | Freq: Every day | ORAL | Status: DC

## 2014-06-26 NOTE — Progress Notes (Signed)
PHARMACIST - PHYSICIAN COMMUNICATION DR:   Grandville Silos CONCERNING: Antibiotic IV to Oral Route Change Policy  RECOMMENDATION: This patient is receiving azithromycin by the intravenous route.  Based on criteria approved by the Pharmacy and Therapeutics Committee, the antibiotic(s) is/are being converted to the equivalent oral dose form(s).   DESCRIPTION: These criteria include:  Patient being treated for a respiratory tract infection, urinary tract infection, cellulitis or clostridium difficile associated diarrhea if on metronidazole  The patient is not neutropenic and does not exhibit a GI malabsorption state  The patient is eating (either orally or via tube) and/or has been taking other orally administered medications for a least 24 hours  The patient is improving clinically and has a Tmax < 100.5  If you have questions about this conversion, please contact the Pharmacy Department  '[]'$   [phone] )  [facility] '[]'$   [phone] )  [facility] '[]'$   [phone] )  [facility] '[]'$   [phone] )  [facility] '[x]'$   [phone] )  [place], PharmD, BCPS 06/26/2014 8:49 AM

## 2014-06-26 NOTE — Discharge Summary (Signed)
Physician Discharge Summary  Ashlee [name] KGM:[id number] DOB: [birth year] DOA: 06/23/2014  PCP: Wallene Dales, MD  Admit date: 06/23/2014 Discharge date: 06/26/2014  Time spent: 65 minutes  Recommendations for Outpatient Follow-up:  1. Follow-up with ASRES,ALEHEGN, MD in 1 week. On follow up patient will need a basic metabolic profile done to follow-up on electrolytes and renal function. 2. Patient will follow-up with Dr. Melvyn Novas of pulmonary as scheduled for further evaluation and management of lung nodules noted on CT scan. 3. Patient will follow-up with Dr. Joya Gaskins for her yearly annual visit as scheduled 08/12/2014.  4. Patient will follow-up with Dr. Ardis Hughs gastroenterology as outpatient to be scheduled for a EUS. Patient will be called with appointment time.  Discharge Diagnoses:  Principal Problem:   Acute exacerbation of chronic obstructive pulmonary disease (COPD) Active Problems:   Hyperlipidemia   Essential hypertension   Hypokalemia   COPD with acute exacerbation   Tobacco abuse   Chronic respiratory failure   Weight loss   FTT (failure to thrive) in adult   Malnutrition of moderate degree   Gastric mass   Lung nodules   Discharge Condition: Stable and improved  Diet recommendation: Regular  Filed Weights   06/24/14 0020  Weight: 51.256 kg (113 lb)    History of present illness:  This is a 79 y/o female who was brought to the ER due to c/o SOB starting a couple days prior to admission. She had been wheezing and coughing. Her cough was productive of yellowish sputum. She does smoke. She denied any fevers, chills, nausea or vomiting. She stated she's been constipated and hasn't had a BM in 2 weeks. Her last colonoscopy was 1 year ago. She has colonoscopy every 2 years due to polyps. Her family reported a 10 pound weight loss over the last month. Her appetite had been poor. She reported chronic dizziness but stated this has been present for 2 years ever since she was started  on blood pressure medications. Approximately 3 months ago her xanax was discontinued, she was on 1 mg TID for 10 years, this was wean down then discontinued. Since it was discontinued the patient has become less sociable, staying in her room extended periods of time. Her appetite has also decreased and she has become more depressed.  On day of admission, when brought to the ER she was wheezing, she received nebulizer's. Her SOB improved but she was still hypoxic. The hospitalist was asked to admit.  Hospital Course:  #1 acute COPD exacerbation Patient was admitted with an acute COPD exacerbation. Patient was placed on IV steroids, nebulizer treatments, empiric IV Rocephin and azithromycin given her productive sputum, pulmonary toilet. Patient improved clinically and will be discharged on a prednisone taper, several days of doxycycline to complete a course of antibiotics therapy, nebulizer treatments. Patient will follow-up with PCP in pulmonary as outpatient. Wellbutrin was added to patient's regimen for smoking cessation. Patient be discharged in stable and improved condition.  #2 abnormal CT scan of the chest/abdomen soft pelvis suspicious for malignancy/gastric mass/lung nodules Patient had reported some weight loss. Imaging which was done was concerning for right lower lung nodularity and adrenal nodule and a questionable soft mass in the abdomen. Patient was seen in consultation by Dr. Ardis Hughs of gastroenterology and patient underwent an upper endoscopy that did not show any significant mass. CT of the abdomen and pelvis which was done showed a questionable soft mass in the abdomen. It was recommended by gastroenterology that patient be followed up in  the outpatient setting for EUS and further evaluation and management. Patient will be discharged in stable condition. Pulmonary consultation was also obtained secondary to lung nodules. Patient was seen in consultation by Marni Griffon, NP who recommended  further outpatient workup including a PET scan. Outpatient follow-up has been arranged with Dr. Melvyn Novas on 07/03/2014 and with Dr. Joya Gaskins with her and will visit on 08/12/2014 at 10 AM. Patient be discharged in stable condition.  #3 leukocytosis Likely reactive leukocytosis. Patient was pancultured which was negative. Patient was placed empirically on antibiotics secondary to problem #1. Outpatient follow-up.  #4 chronic respiratory failure Patient was maintained on oxygen.  #5 hypokalemia Repleted.  #6 hyperlipidemia Patient was maintained on a statin.  #7 Alzheimer's dementia Remained stable throughout the hospitalization.  Procedures:  CT chest 06/25/2014  Upper endoscopy 06/25/2014  2-D echo 06/24/2014  Consultations:  Gastroenterology: Dr. Ardis Hughs 06/24/2014  Pulmonary: Jerrye Bushy, NP 06/25/2014  Discharge Exam: Filed Vitals:   06/26/14 1420  BP: 142/63  Pulse: 106  Temp: 98.2 F (36.8 C)  Resp: 20    General:NAD Cardiovascular: RRR Respiratory: Fair air movement. Minimal wheezing. No crackles.  Discharge Instructions   Discharge Instructions    Diet general    Complete by:  As directed      Discharge instructions    Complete by:  As directed   STOP SMOKING Follow up with ASRES,ALEHEGN, MD in 1 week. Follow up with GI as scheduled. Follow up with pulmonary as scheduled.     Increase activity slowly    Complete by:  As directed           Discharge Medication List as of 06/26/2014  3:14 PM    START taking these medications   Details  buPROPion (WELLBUTRIN SR) 150 MG 12 hr tablet Take 1 tablet (150 mg total) by mouth 2 (two) times daily. Take daily for 2 days, then take BID starting on Friday 06/28/14, Starting 06/26/2014, Until Discontinued, Print    doxycycline (VIBRA-TABS) 100 MG tablet Take 1 tablet (100 mg total) by mouth 2 (two) times daily. Take for 5 days then stop., Starting 06/26/2014, Until Discontinued, Print    !! feeding supplement,  RESOURCE BREEZE, (RESOURCE BREEZE) LIQD Take 1 Container by mouth 3 (three) times daily between meals., Starting 06/26/2014, Until Discontinued, No Print    ipratropium-albuterol (DUONEB) 0.5-2.5 (3) MG/3ML SOLN Take 3 mLs by nebulization every 4 (four) hours as needed. Use 3 times daily x 4 days, then every 4 hours as needed., Starting 06/26/2014, Until Discontinued, Print    !! lactose free nutrition (BOOST PLUS) LIQD Take 237 mLs by mouth 3 (three) times daily with meals., Starting 06/26/2014, Until Discontinued, No Print    nicotine (NICODERM CQ - DOSED IN MG/24 HOURS) 21 mg/24hr patch Place 1 patch (21 mg total) onto the skin daily., Starting 06/26/2014, Until Discontinued, Print    predniSONE (DELTASONE) 20 MG tablet Take 1-3 tablets (20-60 mg total) by mouth daily with breakfast. Take 3 tablets ('60mg'$ ) 2 times daily x 3 days, then 3 tablets('60mg'$ ) daily x 3 days, then 2 tablets ('40mg'$ ) daily x 3 days, then 1 tablet ('20mg'$ ) daily x 3 days then stop., Starting 06/26/2014,  Until Discontinued, Print     !! - Potential duplicate medications found. Please discuss with provider.    CONTINUE these medications which have CHANGED   Details  guaiFENesin (MUCINEX) 600 MG 12 hr tablet Take 2 tablets (1,200 mg total) by mouth 2 (two) times daily. Take for 5  days then stop., Starting 06/26/2014, Until Discontinued, Print      CONTINUE these medications which have NOT CHANGED   Details  albuterol (PROVENTIL HFA;VENTOLIN HFA) 108 (90 BASE) MCG/ACT inhaler Inhale 1-2 puffs into the lungs every 6 (six) hours as needed for wheezing or shortness of breath., Starting 05/09/2014, Until Discontinued, Normal    amLODipine (NORVASC) 5 MG tablet Take 5 mg by mouth daily. , Until Discontinued, Historical Med    budesonide-formoterol (SYMBICORT) 160-4.5 MCG/ACT inhaler Inhale 2 puffs into the lungs 2 (two) times daily., Starting 03/06/2014, Until Discontinued, Normal    Calcium Carbonate-Vitamin D (CALTRATE 600+D) 600-400  MG-UNIT per tablet Take 1 tablet by mouth 2 (two) times daily.  , Until Discontinued, Historical Med    [employer] NASAL SPRAY 0.05 % nasal spray Place 1 spray into both nostrils as needed for congestion. , Until Discontinued, Historical Med    Cyanocobalamin (VITAMIN B-12 PO) Take 1 tablet by mouth daily., Until Discontinued, Historical Med    hydrochlorothiazide (MICROZIDE) 12.5 MG capsule Take 12.5 mg by mouth daily., Starting 03/18/2014, Until Discontinued, Historical Med    omeprazole (PRILOSEC) 40 MG capsule Take 40 mg by mouth daily as needed (heart burn). , Until Discontinued, Historical Med    polyethylene glycol powder (MIRALAX) powder Take 17 g by mouth daily as needed. constipation, Until Discontinued, Historical Med    pravastatin (PRAVACHOL) 80 MG tablet Take 80 mg by mouth daily. , Until Discontinued, Historical Med    tiotropium (SPIRIVA) 18 MCG inhalation capsule Place 1 capsule (18 mcg total) into inhaler and inhale daily., Starting 02/06/2014, Until Discontinued, Normal    ALPRAZolam (XANAX) 1 MG tablet Take 0.5-1 mg by mouth 3 (three) times daily. 0.5 mg in the morning and at noon, 1 mg at bedtime, Until Discontinued, Historical Med    bisacodyl (EQ WOMANS LAXATIVE) 5 MG EC tablet Take 5 mg by mouth daily as needed for constipation. , Until Discontinued, Historical Med      STOP taking these medications     levofloxacin (LEVAQUIN) 250 MG tablet        Allergies  Allergen Reactions  . Alendronate Sodium     REACTION: indigestion  . Donepezil Hydrochloride     REACTION: SOB, nausea  . Lisinopril     REACTION: elevated creatinine   Follow-up Information    Follow up with Asencion Noble, MD On 08/12/2014.   Specialty:  Pulmonary Disease   Why:  Please follow up with Dr. Joya Gaskins on Monday, August 1st at 10:00am   Contact information:   [place] [address] [phone]       Follow up with Christinia Gully, MD On 07/03/2014.   Specialty:  Pulmonary Disease    Why:  Please follow up with Dr. Melvyn Novas on Wednesday, June 22nd at 3:30pm   Contact information:   [address] [place] [address] [phone]       Follow up with ASRES,ALEHEGN, MD. Schedule an appointment as soon as possible for a visit in 1 week.   Specialty:  Family Medicine   Why:  Please follow up with Dr. Lajoyce Corners on Friday, June 24th at 2:30pm   Contact information:   [address] [place] [address] [phone]        The results of significant diagnostics from this hospitalization (including imaging, microbiology, ancillary and laboratory) are listed below for reference.    Significant Diagnostic Studies: Dg Chest 2 View  06/23/2014   CLINICAL  DATA:  Constipation for 2 weeks. Headache. Shortness of breath and congestion. Initial encounter.  EXAM: CHEST  2 VIEW  COMPARISON:  Chest radiograph performed 12/04/2013  FINDINGS: The lungs are hyperexpanded, with flattening of the hemidiaphragms, compatible with COPD. Vascular congestion is noted. Minimal bilateral atelectasis is seen. There is no evidence of pleural effusion or pneumothorax.  The heart is borderline normal in size. No acute osseous abnormalities are seen.  IMPRESSION: Vascular congestion noted. Minimal bilateral atelectasis seen. Findings of COPD.   Electronically Signed   By: Garald Balding M.D.   On: 06/23/2014 23:27   Ct Head Wo Contrast  06/24/2014   CLINICAL DATA:  Headaches for several weeks. Confusion. History of hypertension and seizure disorder.  EXAM: CT HEAD WITHOUT CONTRAST  TECHNIQUE: Contiguous axial images were obtained from the base of the skull through the vertex without intravenous contrast.  COMPARISON:  07/25/2008  FINDINGS: Examination is technically limited due to motion artifact. Diffuse cerebral atrophy most prominent in the frontal and temporal regions. Ventricular dilatation likely due to central atrophy but asymmetrically more prominent on the left. This is unchanged.  Low-attenuation changes in the deep white matter consistent with small vessel ischemia. No mass effect or midline shift. No abnormal extra-axial fluid collections. Gray-white matter junctions are distinct. Basal cisterns are not effaced. No evidence of acute intracranial hemorrhage. No depressed skull fractures. Visualized paranasal sinuses and mastoid air cells are not opacified.  IMPRESSION: No acute intracranial abnormalities. Chronic atrophy and small vessel ischemic changes.   Electronically Signed   By: Lucienne Capers M.D.   On: 06/24/2014 02:47   Ct Chest Wo Contrast  06/25/2014   CLINICAL DATA:  Cough, low-grade fever, shortness of breath. Pulmonary nodules.  EXAM: CT CHEST WITHOUT CONTRAST  TECHNIQUE: Multidetector CT imaging of the chest was performed following the standard protocol without IV contrast.  COMPARISON:  Chest radiograph 06/23/2014, CT abdomen/pelvis 06/24/2014  FINDINGS: Mediastinum/Nodes: Heart size is normal. Extensive atheromatous aortic and coronary arterial calcification with probable coronary arterial stents. No pericardial effusion. No lymphadenopathy allowing for noncontrast technique. Thyroid is inhomogeneous without measurable mass.  Lungs/Pleura: Diffuse emphysematous change is identified. Curvilinear right middle lobe, lingular, and right lower lobe presumed scarring is noted. Dominant 1 cm right lower lobe pulmonary parenchymal nodule is identified image 44. Mild bibasilar bronchial wall thickening. The previously seen 1.7 cm right lower lobe nodule is partly obscured within presumed right lower lobe superimposed atelectasis, image 48. No other pulmonary nodules, masses, or other consolidation is identified. Central airways are patent.  Upper abdomen: Although the 3.1 x 1.9 cm low-density right adrenal mass may represent an adenoma given low-density, there is an apparent soft tissue component measuring 0.8 cm image 52. Cholecystectomy clips are noted. The previously seen  mass adjacent to the stomach is likely not included in the imaging field of view. Splenule reidentified. 5 mm omental nodule is noted image 64. Extensive vascular calcification reidentified.  Musculoskeletal: Bones is subjectively osteopenic. Patient is rotated to the right. No acute osseous abnormality. Patient is kyphotic without focal compression deformity.  IMPRESSION: Right lower lobe pulmonary nodularity reidentified with areas of superimposed bibasilar scarring and right lower lobe atelectasis. This could represent metastatic disease but could be more specifically evaluated at PET-CT.  Emphysema.  No other evidence for intrathoracic metastatic disease.  Right adrenal mass with imaging features that could suggest adenoma although the presence of a soft tissue nodule raises the question of collision tumor and metastatic disease.   Electronically Signed  By: Conchita Paris M.D.   On: 06/25/2014 09:24   Ct Abdomen Pelvis W Contrast  06/24/2014   CLINICAL DATA:  Constipation for 2 weeks.  White cell count 21.9.  EXAM: CT ABDOMEN AND PELVIS WITH CONTRAST  TECHNIQUE: Multidetector CT imaging of the abdomen and pelvis was performed using the standard protocol following bolus administration of intravenous contrast.  CONTRAST:  55m OMNIPAQUE IOHEXOL 300 MG/ML  SOLN  COMPARISON:  None.  FINDINGS: Two nodules in the right lung base, measuring 10 and 17 mm, respectively. This is worrisome for metastasis. Suggest a follow-up with CT chest for further evaluation. Small esophageal hiatal hernia.  Surgical absence of the gallbladder. The liver, spleen, pancreas, kidneys, abdominal aorta, and inferior vena cava are unremarkable. Right adrenal gland nodule measures 3.5 by 1.9 cm. Density measurements are indeterminate and metastasis is not excluded. There is a soft tissue nodule adjacent to the greater curvature of the stomach measuring about 2.4 x 1.8 cm. This could represent a gastric mass such is gastrointestinal  stromal tumor or it could represent adjacent enlarged lymph node or peritoneal deposit. The stomach and small bowel are mostly decompressed. Contrast material flows through to the colon without evidence of bowel obstruction. Diffusely stool-filled colon. No free air or free fluid in the abdomen. Abdominal wall musculature appears intact. Scarring in the abdomen consistent with postoperative change. Prominent atherosclerotic calcification throughout the abdominal aorta. There is likely to be significant stenosis of the distal aorta and proximal iliac arteries. Small accessory spleen.  Pelvis: Appendix is surgically absent. Uterus and ovaries are not enlarged. Calcification in the uterus, likely a fibroid. Bladder wall is not thickened. No free or loculated pelvic fluid collections. No pelvic mass or lymphadenopathy. Degenerative changes in the spine. No destructive bone lesions. Lumbar scoliosis convex towards the right.  IMPRESSION: Two right lung nodules, largest measuring 17 mm. 3.5 cm right adrenal gland nodule. Soft tissue density in or along the greater curvature of the stomach. Metastatic disease is suspected. Small esophageal hiatal hernia.   Electronically Signed   By: WLucienne CapersM.D.   On: 06/24/2014 03:05    Microbiology: Recent Results (from the past 240 hour(s))  Urine culture     Status: None (Preliminary result)   Collection Time: 06/24/14 12:12 AM  Result Value Ref Range Status   Specimen Description URINE, CLEAN CATCH  Final   Special Requests NONE  Final   Colony Count   Final    >=100,000 COLONIES/ML [facility]formed at [facility]   Culture   Final    [facility]   Report Status PENDING  Incomplete  Culture, blood (routine x 2)     Status: None (Preliminary result)   Collection Time: 06/24/14  7:00 AM  Result Value Ref Range Status   Specimen Description BLOOD LEFT HAND  Final   Special Requests BOTTLES DRAWN AEROBIC ONLY  5CC  Final   Culture   Final           BLOOD CULTURE RECEIVED NO GROWTH TO DATE CULTURE WILL BE HELD FOR 5 DAYS BEFORE ISSUING A FINAL NEGATIVE REPORT Performed at [facility]   Report Status PENDING  Incomplete  Culture, blood (routine x 2)     Status: None (Preliminary result)   Collection Time: 06/24/14  7:10 AM  Result Value Ref Range Status   Specimen Description BLOOD LEFT ARM  Final   Special Requests BOTTLES DRAWN AEROBIC AND  ANAEROBIC 10CC  Final   Culture   Final           BLOOD CULTURE RECEIVED NO GROWTH TO DATE CULTURE WILL BE HELD FOR 5 DAYS BEFORE ISSUING A FINAL NEGATIVE REPORT Performed at [facility]    Report Status PENDING  Incomplete     Labs: Basic Metabolic Panel:  Recent Labs Lab 06/23/14 2351 06/24/14 0710 06/25/14 0511 06/26/14 0515  NA 133* 135 139 140  K 2.9* 3.5 4.5 4.4  CL 90* 99* 104 101  CO2 '31 26 27 28  '$ GLUCOSE 120* 188* 158* 134*  BUN '12 9 18 '$ 22*  CREATININE 0.93 0.85 1.19* 1.04*  CALCIUM 9.2 8.5* 8.8* 8.8*  MG  --  1.7  --   --    Liver Function Tests:  Recent Labs Lab 06/23/14 2349  AST 35  ALT 21  ALKPHOS 106  BILITOT 1.2  PROT 7.5  ALBUMIN 3.7   No results for input(s): LIPASE, AMYLASE in the last 168 hours. No results for input(s): AMMONIA in the last 168 hours. CBC:  Recent Labs Lab 06/23/14 2351 06/24/14 0710 06/25/14 0511 06/26/14 0515  WBC 21.9* 19.6* 19.4* 15.3*  NEUTROABS 18.9*  --   --   --   HGB 15.8* 13.6 12.7 11.9*  HCT 44.4 39.7 38.0 36.4  MCV 94.5 94.7 96.9 96.8  PLT 222 211 234 214   Cardiac Enzymes:  Recent Labs Lab 06/23/14 2351  TROPONINI 0.03   BNP: BNP (last 3 results)  Recent Labs  06/24/14 0700  BNP 187.6*    ProBNP (last 3 results)  Recent Labs  12/04/13 1426  PROBNP 368.9    CBG: No results for input(s): GLUCAP in the last 168 hours.     SignedIrine Seal MD [facility] 06/26/2014, 4:30 PM

## 2014-06-26 NOTE — Progress Notes (Signed)
Everrett Coombe to be D/C'd Home per MD order.  Discussed prescriptions and follow up appointments with the patient. Prescriptions given to patient, medication list explained in detail. Pt verbalized understanding.    Medication List    STOP taking these medications        levofloxacin 250 MG tablet  Commonly known as:  LEVAQUIN      TAKE these medications        albuterol 108 (90 BASE) MCG/ACT inhaler  Commonly known as:  PROVENTIL HFA;VENTOLIN HFA  Inhale 1-2 puffs into the lungs every 6 (six) hours as needed for wheezing or shortness of breath.     ALPRAZolam 1 MG tablet  Commonly known as:  XANAX  Take 0.5-1 mg by mouth 3 (three) times daily. 0.5 mg in the morning and at noon, 1 mg at bedtime     amLODipine 5 MG tablet  Commonly known as:  NORVASC  Take 5 mg by mouth daily.     budesonide-formoterol 160-4.5 MCG/ACT inhaler  Commonly known as:  SYMBICORT  Inhale 2 puffs into the lungs 2 (two) times daily.     buPROPion 150 MG 12 hr tablet  Commonly known as:  WELLBUTRIN SR  Take 1 tablet (150 mg total) by mouth 2 (two) times daily. Take daily for 2 days, then take BID starting on Friday 06/28/14     CALTRATE 600+D 600-400 MG-UNIT per tablet  Generic drug:  Calcium Carbonate-Vitamin D  Take 1 tablet by mouth 2 (two) times daily.     [employer] NASAL SPRAY 0.05 % nasal spray  Generic drug:  oxymetazoline  Place 1 spray into both nostrils as needed for congestion.     doxycycline 100 MG tablet  Commonly known as:  VIBRA-TABS  Take 1 tablet (100 mg total) by mouth 2 (two) times daily. Take for 5 days then stop.     EQ WOMANS LAXATIVE 5 MG EC tablet  Generic drug:  bisacodyl  Take 5 mg by mouth daily as needed for constipation.     feeding supplement (RESOURCE BREEZE) Liqd  Take 1 Container by mouth 3 (three) times daily between meals.     lactose free nutrition Liqd  Take 237 mLs by mouth 3 (three) times daily with meals.     guaiFENesin 600 MG 12 hr tablet  Commonly known  as:  MUCINEX  Take 2 tablets (1,200 mg total) by mouth 2 (two) times daily. Take for 5 days then stop.     hydrochlorothiazide 12.5 MG capsule  Commonly known as:  MICROZIDE  Take 12.5 mg by mouth daily.     ipratropium-albuterol 0.5-2.5 (3) MG/3ML Soln  Commonly known as:  DUONEB  Take 3 mLs by nebulization every 4 (four) hours as needed. Use 3 times daily x 4 days, then every 4 hours as needed.     MIRALAX powder  Generic drug:  polyethylene glycol powder  Take 17 g by mouth daily as needed. constipation     nicotine 21 mg/24hr patch  Commonly known as:  NICODERM CQ - dosed in mg/24 hours  Place 1 patch (21 mg total) onto the skin daily.     omeprazole 40 MG capsule  Commonly known as:  PRILOSEC  Take 40 mg by mouth daily as needed (heart burn).     pravastatin 80 MG tablet  Commonly known as:  PRAVACHOL  Take 80 mg by mouth daily.     predniSONE 20 MG tablet  Commonly known as:  DELTASONE  Take 1-3 tablets (20-60 mg total) by mouth daily with breakfast. Take 3 tablets ('60mg'$ ) 2 times daily x 3 days, then 3 tablets('60mg'$ ) daily x 3 days, then 2 tablets ('40mg'$ ) daily x 3 days, then 1 tablet ('20mg'$ ) daily x 3 days then stop.     tiotropium 18 MCG inhalation capsule  Commonly known as:  SPIRIVA  Place 1 capsule (18 mcg total) into inhaler and inhale daily.     VITAMIN B-12 PO  Take 1 tablet by mouth daily.        Filed Vitals:   06/26/14 1420  BP: 142/63  Pulse: 106  Temp: 98.2 F (36.8 C)  Resp: 20    Skin clean, dry and intact without evidence of skin break down, no evidence of skin tears noted. IV catheter discontinued intact. Site without signs and symptoms of complications. Dressing and pressure applied. Pt denies pain at this time. No complaints noted.  An After Visit Summary was printed and given to the patient. Patient escorted via [place], and D/C home via private auto.  Lolita Rieger 06/26/2014 3:54 PM

## 2014-06-26 NOTE — Telephone Encounter (Signed)
Pt scheduled for 07/04/14 830 am [facility] EUS  I spoke with Cecille Rubin and she put a note in the discharge instructions for the pt.  Instructions have also been mailed to the home.

## 2014-06-26 NOTE — Discharge Instructions (Signed)
You have an appointment Thursday, July 04, 2014 at [facility] for and endoscopic ultrasound. Your appointment is at 7:00. You should be at admitting for this time. Please have nothing to eat or drink after midnight the night before. If you can not keep this appointment, please call [facility] ASAP at [phone], ask for Christian Mate.

## 2014-06-26 NOTE — Care Management Note (Signed)
Case Management Note  Patient Details  Name: VALETTA MULROY MRN: [id number] Date of Birth: 1935/02/25  Subjective/Objective:    79 yo female admitted with acute exacerbation of COPD                Action/Plan:  Patient and daughter chose [facility] for [facility] services, referral communicated to [facility] with [facility], patient also stated that she needed a nebuliazer for home, DME ordered and communicated to [place] with [facility]. Also provided the patient with a list of private duty sitter agencies and the paper work to initiate personal care services through [place]. Explained process to patient and daughter in room. No other questions or concerns. Expected Discharge Date:  06/28/14               Expected Discharge Plan:  [place]  In-House Referral:     Discharge planning Services  CM Consult  Post Acute Care Choice:  Durable Medical Equipment, Home Health Choice offered to:  Patient  DME Arranged:  Chiropodist DME Agency:  [place] Arranged:  PT, OT, Nurse's Aide (SW) [place]:  [place]  Status of Service:  In process, will continue to follow  Medicare Important Message Given:  Yes Date Medicare IM Given:  06/26/14 Medicare IM give by:  Leanne Chang Date Additional Medicare IM Given:    Additional Medicare Important Message give by:     If discussed at [place] of Stay Meetings, dates discussed:    Additional Comments:  Scot Dock, RN 06/26/2014, 12:33 PM

## 2014-06-27 ENCOUNTER — Other Ambulatory Visit: Payer: Self-pay

## 2014-06-27 LAB — URINE CULTURE

## 2014-06-27 MED ORDER — CLARITHROMYCIN 500 MG PO TABS: 500.0000 mg | ORAL_TABLET | Freq: Two times a day (BID) | ORAL | Status: DC

## 2014-06-27 MED ORDER — AMOXICILLIN 500 MG PO TABS: 1000.0000 mg | ORAL_TABLET | Freq: Two times a day (BID) | ORAL | Status: DC

## 2014-06-27 NOTE — Telephone Encounter (Signed)
Prescription sent as ordered and pt is aware.  Meds and allergies have been reviewed.

## 2014-06-28 ENCOUNTER — Encounter (HOSPITAL_COMMUNITY): Payer: Self-pay | Admitting: *Deleted

## 2014-06-30 LAB — CULTURE, BLOOD (ROUTINE X 2)
CULTURE: NO GROWTH
Culture: NO GROWTH

## 2014-07-03 ENCOUNTER — Ambulatory Visit (INDEPENDENT_AMBULATORY_CARE_PROVIDER_SITE_OTHER)
Admission: RE | Admit: 2014-07-03 | Discharge: 2014-07-03 | Disposition: A | Discharge status: Home / Self Care | Payer: Medicare HMO | Source: Ambulatory Visit | Attending: Internal Medicine | Admitting: Internal Medicine

## 2014-07-03 ENCOUNTER — Ambulatory Visit (INDEPENDENT_AMBULATORY_CARE_PROVIDER_SITE_OTHER): Payer: Medicare HMO | Admitting: Internal Medicine

## 2014-07-03 VITALS — BP 138/62 | HR 107

## 2014-07-03 DIAGNOSIS — J449 Chronic obstructive pulmonary disease, unspecified: Secondary | ICD-10-CM

## 2014-07-03 DIAGNOSIS — J9611 Chronic respiratory failure with hypoxia: Secondary | ICD-10-CM

## 2014-07-03 DIAGNOSIS — R918 Other nonspecific abnormal finding of lung field: Secondary | ICD-10-CM | POA: Diagnosis not present

## 2014-07-03 NOTE — Assessment & Plan Note (Signed)
Although there are clearly abnormalities on CT scan, they should probably be considered "microscopic" since not obvious on plain cxr .     In the setting of obvious "macroscopic" health issues,  I am very reluctatnt to embark on an invasive w/u at this point but will arrange consevative  follow up and in the meantime see what we can do to address the patient's subjective concerns.    Depending on results of GI w/u will plan PET at some point but no indication to attempt any kind of lung bx at this point

## 2014-07-03 NOTE — Progress Notes (Signed)
Subjective:    Patient ID: Ashlee [name], female    DOB: 01-Nov-1935, 79 y.o.   MRN: [id number]  Brief patient profile:  78 yowf quit smoking 06/23/14 with GOLD IV copd documented 2013 maint on symb/spiriva s/p admit  Admit date: 06/23/2014 Discharge date: 06/26/2014  Recommendations for Outpatient Follow-up:  1. Follow-up with ASRES,ALEHEGN, MD in 1 week. On follow up patient will need a basic metabolic profile done to follow-up on electrolytes and renal function. 2. Patient will follow-up with Dr. Melvyn Novas of pulmonary as scheduled for further evaluation and management of lung nodules noted on CT scan. 3. Patient will follow-up with Dr. Joya Gaskins for her yearly annual visit as scheduled 08/12/2014.  4. Patient will follow-up with Dr. Ardis Hughs gastroenterology as outpatient to be scheduled for a EUS. Patient will be called with appointment time.  Discharge Diagnoses:  Principal Problem:  Acute exacerbation of chronic obstructive pulmonary disease (COPD)   Hyperlipidemia  Essential hypertension  Hypokalemia  COPD with acute exacerbation  Tobacco abuse  Chronic respiratory failure  Weight loss  FTT (failure to thrive) in adult  Malnutrition of moderate degree  Gastric mass  Lung nodules   Discharge Condition: Stable and improved  Diet recommendation: Regular  Filed Weights   06/24/14 0020  Weight: 51.256 kg (113 lb)    History of present illness:  This is a 79 y/o female who was brought to the ER due to c/o SOB starting a couple days prior to admission. She had been wheezing and coughing. Her cough was productive of yellowish sputum. She does smoke. She denied any fevers, chills, nausea or vomiting. She stated she's been constipated and hasn't had a BM in 2 weeks. Her last colonoscopy was 1 year ago. She has colonoscopy every 2 years due to polyps. Her family reported a 10 pound weight loss over the last month. Her appetite had been poor. She reported chronic  dizziness but stated this has been present for 2 years ever since she was started on blood pressure medications. Approximately 3 months ago her xanax was discontinued, she was on 1 mg TID for 10 years, this was wean down then discontinued. Since it was discontinued the patient has become less sociable, staying in her room extended periods of time. Her appetite has also decreased and she has become more depressed.  On day of admission, when brought to the ER she was wheezing, she received nebulizer's. Her SOB improved but she was still hypoxic. The hospitalist was asked to admit.  Hospital Course:  #1 acute COPD exacerbation Patient was admitted with an acute COPD exacerbation. Patient was placed on IV steroids, nebulizer treatments, empiric IV Rocephin and azithromycin given her productive sputum, pulmonary toilet. Patient improved clinically and will be discharged on a prednisone taper, several days of doxycycline to complete a course of antibiotics therapy, nebulizer treatments. Patient will follow-up with PCP in pulmonary as outpatient. Wellbutrin was added to patient's regimen for smoking cessation. Patient be discharged in stable and improved condition.  #2 abnormal CT scan of the chest/abdomen soft pelvis suspicious for malignancy/gastric mass/lung nodules Patient had reported some weight loss. Imaging which was done was concerning for right lower lung nodularity and adrenal nodule and a questionable soft mass in the abdomen. Patient was seen in consultation by Dr. Ardis Hughs of gastroenterology and patient underwent an upper endoscopy that did not show any significant mass. CT of the abdomen and pelvis which was done showed a questionable soft mass in the abdomen. It was  recommended by gastroenterology that patient be followed up in the outpatient setting for EUS and further evaluation and management. Patient will be discharged in stable condition. Pulmonary consultation was also obtained secondary to  lung nodules. Patient was seen in consultation by Ashlee Griffon, NP who recommended further outpatient workup including a PET scan. Outpatient follow-up has been arranged with Dr. Melvyn Novas on 07/03/2014 and with Dr. Joya Gaskins with her and will visit on 08/12/2014 at 10 AM. Patient be discharged in stable condition.  #3 leukocytosis Likely reactive leukocytosis. Patient was pancultured which was negative. Patient was placed empirically on antibiotics secondary to problem #1. Outpatient follow-up.  #4 chronic respiratory failure Patient was maintained on oxygen.  #5 hypokalemia Repleted.  #6 hyperlipidemia Patient was maintained on a statin.  #7 Alzheimer's dementia Remained stable throughout the hospitalization.  Procedures:  CT chest 06/25/2014  Upper endoscopy 06/25/2014  2-D echo 06/24/2014  Consultations:  Gastroenterology: Dr. Ardis Hughs 06/24/2014  Pulmonary: Ashlee Bushy, NP 06/25/2014             07/03/2014 f/u ov/Ashlee [name] re: GOLD IV COPD/ 02 dep/ MPNs Chief Complaint  Patient presents with  . HFU    Pt states her breathing is doing well. Her appetite is improving. No new co's today.   symb in am / spiriva in pm  Using saba once daily  02 2lpm   No obvious day to day or daytime variabilty or assoc excess or purulent sputum or cp or chest tightness, subjective wheeze overt sinus or hb symptoms. No unusual exp hx or h/o childhood pna/ asthma or knowledge of premature birth.  Sleeping ok without nocturnal  or early am exacerbation  of respiratory  c/o's or need for noct saba. Also denies any obvious fluctuation of symptoms with weather or environmental changes or other aggravating or alleviating factors except as outlined above   Current Medications, Allergies, Complete Past Medical History, Past Surgical History, Family History, and Social History were reviewed in [employer] record.  ROS  The following are not active complaints unless bolded sore  throat, dysphagia, dental problems, itching, sneezing,  nasal congestion or excess/ purulent secretions, ear ache,   fever, chills, sweats, unintended wt loss, pleuritic or exertional cp, hemoptysis,  orthopnea pnd or leg swelling, presyncope, palpitations, abdominal pain, anorexia, nausea, vomiting, diarrhea  or change in bowel or urinary habits, change in stools or urine, dysuria,hematuria,  rash, arthralgias, visual complaints, headache, numbness weakness or ataxia or problems with walking or coordination,  change in mood/affect or memory.               Objective:   Physical Exam  Elderly wf nad / sitting in w/c  Wt Readings from Last 3 Encounters:  06/24/14 113 lb (51.256 kg)  04/10/14 13 lb 9.6 oz (6.169 kg)  12/07/13 129 lb 10.1 oz (58.8 kg)    Vital signs reviewed  Gen: Pleasant, well-nourished, in no distress,  Thin, normal affect  ENT: No lesions,  mouth clear,  oropharynx clear, no postnasal drip - edentulous/ dentures in place   Neck: No JVD, no TMG, no carotid bruits  Lungs: No use of accessory muscles, no dullness to percussion, distant BS  Cardiovascular: RRR, heart sounds normal, no murmur or gallops, no peripheral edema  Abdomen: soft and NT, no HSM,  BS normal  Musculoskeletal: No deformities, no cyanosis or clubbing  Neuro: alert, non focal  Skin: Warm, no lesions or rashes     CXR PA and Lateral:  07/03/2014 :     I personally reviewed images and agree with radiology impression as follows:   COPD with bibasilar scarring/ atelectasis.    I personally reviewed images and agree with radiology impression as follows:  CT  06/25/14  Diffuse emphysematous change is identified. Curvilinear right middle lobe, lingular, and right lower lobe presumed scarring is noted. Dominant 1 cm right lower lobe pulmonary parenchymal nodule is identified image 44. Mild bibasilar bronchial wall thickening. The previously seen 1.7 cm right lower lobe nodule is partly  obscured within presumed right lower lobe superimposed atelectasis, image 48. No other pulmonary nodules, masses, or other consolidation is identified. Central airways are patent.       Assessment & Plan:

## 2014-07-03 NOTE — Assessment & Plan Note (Signed)
Adequate control on present rx, reviewed > no change in rx needed  = 3lpm 24/7 for now

## 2014-07-03 NOTE — Patient Instructions (Signed)
Plan A = Automatic = symbicort 160 Take 2 puffs first thing in am and then another 2 puffs about 12 hours later                                    spiriva each am only  Plan B = Backup - Only use your albuterol (proair) as a rescue medication to be used if you can't catch your breath by resting or doing a relaxed purse lip breathing pattern.  - The less you use it, the better it will work when you need it. - Ok to use up to 2 puffs  every 4 hours if you must but call for immediate appointment if use goes up over your usual need - Don't leave home without it !!  (think of it like the spare tire for your car)   Plan C = crisis Backup for  plan B if not working >>> if not better after your proair, ok to use your nebulizer up to every 4 hours if needed   Please remember to go to the  x-ray department downstairs for your tests - we will call you with the results when they are available.   See Tammy NP w/in 2 weeks with all your medications, even over the counter meds, separated in two separate bags, the ones you take no matter what vs the ones you stop once you feel better and take only as needed when you feel you need them.   Tammy  will generate for you a new user friendly medication calendar that will put [place] all on the same page re: your medication use.     Without this process, it simply isn't possible to assure that we are providing  your outpatient care  with  the attention to detail we feel you deserve.   If we cannot assure that you're getting that kind of care,  then we cannot manage your problem effectively from this clinic.  Once you have seen Tammy and we are sure that we're all on the same page with your medication use she will arrange follow up with me.

## 2014-07-03 NOTE — Assessment & Plan Note (Signed)
-   2013 Arlyce Harman:  FeV1 29%  FeV1/FVC 61%  Fef 25 75  14% -  Quit smoking 06/2014   The proper method of use, as well as anticipated side effects, of a metered-dose inhaler are discussed and demonstrated to the patient. Improved effectiveness after extensive coaching during this visit to a level of approximately  50%   I had an extended discussion with the patient reviewing all relevant studies completed to date and  lasting 15 to 20 minutes of a 25 minute visit on the following ongoing concerns: 1) very poor insight into meds/ how when to use maint  Vs prns 2) .Each maintenance medication was reviewed in detail including most importantly the difference between maintenance and as needed and under what circumstances the prns are to be used.  Please see instructions for details which were reviewed in writing and the patient given a copy.  3) if can't learn hfa better needs to change over completely to mdi

## 2014-07-04 ENCOUNTER — Ambulatory Visit (HOSPITAL_COMMUNITY): Payer: Medicare HMO | Admitting: Anesthesiology

## 2014-07-04 ENCOUNTER — Encounter (HOSPITAL_COMMUNITY): Payer: Self-pay

## 2014-07-04 ENCOUNTER — Ambulatory Visit (HOSPITAL_COMMUNITY)
Admission: RE | Admit: 2014-07-04 | Discharge: 2014-07-04 | Disposition: A | Discharge status: Home / Self Care | Payer: Medicare HMO | Source: Ambulatory Visit | Attending: Gastroenterology | Admitting: Gastroenterology

## 2014-07-04 ENCOUNTER — Encounter (HOSPITAL_COMMUNITY)
Admission: RE | Disposition: A | Discharge status: Home / Self Care | Payer: Self-pay | Source: Ambulatory Visit | Attending: Gastroenterology

## 2014-07-04 DIAGNOSIS — E785 Hyperlipidemia, unspecified: Secondary | ICD-10-CM | POA: Insufficient documentation

## 2014-07-04 DIAGNOSIS — R634 Abnormal weight loss: Secondary | ICD-10-CM | POA: Diagnosis not present

## 2014-07-04 DIAGNOSIS — R630 Anorexia: Secondary | ICD-10-CM | POA: Insufficient documentation

## 2014-07-04 DIAGNOSIS — J449 Chronic obstructive pulmonary disease, unspecified: Secondary | ICD-10-CM | POA: Insufficient documentation

## 2014-07-04 DIAGNOSIS — Z86010 Personal history of colonic polyps: Secondary | ICD-10-CM | POA: Insufficient documentation

## 2014-07-04 DIAGNOSIS — K2970 Gastritis, unspecified, without bleeding: Secondary | ICD-10-CM | POA: Diagnosis not present

## 2014-07-04 DIAGNOSIS — K319 Disease of stomach and duodenum, unspecified: Secondary | ICD-10-CM | POA: Diagnosis present

## 2014-07-04 DIAGNOSIS — I6529 Occlusion and stenosis of unspecified carotid artery: Secondary | ICD-10-CM | POA: Diagnosis not present

## 2014-07-04 DIAGNOSIS — Z9981 Dependence on supplemental oxygen: Secondary | ICD-10-CM | POA: Insufficient documentation

## 2014-07-04 DIAGNOSIS — F17210 Nicotine dependence, cigarettes, uncomplicated: Secondary | ICD-10-CM | POA: Insufficient documentation

## 2014-07-04 DIAGNOSIS — F419 Anxiety disorder, unspecified: Secondary | ICD-10-CM | POA: Diagnosis not present

## 2014-07-04 DIAGNOSIS — F0280 Dementia in other diseases classified elsewhere without behavioral disturbance: Secondary | ICD-10-CM | POA: Diagnosis not present

## 2014-07-04 DIAGNOSIS — G709 Myoneural disorder, unspecified: Secondary | ICD-10-CM | POA: Insufficient documentation

## 2014-07-04 DIAGNOSIS — G309 Alzheimer's disease, unspecified: Secondary | ICD-10-CM | POA: Diagnosis not present

## 2014-07-04 DIAGNOSIS — J45909 Unspecified asthma, uncomplicated: Secondary | ICD-10-CM | POA: Diagnosis not present

## 2014-07-04 DIAGNOSIS — N289 Disorder of kidney and ureter, unspecified: Secondary | ICD-10-CM | POA: Insufficient documentation

## 2014-07-04 DIAGNOSIS — D649 Anemia, unspecified: Secondary | ICD-10-CM | POA: Insufficient documentation

## 2014-07-04 DIAGNOSIS — K3189 Other diseases of stomach and duodenum: Secondary | ICD-10-CM | POA: Diagnosis not present

## 2014-07-04 DIAGNOSIS — Z681 Body mass index (BMI) 19 or less, adult: Secondary | ICD-10-CM | POA: Diagnosis not present

## 2014-07-04 DIAGNOSIS — K219 Gastro-esophageal reflux disease without esophagitis: Secondary | ICD-10-CM | POA: Insufficient documentation

## 2014-07-04 DIAGNOSIS — R918 Other nonspecific abnormal finding of lung field: Secondary | ICD-10-CM | POA: Insufficient documentation

## 2014-07-04 DIAGNOSIS — R569 Unspecified convulsions: Secondary | ICD-10-CM | POA: Diagnosis not present

## 2014-07-04 DIAGNOSIS — Z79899 Other long term (current) drug therapy: Secondary | ICD-10-CM | POA: Diagnosis not present

## 2014-07-04 DIAGNOSIS — I739 Peripheral vascular disease, unspecified: Secondary | ICD-10-CM | POA: Diagnosis not present

## 2014-07-04 DIAGNOSIS — J9610 Chronic respiratory failure, unspecified whether with hypoxia or hypercapnia: Secondary | ICD-10-CM | POA: Insufficient documentation

## 2014-07-04 DIAGNOSIS — I10 Essential (primary) hypertension: Secondary | ICD-10-CM | POA: Insufficient documentation

## 2014-07-04 DIAGNOSIS — Z7951 Long term (current) use of inhaled steroids: Secondary | ICD-10-CM | POA: Diagnosis not present

## 2014-07-04 HISTORY — PX: EUS: SHX5427

## 2014-07-04 SURGERY — UPPER ENDOSCOPIC ULTRASOUND (EUS) LINEAR
Anesthesia: Monitor Anesthesia Care

## 2014-07-04 MED ORDER — PROPOFOL 10 MG/ML IV BOLUS
INTRAVENOUS | Status: AC
  Filled 2014-07-04: qty 20

## 2014-07-04 MED ORDER — PROPOFOL 10 MG/ML IV BOLUS
INTRAVENOUS | Status: DC | PRN
  Administered 2014-07-04 (×3): 50 mg via INTRAVENOUS

## 2014-07-04 MED ORDER — SODIUM CHLORIDE 0.9 % IV SOLN: INTRAVENOUS | Status: DC

## 2014-07-04 MED ORDER — PROPOFOL INFUSION 10 MG/ML OPTIME
INTRAVENOUS | Status: DC | PRN
  Administered 2014-07-04: 120 ug/kg/min via INTRAVENOUS

## 2014-07-04 MED ORDER — LACTATED RINGERS IV SOLN
INTRAVENOUS | Status: DC
  Administered 2014-07-04: 1000 mL via INTRAVENOUS

## 2014-07-04 MED ORDER — LIDOCAINE HCL (CARDIAC) 20 MG/ML IV SOLN
INTRAVENOUS | Status: DC | PRN
  Administered 2014-07-04: 50 mg via INTRAVENOUS

## 2014-07-04 MED ORDER — LIDOCAINE HCL (CARDIAC) 20 MG/ML IV SOLN
INTRAVENOUS | Status: AC
  Filled 2014-07-04: qty 5

## 2014-07-04 NOTE — Op Note (Addendum)
[facility] [place], [id number]   ENDOSCOPIC ULTRASOUND PROCEDURE REPORT  PATIENT: Ashlee, [name]  MR#: [id number] BIRTHDATE: February 02, 1935  GENDER: female ENDOSCOPIST: Milus Banister, MD PROCEDURE DATE:  07/04/2014 PROCEDURE:   Upper EUS w/FNA ASA CLASS:      Class IV INDICATIONS:   1.  abnormal CT scan recently (gastric vs perigastric mass), EGD Dr.  Ardis Hughs in [place] last week found no intraluminal mass, H.  pylori biopsies +, antibiotics were started.Marland Kitchen MEDICATIONS: Monitored anesthesia care DESCRIPTION OF PROCEDURE:   After the risks benefits and alternatives of the procedure were  explained, informed consent was obtained. The patient was then placed in the left, lateral, decubitus postion and IV sedation was administered. Throughout the procedure, the patients blood pressure, pulse and oxygen saturations were monitored continuously.  Under direct visualization, the EUS scope [id number]  endoscope was introduced through the mouth  and advanced to the second portion of the duodenum .  Water was used as necessary to provide an acoustic interface.  Upon completion of the imaging, water was removed and the patient was sent to the recovery room in satisfactory condition.  Endoscopic findings: 1. Mild gastritis, otherwise normal UGI tract  EUS findings: 1. 2.3cm by 2.4cm, well circumscribed, hypoechoic mass that communicates with the muscularis propria layer of the mid gastric wall along the greater curvature. There were no suspicious perigastric lymphnodes. The mass was sampled with five EUS FNA passes (3 with 22 gauge and 2 with 25 guage). 2. Pancreatic parenchyma was normal throughout. 3. Main pancreatic duct was normal. 4.  CBD was normal, non-dilated 5. Limited viwes of liver, spleen, portal and splenic vessels were all normal  ENDOSCOPIC IMPRESSION: 2.3cm by 2.4cm gastric wall mass (communicates with muscularis propria along the greater curvature  of stomach). FNA cytology pending.  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 IF the cytology is non-diagnostic, I will likely recommend a PET scan. This may help direct treatment, further testing for her abd and pulmonary issues (nodules).  RECOMMENDATIONS: Await final cytology results.  _______________________________ eSignedMilus Banister, MD 07/04/2014 9:14 AM Revised: 07/04/2014 9:14 AM  CC: Silvano Rusk MD  PATIENT NAME:  Ashlee, [name] MR#: [id number]

## 2014-07-04 NOTE — Anesthesia Preprocedure Evaluation (Signed)
Anesthesia Evaluation  Patient identified by MRN, date of birth, ID band Patient awake    Reviewed: Allergy & Precautions, NPO status , Patient's Chart, lab work & pertinent test results  Airway Mallampati: II  TM Distance: >3 FB Neck ROM: Full    Dental no notable dental hx.    Pulmonary asthma , COPD COPD inhaler and oxygen dependent, former smoker,  breath sounds clear to auscultation  Pulmonary exam normal       Cardiovascular Exercise Tolerance: Poor hypertension, Pt. on medications + Peripheral Vascular Disease Normal cardiovascular examRhythm:Regular Rate:Normal     Neuro/Psych Seizures -, Well Controlled,  PSYCHIATRIC DISORDERS Anxiety  Neuromuscular disease    GI/Hepatic Neg liver ROS, GERD-  ,  Endo/Other  negative endocrine ROS  Renal/GU Renal disease  negative genitourinary   Musculoskeletal negative musculoskeletal ROS (+)   Abdominal   Peds negative pediatric ROS (+)  Hematology  (+) anemia ,   Anesthesia Other Findings   Reproductive/Obstetrics negative OB ROS                             Anesthesia Physical Anesthesia Plan  ASA: IV  Anesthesia Plan: MAC   Post-op Pain Management:    Induction: Intravenous  Airway Management Planned: Natural Airway  Additional Equipment:   Intra-op Plan:   Post-operative Plan:   Informed Consent: I have reviewed the patients History and Physical, chart, labs and discussed the procedure including the risks, benefits and alternatives for the proposed anesthesia with the patient or authorized representative who has indicated his/her understanding and acceptance.   Dental advisory given  Plan Discussed with: CRNA  Anesthesia Plan Comments:         Anesthesia Quick Evaluation

## 2014-07-04 NOTE — Discharge Instructions (Signed)
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
## 2014-07-04 NOTE — Interval H&P Note (Signed)
History and Physical Interval Note:  07/04/2014 7:25 AM  Ashlee [name]  has presented today for surgery, with the diagnosis of perigastric mass  The various methods of treatment have been discussed with the patient and family. After consideration of risks, benefits and other options for treatment, the patient has consented to  Procedure(s): UPPER ENDOSCOPIC ULTRASOUND (EUS) LINEAR (N/A) as a surgical intervention .  The patient's history has been reviewed, patient examined, no change in status, stable for surgery.  I have reviewed the patient's chart and labs.  Questions were answered to the patient's satisfaction.     Milus Banister

## 2014-07-04 NOTE — Anesthesia Postprocedure Evaluation (Signed)
  Anesthesia Post-op Note  Patient: Ashlee [name]  Procedure(s) Performed: Procedure(s) (LRB): UPPER ENDOSCOPIC ULTRASOUND (EUS) LINEAR (N/A)  Patient Location: PACU  Anesthesia Type: MAC  Level of Consciousness: awake and alert   Airway and Oxygen Therapy: Patient Spontanous Breathing  Post-op Pain: mild  Post-op Assessment: Post-op Vital signs reviewed, Patient's Cardiovascular Status Stable, Respiratory Function Stable, Patent Airway and No signs of Nausea or vomiting  Last Vitals:  Filed Vitals:   07/04/14 0906  BP: 138/68  Pulse:   Temp:   Resp:     Post-op Vital Signs: stable   Complications: No apparent anesthesia complications

## 2014-07-04 NOTE — Transfer of Care (Signed)
Immediate Anesthesia Transfer of Care Note  Patient: Ashlee [name]  Procedure(s) Performed: Procedure(s): UPPER ENDOSCOPIC ULTRASOUND (EUS) LINEAR (N/A)  Patient Location: PACU  Anesthesia Type:MAC  Level of Consciousness: awake, alert  and oriented  Airway & Oxygen Therapy: Patient Spontanous Breathing and Patient connected to face mask oxygen  Post-op Assessment: Report given to RN and Post -op Vital signs reviewed and stable  Post vital signs: Reviewed and stable  Last Vitals:  Filed Vitals:   07/04/14 0720  BP: 126/61  Pulse: 92  Temp: 36.8 C  Resp: 12    Complications: No apparent anesthesia complications

## 2014-07-05 ENCOUNTER — Encounter (HOSPITAL_COMMUNITY): Payer: Self-pay | Admitting: Gastroenterology

## 2014-07-05 ENCOUNTER — Telehealth: Payer: Self-pay | Admitting: Critical Care Medicine

## 2014-07-05 NOTE — Telephone Encounter (Signed)
Called patient, got permission from patient to speak with daughter, Gardenia Phlegm. Advised pt that a DPR needed to be signed at next [place] so that we can speak with daughter without asking.  Daughter is POA and feels that they have not been told everything and that no one is being straight forward with her about her mother's condition > they have no idea if this is probable of cancer or is benign but feels no one will answer their questions.  Pt seen in hospital 06/23/14 - 06/26/14.   Had multiple chest scans and chest/pelvic scans and "something was found" and patient was ordered to have a biopsy on her stomach.  Pt had Bx 07/04/14 on stomach d/t something that was found on on recent CT  Daughter was not at visit with patient on 07/03/14 when she saw MW in office but states that she told her that she has no idea what is going on and is not satisfied with the patient care given.   Pt daughter states that the patient is requesting another physician -- Would like to be moved to Dr Lamonte Sakai as he saw her in the hospital. Please advise Dr Melvyn Novas if you are okay with this switch. Thanks.

## 2014-07-05 NOTE — Telephone Encounter (Signed)
I was not assigned to be her primary pulmonary doctor and was trying to help out so she could be seen sooner rather than later  So fine with me to arrange f/u with [facility]  In meatime all the invasive w/u is being done by GI and no role at all for invasive w/u by pulmonary at this point - I stand behind the instructions I personally wrote for the patient at the time of the ov with nothing else to offer at this point

## 2014-07-05 NOTE — Telephone Encounter (Signed)
Patient scheduled to see Dr. Lamonte Sakai on 08/20/2014. Daughter notified. Nothing further needed.

## 2014-07-08 ENCOUNTER — Telehealth: Payer: Self-pay

## 2014-07-08 NOTE — Telephone Encounter (Signed)
See result note.  

## 2014-07-08 NOTE — Telephone Encounter (Signed)
Ashlee [name], please call her. 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 Not sure if this is related to the pulmonary nodules. Also not sure if this accounts for her weight loss. I think the next step is a PET scan. CAn you please order PET scan. I will also forward this to Dr. Melvyn Novas.

## 2014-07-09 ENCOUNTER — Other Ambulatory Visit: Payer: Self-pay

## 2014-07-09 DIAGNOSIS — R9389 Abnormal findings on diagnostic imaging of other specified body structures: Secondary | ICD-10-CM

## 2014-07-09 DIAGNOSIS — C49A0 Gastrointestinal stromal tumor, unspecified site: Secondary | ICD-10-CM

## 2014-07-09 NOTE — Progress Notes (Signed)
07/19/14 11 am [place] arrive at 1030 am NPO 6 hours.   Pt has been notified and instructed

## 2014-07-16 ENCOUNTER — Telehealth: Payer: Self-pay | Admitting: Critical Care Medicine

## 2014-07-16 ENCOUNTER — Telehealth: Payer: Self-pay | Admitting: Gastroenterology

## 2014-07-16 NOTE — Telephone Encounter (Signed)
Spoke with [employer]. She is aware of MW's recommendations. Nothing further was needed.

## 2014-07-16 NOTE — Telephone Encounter (Signed)
As long can get comfortable at rest after neb there's no need for ER from a respiratory perspective but it sounds like more than just a lung problem so check with primary care on the weakness issue

## 2014-07-16 NOTE — Telephone Encounter (Signed)
Spoke with Colletta Maryland, RN with [facility].  She was called by therapy to come visit pt today as therapy stated pt was tachy, weak, and had increased SOB.  BP this am with therapy 118/80. Colletta Maryland states pt is still tachy with HR 120 at rest,  BP now 104/70, lungs very diminished with barely any air movement.  Reports pt c/o increased weakness, increased SOB, increased cough with more mucus production - yellow.  Symptoms started on Sunday.  Also reports pt originally on o2 only qhs but pt has started wearing it 24/7 now.  States at baseline pt dizzy when up but now all the time even when sitting and laying down.  Pt used albuterol hfa on Sunday with relief but hasn't used since.  Colletta Maryland requesting further recs.  As Dr. Joya Gaskins is off, will send to Dr. Melvyn Novas as pt was seen by him on 6/22 for HFU.  Dr. Melvyn Novas, please advise.  Thank you.

## 2014-07-17 NOTE — Telephone Encounter (Signed)
Left message on machine to call back  

## 2014-07-18 NOTE — Telephone Encounter (Signed)
Left message on machine to call back  

## 2014-07-19 ENCOUNTER — Encounter (HOSPITAL_COMMUNITY): Payer: Medicare HMO

## 2014-07-19 NOTE — Telephone Encounter (Signed)
Pt canceled the appt for today.  She will call back to set up another appt

## 2014-08-01 ENCOUNTER — Encounter: Payer: Medicare HMO | Admitting: Adult Health

## 2014-08-08 ENCOUNTER — Telehealth: Payer: Self-pay | Admitting: Gastroenterology

## 2014-08-08 NOTE — Telephone Encounter (Signed)
Most recent procedures faxed to [facility] at [phone] and to Dr Vista Lawman.

## 2014-08-20 ENCOUNTER — Encounter: Payer: Self-pay | Admitting: Emergency Medicine

## 2014-08-20 ENCOUNTER — Ambulatory Visit (INDEPENDENT_AMBULATORY_CARE_PROVIDER_SITE_OTHER): Payer: Medicare HMO | Admitting: Emergency Medicine

## 2014-08-20 VITALS — BP 138/78 | HR 108 | Ht 65.0 in | Wt 106.0 lb

## 2014-08-20 DIAGNOSIS — K319 Disease of stomach and duodenum, unspecified: Secondary | ICD-10-CM

## 2014-08-20 DIAGNOSIS — J449 Chronic obstructive pulmonary disease, unspecified: Secondary | ICD-10-CM

## 2014-08-20 DIAGNOSIS — K3189 Other diseases of stomach and duodenum: Secondary | ICD-10-CM

## 2014-08-20 MED ORDER — TIOTROPIUM BROMIDE MONOHYDRATE 1.25 MCG/ACT IN AERS: 1.0000 | INHALATION_SPRAY | Freq: Every day | RESPIRATORY_TRACT | Status: DC

## 2014-08-20 MED ORDER — ALBUTEROL SULFATE HFA 108 (90 BASE) MCG/ACT IN AERS: 1.0000 | INHALATION_SPRAY | Freq: Four times a day (QID) | RESPIRATORY_TRACT | Status: AC | PRN

## 2014-08-20 NOTE — Assessment & Plan Note (Signed)
She had an EUS with biopsy that showed spindle cells but was not definitive for malignancy. 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 She never had her PET scan and this needs to be scheduled as soon as possible. Based on this I will discuss the case with Dr. Ardis Hughs and we will decide next steps

## 2014-08-20 NOTE — Patient Instructions (Signed)
Your biopsy results done by Dr. Ardis Hughs are suggestive of a gastric cancer. This may also be related to your pulmonary nodules. We need to get your PET scan scheduled in order to evaluate further. Based on this result we will determine whether he needs another biopsy or whether we can refer you for treatment.  Please continue your Symbicort 2 puffs twice a day We will change your Spiriva to the Respimat version, 2 puffs once a day Follow with Dr Lamonte Sakai or Tammy Parrett in 2 weeks

## 2014-08-20 NOTE — Assessment & Plan Note (Signed)
Continues to smoke. I discussed the importance of cessation with her and we talked about possible medications to allow her to set quit date at some point in the future. For now she agrees that she will try to continue to cut down. She has difficulty administering her Spiriva and I will change the delivery system to respimat today. Continue Symbicort

## 2014-08-20 NOTE — Addendum Note (Signed)
Addended by: Maurice March on: 08/20/2014 03:19 PM   Modules accepted: Orders
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
## 2014-08-28 ENCOUNTER — Ambulatory Visit: Payer: Medicare Other | Admitting: Critical Care Medicine

## 2014-09-03 ENCOUNTER — Encounter (HOSPITAL_COMMUNITY)
Admission: RE | Admit: 2014-09-03 | Discharge: 2014-09-03 | Disposition: A | Discharge status: Home / Self Care | Payer: Medicare HMO | Source: Ambulatory Visit | Attending: Emergency Medicine | Admitting: Emergency Medicine

## 2014-09-03 DIAGNOSIS — K319 Disease of stomach and duodenum, unspecified: Secondary | ICD-10-CM | POA: Insufficient documentation

## 2014-09-03 DIAGNOSIS — K3189 Other diseases of stomach and duodenum: Secondary | ICD-10-CM

## 2014-09-03 LAB — GLUCOSE, CAPILLARY: Glucose-Capillary: 101 mg/dL — ABNORMAL HIGH (ref 65–99)

## 2014-09-03 MED ORDER — FLUDEOXYGLUCOSE F - 18 (FDG) INJECTION
5.1000 | Freq: Once | INTRAVENOUS | Status: DC | PRN
  Administered 2014-09-03: 5.1 via INTRAVENOUS
  Filled 2014-09-03: qty 5.1

## 2014-09-04 ENCOUNTER — Ambulatory Visit: Payer: Medicare HMO | Admitting: Adult Health

## 2014-09-17 ENCOUNTER — Ambulatory Visit (INDEPENDENT_AMBULATORY_CARE_PROVIDER_SITE_OTHER): Payer: Medicare HMO | Admitting: Adult Health

## 2014-09-17 ENCOUNTER — Encounter: Payer: Self-pay | Admitting: Adult Health

## 2014-09-17 VITALS — BP 136/80 | HR 104 | Temp 97.4°F | Ht 65.0 in | Wt 103.0 lb

## 2014-09-17 DIAGNOSIS — J449 Chronic obstructive pulmonary disease, unspecified: Secondary | ICD-10-CM

## 2014-09-17 DIAGNOSIS — R918 Other nonspecific abnormal finding of lung field: Secondary | ICD-10-CM | POA: Diagnosis not present

## 2014-09-17 DIAGNOSIS — K3189 Other diseases of stomach and duodenum: Secondary | ICD-10-CM

## 2014-09-17 DIAGNOSIS — K319 Disease of stomach and duodenum, unspecified: Secondary | ICD-10-CM

## 2014-09-17 DIAGNOSIS — Z23 Encounter for immunization: Secondary | ICD-10-CM | POA: Diagnosis not present

## 2014-09-17 NOTE — Progress Notes (Signed)
   Subjective:    Patient ID: Ashlee [name], female    DOB: Aug 12, 1935, 79 y.o.   MRN: [id number]  HPI 79 yo female smoker with severe COPD GOLD IV   09/17/2014 Follow up : COPD GOLD IV , Gastric Mass, Lung nodule and PET results.  Pt returns for follow up  Recent found to have gastric mass. Underwent a EUS with bx by Dr. Ardis Hughs that showed spindle cells but was not definitive for malignancy . 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 She has lung nodules with dominant 1 cm RLL module . She was set up for a PET scan .  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 13 mm RLL lung nodule that was hypermetabolic. No hypermetabolic nodes. discussed these results with pt. Discussed getting a biopsy of this area with CT guided bx . Pt is agreeable. Risks reviewed with pt.  Case discussed in detail with Dr. Lamonte Sakai  .  She denies any chest pain, orthopnea, hemoptysis     Or edema .    Review of Systems Constitutional:   No  weight loss, night sweats,  Fevers, chills, + fatigue, or  lassitude.  HEENT:   No headaches,  Difficulty swallowing,  Tooth/dental problems, or  Sore throat,                No sneezing, itching, ear ache, nasal congestion, post nasal drip,   CV:  No chest pain,  Orthopnea, PND, swelling in lower extremities, anasarca, dizziness, palpitations, syncope.   GI  No heartburn, indigestion, abdominal pain, nausea, vomiting, diarrhea, change in bowel habits, loss of appetite, bloody stools.   Resp: .  No chest wall deformity  Skin: no rash or lesions.  GU: no dysuria, change in color of urine, no urgency or frequency.  No flank pain, no hematuria   MS:  No joint pain or swelling.  No decreased range of motion.  No back pain.  Psych:  No change in mood or affect. No depression or anxiety.  No memory loss.         Objective:   Physical Exam GEN: A/Ox3; pleasant , NAD , frail   HEENT:  [place]/AT,  EACs-clear, TMs-wnl, NOSE-clear, THROAT-clear, no  lesions, no postnasal drip or exudate noted.   NECK:  Supple w/ fair ROM; no JVD; normal carotid impulses w/o bruits; no thyromegaly or nodules palpated; no lymphadenopathy.  RESP  Decreased  BS in bases .no accessory muscle use, no dullness to percussion  CARD:  RRR, no m/r/g  , no peripheral edema, pulses intact, no cyanosis or clubbing.  GI:   Soft & nt; nml bowel sounds; no organomegaly or masses detected.  Musco: Warm bil, no deformities or joint swelling noted.   Neuro: alert, no focal deficits noted.    Skin: Warm, no lesions or rashes         Assessment & Plan:

## 2014-09-17 NOTE — Patient Instructions (Signed)
Flu shot today .  Work on not smoking .  Please continue your Symbicort and Spiriva .  We are setting you up for referral to Interventional Radiology for CT guided lung biopsy for lung nodule.  Follow up Dr. Lamonte Sakai  In 4 weeks and As needed

## 2014-09-18 ENCOUNTER — Other Ambulatory Visit: Payer: Self-pay | Admitting: Emergency Medicine

## 2014-09-19 ENCOUNTER — Telehealth: Payer: Self-pay | Admitting: Gastroenterology

## 2014-09-19 NOTE — Telephone Encounter (Signed)
Rob, We discussed her case at GI tumor board yesterday. Consensus was that the gastric mass is probably at GIST and very unlikely to be related to lung mass.  It does not need dedicated follow up.  Let me know final path from the lung mass (I suppose if that shows spindle type cells then we would have to rethink the above).  Thanks  DJ

## 2014-09-19 NOTE — Assessment & Plan Note (Signed)
Cont on current regimen with Symbicort and Spiriva

## 2014-09-19 NOTE — Assessment & Plan Note (Signed)
Hypermetabolic RLL lung nodule on PET scan  Case reviewed with Dr. Lamonte Sakai  And pt  Pt will proceed with  CT guided lung biopsy for lung nodule.  Follow up Dr. Lamonte Sakai  In 4 weeks

## 2014-09-19 NOTE — Telephone Encounter (Signed)
Dan, Thanks very much. I'll let you know what it shows. Rob

## 2014-09-19 NOTE — Assessment & Plan Note (Signed)
Concern for GIST tumor , pt to cont follow up with DR. Ardis Hughs.

## 2014-10-05 ENCOUNTER — Other Ambulatory Visit: Payer: Self-pay | Admitting: Critical Care Medicine

## 2014-10-07 ENCOUNTER — Other Ambulatory Visit: Payer: Self-pay | Admitting: Critical Care Medicine

## 2014-10-07 NOTE — Telephone Encounter (Signed)
Spoke with pt's daughter, Joelene Millin. Pt needs refill on Spiriva Respimat. Advised her that this rx was sent in on 09/19/14 with 6 additional refills. She will contact the pharmacy. Nothing further was needed.

## 2014-10-07 NOTE — Telephone Encounter (Signed)
Received refill request for Spiriva Handihaler. Per Last OV with RB, Handihaler was changed to Respimat and rx sent to pt's pharm Left msg with pt's daughter to have pt call office back to clarify above.

## 2014-10-08 ENCOUNTER — Telehealth: Payer: Self-pay | Admitting: Emergency Medicine

## 2014-10-08 MED ORDER — TIOTROPIUM BROMIDE MONOHYDRATE 1.25 MCG/ACT IN AERS: 2.0000 | INHALATION_SPRAY | Freq: Every day | RESPIRATORY_TRACT | Status: AC

## 2014-10-08 NOTE — Telephone Encounter (Signed)
Called and spoke with Ashlee [name] Ashlee [name] stated that she was changed to spiriva respimat by Dr Lamonte Sakai at last appt and needed refill sent to her pharmacy Informed Ashlee [name] that refill would be sent  Pharmacy verified with Ashlee [name]   Refill sent electronically Nothing further is needed

## 2014-10-09 ENCOUNTER — Other Ambulatory Visit: Payer: Self-pay | Admitting: Radiology

## 2014-10-10 ENCOUNTER — Ambulatory Visit (HOSPITAL_COMMUNITY)
Admission: RE | Admit: 2014-10-10 | Discharge: 2014-10-10 | Disposition: A | Discharge status: Home / Self Care | Payer: Medicare HMO | Source: Ambulatory Visit | Attending: Adult Health | Admitting: Adult Health

## 2014-10-10 ENCOUNTER — Telehealth: Payer: Self-pay | Admitting: Emergency Medicine

## 2014-10-10 ENCOUNTER — Encounter (HOSPITAL_COMMUNITY): Payer: Self-pay

## 2014-10-10 DIAGNOSIS — F17210 Nicotine dependence, cigarettes, uncomplicated: Secondary | ICD-10-CM | POA: Insufficient documentation

## 2014-10-10 DIAGNOSIS — N289 Disorder of kidney and ureter, unspecified: Secondary | ICD-10-CM | POA: Insufficient documentation

## 2014-10-10 DIAGNOSIS — J45909 Unspecified asthma, uncomplicated: Secondary | ICD-10-CM | POA: Diagnosis not present

## 2014-10-10 DIAGNOSIS — R918 Other nonspecific abnormal finding of lung field: Secondary | ICD-10-CM | POA: Diagnosis present

## 2014-10-10 DIAGNOSIS — Z5320 Procedure and treatment not carried out because of patient's decision for unspecified reasons: Secondary | ICD-10-CM | POA: Diagnosis not present

## 2014-10-10 DIAGNOSIS — E785 Hyperlipidemia, unspecified: Secondary | ICD-10-CM | POA: Insufficient documentation

## 2014-10-10 DIAGNOSIS — R0600 Dyspnea, unspecified: Secondary | ICD-10-CM | POA: Diagnosis not present

## 2014-10-10 DIAGNOSIS — R05 Cough: Secondary | ICD-10-CM | POA: Insufficient documentation

## 2014-10-10 DIAGNOSIS — Z9981 Dependence on supplemental oxygen: Secondary | ICD-10-CM | POA: Insufficient documentation

## 2014-10-10 DIAGNOSIS — I10 Essential (primary) hypertension: Secondary | ICD-10-CM | POA: Insufficient documentation

## 2014-10-10 DIAGNOSIS — Z79899 Other long term (current) drug therapy: Secondary | ICD-10-CM | POA: Diagnosis not present

## 2014-10-10 DIAGNOSIS — R531 Weakness: Secondary | ICD-10-CM | POA: Insufficient documentation

## 2014-10-10 DIAGNOSIS — R634 Abnormal weight loss: Secondary | ICD-10-CM | POA: Insufficient documentation

## 2014-10-10 DIAGNOSIS — K219 Gastro-esophageal reflux disease without esophagitis: Secondary | ICD-10-CM | POA: Diagnosis not present

## 2014-10-10 DIAGNOSIS — J449 Chronic obstructive pulmonary disease, unspecified: Secondary | ICD-10-CM | POA: Insufficient documentation

## 2014-10-10 LAB — CBC
HCT: 47 % — ABNORMAL HIGH (ref 36.0–46.0)
Hemoglobin: 15.2 g/dL — ABNORMAL HIGH (ref 12.0–15.0)
MCH: 32.9 pg (ref 26.0–34.0)
MCHC: 32.3 g/dL (ref 30.0–36.0)
MCV: 101.7 fL — ABNORMAL HIGH (ref 78.0–100.0)
PLATELETS: 233 10*3/uL (ref 150–400)
RBC: 4.62 MIL/uL (ref 3.87–5.11)
RDW: 14.3 % (ref 11.5–15.5)
WBC: 9.2 10*3/uL (ref 4.0–10.5)

## 2014-10-10 LAB — PROTIME-INR
INR: 0.94 (ref 0.00–1.49)
PROTHROMBIN TIME: 12.8 s (ref 11.6–15.2)

## 2014-10-10 LAB — APTT: APTT: 31 s (ref 24–37)

## 2014-10-10 MED ORDER — SODIUM CHLORIDE 0.9 % IV SOLN: Freq: Once | INTRAVENOUS | Status: DC

## 2014-10-10 NOTE — Telephone Encounter (Signed)
Pt was scheduled for a CT biopsy today. She went in for her appointment and was on RA and sats were at 85%. Dr. Jeronimo Norma explained all of the risks of the biopsy to the pt and her family members and they declined to have the biopsy done. The biopsy was canceled. Dr. Jeronimo Norma wants to speak to RB this afternoon. Will route message to him to address.

## 2014-10-10 NOTE — Sedation Documentation (Signed)
O2 2.5 L/Blanca aplied

## 2014-10-10 NOTE — Sedation Documentation (Signed)
MD Henn at bedside, discussed with family and patient in great lengths the possible risk factors with having Lung BX. POA and patient wish to postpone procedure at this time. Dr. Anselm Pancoast is communicating with Dr. Lamonte Sakai in regards to patients wishes. Decision is made to postpone for further evaluation.   Babs Bertin RN
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
## 2014-10-10 NOTE — Telephone Encounter (Signed)
Discussed with Dr Anselm Pancoast. Agree with deferring the biopsy until we can discuss her status and plans further. Please set her up with OV with me and we will revisit options.

## 2014-10-10 NOTE — Telephone Encounter (Signed)
Pt has upcoming appointment with RB on 10/25/2014 at 2:15pm. Nothing further was needed.

## 2014-10-23 ENCOUNTER — Other Ambulatory Visit: Payer: Self-pay | Admitting: Critical Care Medicine

## 2014-10-25 ENCOUNTER — Ambulatory Visit (INDEPENDENT_AMBULATORY_CARE_PROVIDER_SITE_OTHER): Payer: Medicare HMO | Admitting: Emergency Medicine

## 2014-10-25 ENCOUNTER — Telehealth: Payer: Self-pay | Admitting: Emergency Medicine

## 2014-10-25 ENCOUNTER — Encounter: Payer: Self-pay | Admitting: Emergency Medicine

## 2014-10-25 VITALS — BP 122/70 | HR 74 | Ht 65.5 in | Wt 103.0 lb

## 2014-10-25 DIAGNOSIS — J449 Chronic obstructive pulmonary disease, unspecified: Secondary | ICD-10-CM | POA: Diagnosis not present

## 2014-10-25 DIAGNOSIS — R918 Other nonspecific abnormal finding of lung field: Secondary | ICD-10-CM

## 2014-10-25 MED ORDER — BUDESONIDE-FORMOTEROL FUMARATE 160-4.5 MCG/ACT IN AERO: 2.0000 | INHALATION_SPRAY | Freq: Two times a day (BID) | RESPIRATORY_TRACT | Status: DC
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
## 2014-10-25 NOTE — Patient Instructions (Addendum)
We will not reorder a lung biopsy at this time since you prefer not to have this done.  We'll refer you to discuss your case with radiation oncology as you may be a candidate to have radiation therapy even without the biopsy. It's not clear at this time whether this plan will be possible Please continue to work on stopping smoking Continue Symbicort and Spiriva We will refill your albuterol nebulizer, please use this up to every 4 hours if needed for shortness of breath Follow with Dr Lamonte Sakai in 2 months or sooner if you have any problems.

## 2014-10-25 NOTE — Assessment & Plan Note (Signed)
Revisited smoking cessation today.  She will continue her Spiriva and Symbicort with albuterol as needed She needs to wear her oxygen at all times including during the day at 2 L/m

## 2014-10-25 NOTE — Telephone Encounter (Signed)
Rx has been sent in. Nothing further is needed. 

## 2014-10-25 NOTE — Assessment & Plan Note (Signed)
Suspect that her right lower lobe mass is a primary lung cancer. She does not want to have it biopsied and I confirmed this with her today. She would be willing to consider empiric radiation therapy although I have not promised her that this is possible. I will refer her to radiation oncology to discuss the option. If they feel that she definitely needs biopsy and I can revisit with her but I do not think she will agree.

## 2014-10-28 ENCOUNTER — Telehealth: Payer: Self-pay | Admitting: Emergency Medicine

## 2014-10-28 MED ORDER — IPRATROPIUM-ALBUTEROL 0.5-2.5 (3) MG/3ML IN SOLN: 3.0000 mL | RESPIRATORY_TRACT | Status: AC | PRN

## 2014-10-28 NOTE — Telephone Encounter (Signed)
Spoke with pt. Aware refill sent in. Nothing further needed

## 2014-10-31 ENCOUNTER — Telehealth: Payer: Self-pay | Admitting: Emergency Medicine

## 2014-10-31 NOTE — Telephone Encounter (Signed)
lmtcb x1 for [place].

## 2014-11-01 NOTE — Telephone Encounter (Signed)
Just needs a yes or no pertaining to if patient is good candidate for hospice, please call stephanie at [phone]

## 2014-11-01 NOTE — Telephone Encounter (Signed)
Lm for Colletta Maryland to cb

## 2014-11-01 NOTE — Telephone Encounter (Signed)
Received call from [place] from [facility] wanting to know if pt is a candidate for Hospice care? RB - please advise.

## 2014-11-04 NOTE — Telephone Encounter (Signed)
Yes she is a good candidate. To my knowledge she has not been referred.

## 2014-11-04 NOTE — Telephone Encounter (Signed)
Spoke with Colletta [place] at [facility], she is aware of RB's recs.  Nothing further at this time needed from our office.

## 2014-11-14 NOTE — Progress Notes (Signed)
Thoracic Location of Tumor / Histology: right lower lobe mass   Patient presented with symptoms Ashlee [name] dyspnea since mid June   Biopsies have not been done.  Patient refused biopsy after hearing the possible risks.  Tobacco/Marijuana/Snuff/ETOH use: smoked for 65 years.  She quit smoking for 2 months and is now smoking 1.5 ppds per day.  Denies Marijuana, snuff, etoh use.  Past/Anticipated interventions by cardiothoracic surgery, if any: no  Past/Anticipated interventions by medical oncology, if any: no  Signs/Symptoms  Weight changes, if any: yes - has lost about 20 lbs over the last year.  She reports a poor appetite.  Respiratory complaints, if any: yes - reports shortness of breath.  Reports a cough at night.  Hemoptysis, if any: reports seeing streaks of blood 3-4 weeks ago Pain issues, if any:  Reports pain in her mid back and stomach  SAFETY ISSUES:  Prior radiation? no  Pacemaker/ICD? no   Possible current pregnancy?no  Is the patient on methotrexate? no  Current Complaints / other details:  Patient is here with her 2 daughters.  She also reports having trouble swallowing that started a month ago.  She reports food does not go down.  She also reports weakness in her right arm.  Her daughter reports that this happened 2 weeks ago and she was not responsive for a few minutes.  She also reports weakness in her legs.  She denies having headaches.  She does report that her balance is off.  She does have blurred vision but has had it for a while.  BP 159/66 mmHg  Pulse 79  Temp(Src) 97.4 F (36.3 C) (Oral)  Resp 20  Ht 5' 5.5" (1.664 m)  Wt 113 lb 6.4 oz (51.438 kg)  BMI 18.58 kg/m2  SpO2 95%   Wt Readings from Last 3 Encounters:  11/20/14 113 lb 6.4 oz (51.438 kg)  10/25/14 103 lb (46.72 kg)  10/10/14 103 lb (46.72 kg)

## 2014-11-20 ENCOUNTER — Ambulatory Visit
Admission: RE | Admit: 2014-11-20 | Discharge: 2014-11-20 | Disposition: A | Discharge status: Home / Self Care | Payer: Medicare HMO | Source: Ambulatory Visit | Attending: Radiation Oncology | Admitting: Radiation Oncology

## 2014-11-20 ENCOUNTER — Encounter: Payer: Self-pay | Admitting: Radiation Oncology

## 2014-11-20 VITALS — BP 159/66 | HR 79 | Temp 97.4°F | Resp 20 | Ht 65.5 in | Wt 113.4 lb

## 2014-11-20 DIAGNOSIS — J45909 Unspecified asthma, uncomplicated: Secondary | ICD-10-CM | POA: Insufficient documentation

## 2014-11-20 DIAGNOSIS — R918 Other nonspecific abnormal finding of lung field: Secondary | ICD-10-CM

## 2014-11-20 DIAGNOSIS — R569 Unspecified convulsions: Secondary | ICD-10-CM | POA: Insufficient documentation

## 2014-11-20 DIAGNOSIS — J383 Other diseases of vocal cords: Secondary | ICD-10-CM | POA: Insufficient documentation

## 2014-11-20 DIAGNOSIS — E785 Hyperlipidemia, unspecified: Secondary | ICD-10-CM | POA: Diagnosis not present

## 2014-11-20 DIAGNOSIS — F410 Panic disorder [episodic paroxysmal anxiety] without agoraphobia: Secondary | ICD-10-CM | POA: Diagnosis not present

## 2014-11-20 DIAGNOSIS — K635 Polyp of colon: Secondary | ICD-10-CM | POA: Insufficient documentation

## 2014-11-20 DIAGNOSIS — K219 Gastro-esophageal reflux disease without esophagitis: Secondary | ICD-10-CM | POA: Insufficient documentation

## 2014-11-20 DIAGNOSIS — I10 Essential (primary) hypertension: Secondary | ICD-10-CM | POA: Diagnosis not present

## 2014-11-20 DIAGNOSIS — R911 Solitary pulmonary nodule: Secondary | ICD-10-CM | POA: Insufficient documentation

## 2014-11-20 DIAGNOSIS — I6529 Occlusion and stenosis of unspecified carotid artery: Secondary | ICD-10-CM | POA: Insufficient documentation

## 2014-11-20 NOTE — Progress Notes (Signed)
Please see the Nurse Progress Note in the MD Initial Consult Encounter for this patient. 

## 2014-11-20 NOTE — Progress Notes (Signed)
Radiation Oncology         [phone] ________________________________  Initial Outpatient Consultation  Name: Ashlee [name] MRN: [id number]  Date: 11/20/2014  DOB: [birth year]  IO:[id number],EQASTM, MD  Benito Mccreedy, MD   REFERRING PHYSICIAN: Benito Mccreedy, MD  DIAGNOSIS: PET positive solitary nodule in the right lower lung  HISTORY OF PRESENT ILLNESS::Ashlee [name] is a 79 y.o. female seen out of the courtesy of Dr.Byrum who presents with symptoms of progressive dyspnea since mid-June. She has past medical history significant for severe COPD, hypertension, GERD, polyneuropathy and remote seizures. Patient has smoked for 65 years, is a 20 pk-yrs smoker and continues to smoke 4-5 cigarettes per day.   Based on dyspnea, a CT scan of the chest/abdomen/pelvis was performed on 06/25/14. Imaging revealed a 1 cm right lower lobe pulmonary mass. PET scan on 09/03/14 further confirmed this mass, now enlarged at 13 mm. No abdominal, cranial or hepatic metastatic disease was found. The patient was subsequently evaluated for CT guided biopsy of this mass, however upon hearing of risks/benefits she decided to not proceed with the procedure. She uses supplemental O2 at night.   Patient is here with her 2 daughters. She also reports having trouble swallowing that started a month ago. She reports food does not go down. She also reports weakness in her right arm. Her daughter reports that this happened 2 weeks ago and she was not responsive for a few minutes. She also reports weakness in her legs. She denies having headaches. She does report that her balance is off. She does have blurred vision but has had it for a while.  I have recommended patient consult her primary care physician concerning these other issues.  Additional symptoms include: Poor appetite, weakness, weight loss, stomach pain. She hears wheezing with exertion, when laying down to sleep. She has dififculty sleeping. She has  frequent cough, productive of phlegm. She saw blood in mucous on 8/8. No pain in the chest.   Recent, stomach biopsy was found to have atypical cells suspicious for GIST.    PREVIOUS RADIATION THERAPY: No  PAST MEDICAL HISTORY:  has a past medical history of HTN (hypertension); Asthma; Dyslipidemia; Carotid stenosis; Nonspecific elevation of levels of transaminase or lactic acid dehydrogenase (LDH); Contact dermatitis; Polyneuropathy in other diseases classified elsewhere [facility]); Folate-deficiency anemia; Lump or mass in breast; Acid reflux disease; Colonic polyp; Fracture of right pubis ([place]); Osteoporosis; Vocal cord leukoplakia; Seizure disorder ([place]); Panic attack; COPD (chronic obstructive pulmonary disease) ([place]); Oxygen dependent; and Renal insufficiency.    PAST SURGICAL HISTORY: Past Surgical History  Procedure Laterality Date  . Tubal ligation    . Breast biopsy Bilateral   . Appendectomy    . Cesarean section      x 2  . Cholecystectomy    . Colonoscopy    . Upper gastrointestinal endoscopy    . Colonoscopy N/A 09/12/2012    Procedure: COLONOSCOPY;  Surgeon: Gatha Mayer, MD;  Location: [facility] END[facility]COPY;  Service: Endoscopy;  Laterality: N/A;  . Esophagogastroduodenoscopy (egd) with propofol N/A 06/25/2014    Procedure: ESOPHAGOGASTRODUODENOSCOPY (EGD) WITH PROPOFOL;  Surgeon: Milus Banister, MD;  Location: [facility];  Service: Endoscopy;  Laterality: N/A;  . Eus N/A 07/04/2014    Procedure: UPPER ENDOSCOPIC ULTRASOUND (EUS) LINEAR;  Surgeon: Milus Banister, MD;  Location: [facility];  Service: Endoscopy;  Laterality: N/A;    FAMILY HISTORY: family history includes Breast cancer in her sister; Cancer in her maternal grandfather; Cervical cancer in  her daughter; Heart attack (age of onset: 46) in her son; Other in her mother; Thyroid disease in her daughter. There is no history of Colon cancer.  SOCIAL HISTORY:  reports that she has been smoking Cigarettes.  She has a 97.5  pack-year smoking history. She has never used smokeless tobacco. She reports that she does not drink alcohol or use illicit drugs.  ALLERGIES: Donepezil hydrochloride; Alendronate sodium; and Lisinopril  MEDICATIONS:  Current Outpatient Prescriptions  Medication Sig Dispense Refill  . albuterol (PROVENTIL HFA;VENTOLIN HFA) 108 (90 BASE) MCG/ACT inhaler Inhale 1-2 puffs into the lungs every 6 (six) hours as needed for wheezing or shortness of breath. 1 Inhaler 6  . amLODipine (NORVASC) 5 MG tablet Take 5 mg by mouth daily.     . bisacodyl (EQ WOMANS LAXATIVE) 5 MG EC tablet Take 5 mg by mouth daily as needed for constipation.     . budesonide-formoterol (SYMBICORT) 160-4.5 MCG/ACT inhaler Inhale 2 puffs into the lungs 2 (two) times daily. 1 Inhaler 6  . buPROPion (WELLBUTRIN SR) 150 MG 12 hr tablet Take 1 tablet (150 mg total) by mouth 2 (two) times daily. Take daily for 2 days, then take BID starting on Friday 06/28/14 60 tablet 0  . Calcium Carbonate-Vitamin D (CALTRATE 600+D) 600-400 MG-UNIT per tablet Take 1 tablet by mouth 2 (two) times daily.      . Cyanocobalamin (VITAMIN B-12 PO) Take 1 tablet by mouth daily.    . hydrochlorothiazide (MICROZIDE) 12.5 MG capsule Take 12.5 mg by mouth daily.    Marland Kitchen ipratropium-albuterol (DUONEB) 0.5-2.5 (3) MG/3ML SOLN Take 3 mLs by nebulization every 4 (four) hours as needed. DX J44.9 360 mL 3  . lactose free nutrition (BOOST PLUS) LIQD Take 237 mLs by mouth 3 (three) times daily with meals.  0  . mirtazapine (REMERON) 45 MG tablet Take 45 mg by mouth at bedtime.    Marland Kitchen omeprazole (PRILOSEC) 40 MG capsule Take 40 mg by mouth daily as needed (heart burn).     . pravastatin (PRAVACHOL) 80 MG tablet Take 80 mg by mouth daily.     Marland Kitchen SPIRIVA HANDIHALER 18 MCG inhalation capsule PLACE 1 CAPSULE (18 MCG TOTAL) INTO INHALER AND INHALE DAILY. 30 capsule 6  . Tiotropium Bromide Monohydrate (SPIRIVA RESPIMAT) 1.25 MCG/ACT AERS Take 2 puffs by mouth daily. 1 Inhaler 6  .  metoprolol succinate (TOPROL-XL) 25 MG 24 hr tablet Take 25 mg by mouth daily.    . nicotine (NICODERM CQ - DOSED IN MG/24 HOURS) 21 mg/24hr patch Place 1 patch (21 mg total) onto the skin daily. (Patient not taking: Reported on 08/20/2014) 28 patch 0   No current facility-administered medications for this encounter.    REVIEW OF SYSTEMS:  A 15 point review of systems is documented in the electronic medical record. This was obtained by the nursing staff. However, I reviewed this with the patient to discuss relevant findings and make appropriate changes.  Pertinent items are noted in HPI.   PHYSICAL EXAM:  height is 5' 5.5" (1.664 m) and weight is 113 lb 6.4 oz (51.438 kg). Her oral temperature is 97.4 F (36.3 C). Her blood pressure is 159/66 and her pulse is 79. Her respiration is 20 and oxygen saturation is 95%.   General: Alert and oriented, in no acute distress HEENT: Head is normocephalic. Extraocular movements are intact. Oropharynx is clear. Dentures in place. Neck: Neck is supple, no palpable cervical or supraclavicular lymphadenopathy. Heart: Regular in rate and rhythm with  no murmurs, rubs, or gallops. Chest: Clear to auscultation bilaterally, with no rhonchi, wheezes, or rales. Breath sounds distant but clear Abdomen: Soft, nontender, nondistended, with no rigidity or guarding. Extremities: No cyanosis or edema. Lymphatics: see Neck Exam Skin: No concerning lesions. Musculoskeletal: symmetric strength and muscle tone throughout. Generalized fatigue. The patient has difficulty getting up on the examination table. She uses a walker at home and remains in bed most of the day in light of her dizziness and generalized fatigue Neurologic: Cranial nerves II through XII are grossly intact. No obvious focalities. Speech is fluent. Coordination is intact. Psychiatric: Judgment and insight are intact. Affect is appropriate.   ECOG = 3-4  LABORATORY DATA:  Lab Results  Component Value Date     WBC 9.2 10/10/2014   HGB 15.2* 10/10/2014   HCT 47.0* 10/10/2014   MCV 101.7* 10/10/2014   PLT 233 10/10/2014   NEUTROABS 18.9* 06/23/2014   Lab Results  Component Value Date   NA 140 06/26/2014   K 4.4 06/26/2014   CL 101 06/26/2014   CO2 28 06/26/2014   GLUCOSE 134* 06/26/2014   CREATININE 1.04* 06/26/2014   CALCIUM 8.8* 06/26/2014      RADIOGRAPHY: PET scan findings as above.    IMPRESSION: Ashlee [name] is a 79 y.o. Female with recent CT and PET imaging that shows a 1.3 cm right lower lobe pulmonary mass. Tissue evaluation by biopsy was deemed too risky based on her age and past medical history. The patient would be a good candidate for definitive SBRT treatment to this lung nodule without tissue diagnosis. Of note, the patient was found to have atypical cells suspicious for GIST from gastric biopsy. This area in the gastric region also showed some uptake on the patient's PET scan.  PLAN:We discussed the possible side effects and risks of treatment in addition to the possible benefits of treatment. We discussed the protocol for radiation treatment.  All of the patient's questions were answered. The patient will take time to decide on whether she wants to proceed with radiation treatment or not. I gave the patient my card and told her to contact me if she wants to proceed with radiation treatment.       ------------------------------------------------  Blair Promise, PhD, MD  This document serves as a record of services personally performed by Gery Pray, MD. It was created on his behalf by Derek Mound, a trained medical scribe. The creation of this record is based on the scribe's personal observations and the provider's statements to them. This document has been checked and approved by the attending provider.

## 2014-11-21 NOTE — Addendum Note (Signed)
Encounter addended by: Jacqulyn Liner, RN on: 11/21/2014 12:07 PM<BR>     Documentation filed: Charges VN

## 2014-12-06 ENCOUNTER — Encounter: Payer: Self-pay | Admitting: *Deleted

## 2014-12-06 NOTE — Progress Notes (Signed)
Ashlee [name] Psychosocial Distress Screening Clinical Social Work  Clinical Social Work was referred by distress screening protocol.  The patient scored a 7 on the Psychosocial Distress Thermometer which indicates severe distress. Clinical Social Worker reviewed chart and phoned pt to assess for distress and other psychosocial needs. Pt currently taking Wellbutrin for her depression and she reports this works well for her currently. She reports she is still not sure if she will proceed with treatment at this time. She "really has not had time to think about it" to date. CSW explained role of CSW and Pt and Family Support Team for additional support if needed. Pt appreciated call, but reports she is doing well.   ONCBCN DISTRESS SCREENING 11/20/2014  Screening Type Initial Screening  Distress experienced in past week (1-10) 7  Emotional problem type Depression;Feeling hopeless  Spiritual/Religous concerns type Loss of sense of purpose  Physical Problem type Pain;Nausea/vomiting;Getting around;Bathing/dressing;Breathing;Loss of appetitie;Constipation/diarrhea;Changes in urination    Clinical Social Worker follow up needed: No.  If yes, follow up plan:  Loren Racer, [place]  [facility] Phone: [phone] Fax: [phone]

## 2014-12-27 ENCOUNTER — Telehealth: Payer: Self-pay | Admitting: Emergency Medicine

## 2014-12-27 NOTE — Telephone Encounter (Signed)
Called spoke with [place] from hospice. She reports they were called by [facility] stating pt is needing hospice since she no longer qualifies for home health. Hospice has an eval on Tuesday but wants to know if RB would be attending? Please advise thanks

## 2014-12-31 ENCOUNTER — Ambulatory Visit: Payer: Medicare HMO | Admitting: Emergency Medicine

## 2014-12-31 NOTE — Telephone Encounter (Signed)
Her PCP should be the hospice Dr.

## 2014-12-31 NOTE — Telephone Encounter (Signed)
RB will you be pt's attending MD while in Hospice care? Thanks.

## 2014-12-31 NOTE — Telephone Encounter (Signed)
hospice calling again to see if RB will be pt's attending @ hospice informed hospice that [place] hasn't been in office ans would contact them ASAP they can be reached '@[phone].Hillery Hunter

## 2014-12-31 NOTE — Telephone Encounter (Signed)
Attempted to contact Ashlee [name] at phone number provided, however, office is closed. Called attending nurse at [phone] and advised her of Dr. Agustina Caroli recommendations. She said she would leave a message for [facility] and if she has any further questions she will call us. Nothing further needed.

## 2015-02-21 ENCOUNTER — Ambulatory Visit: Payer: Medicare HMO | Admitting: Emergency Medicine

## 2015-05-01 ENCOUNTER — Other Ambulatory Visit: Payer: Self-pay | Admitting: Adult Health

## 2015-09-12 ENCOUNTER — Other Ambulatory Visit: Payer: Self-pay | Admitting: Emergency Medicine

## 2015-10-22 ENCOUNTER — Encounter: Payer: Self-pay | Admitting: Internal Medicine

## 2015-11-26 IMAGING — CT CT HEAD W/O CM
2 of 4 series · 16 of 30 positions shown, 19 images · non-contrast
Comparison: 07/25/2008

CLINICAL DATA: Headaches for several weeks. Confusion. History of
hypertension and seizure disorder.

EXAM:
CT HEAD WITHOUT CONTRAST
TECHNIQUE: Contiguous axial images were obtained from the base of the skull
through the vertex without intravenous contrast.

[Series 2: head w/o · axial · non-contrast · 0.45mm/px · z∈[+1417,+1537]mm · 10 of 30 slices shown, 13 images]
[im 3/30  brain]
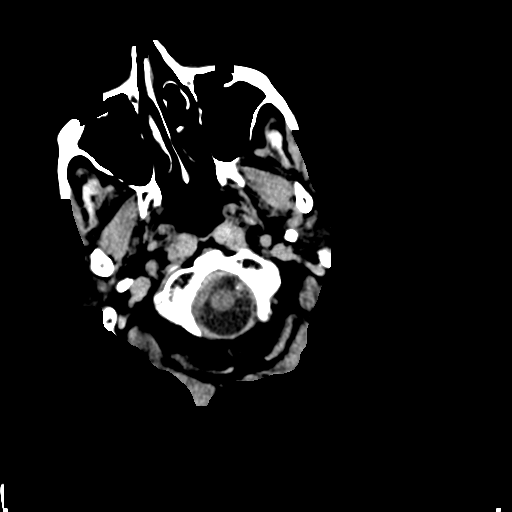
[im 3/30  bone]
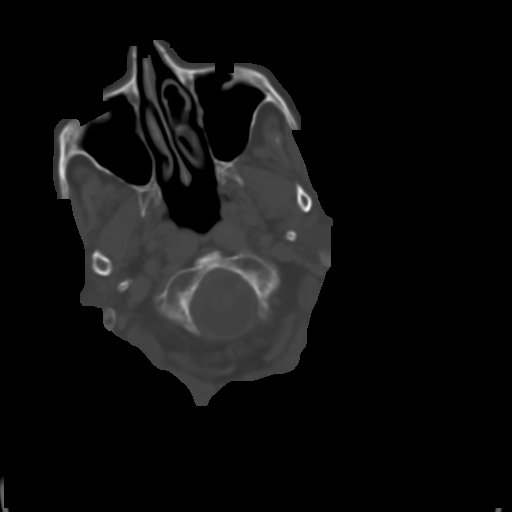
[im 6/30  brain]
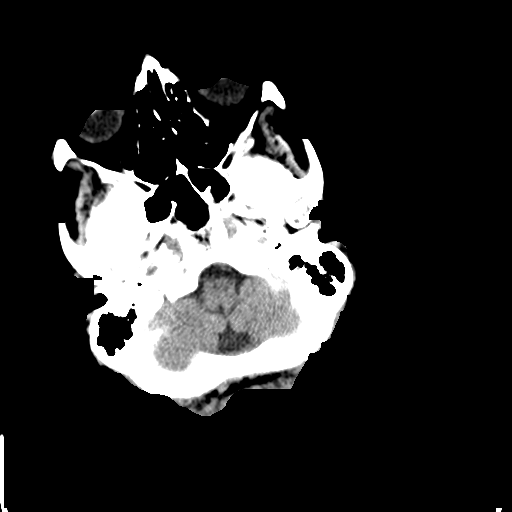
[im 8/30  brain]
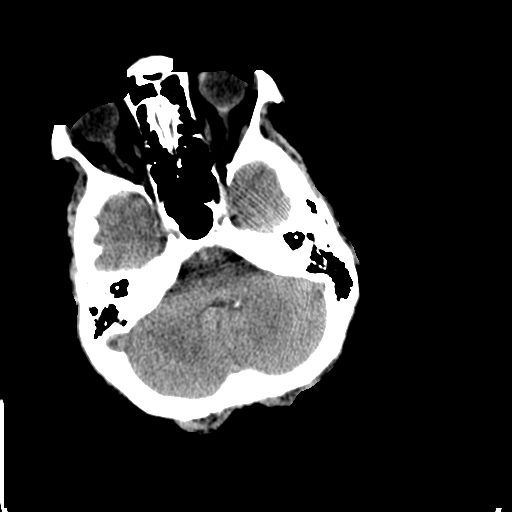
[im 11/30  brain]
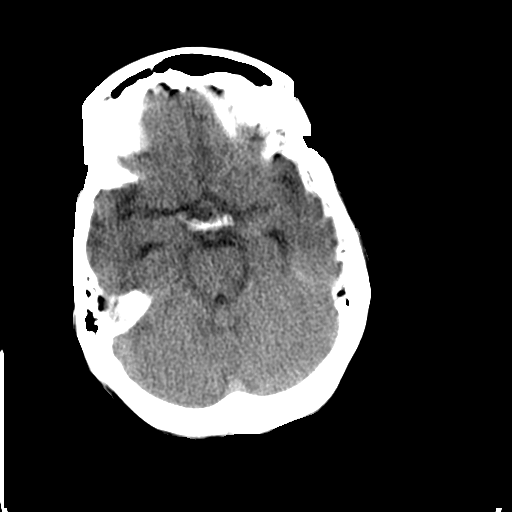
[im 14/30  brain]
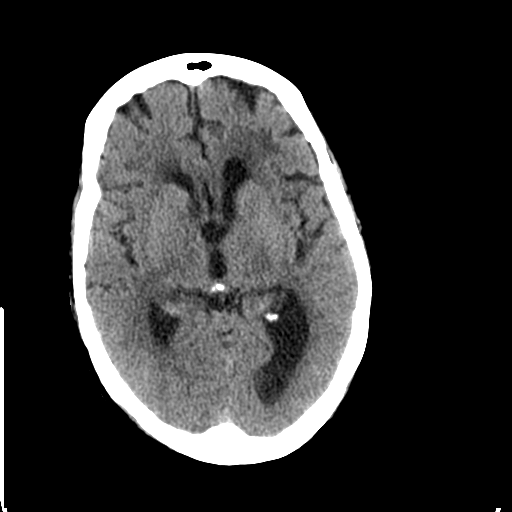
[im 14/30  bone]
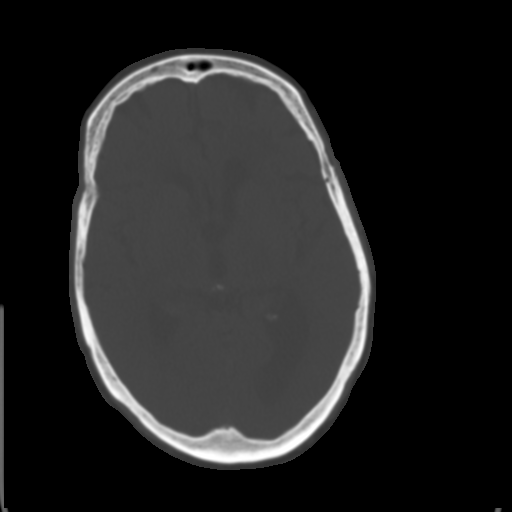
[im 16/30  brain]
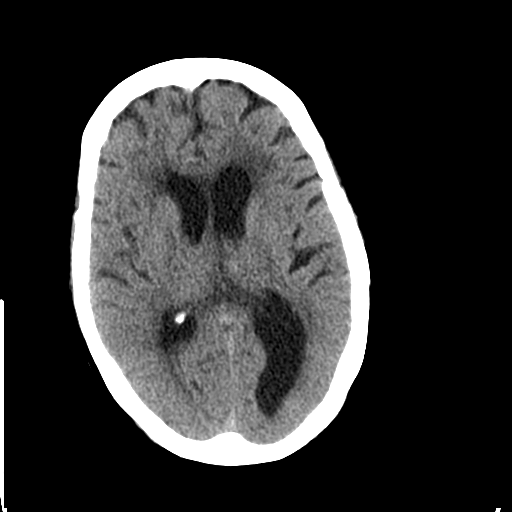
[im 19/30  brain]
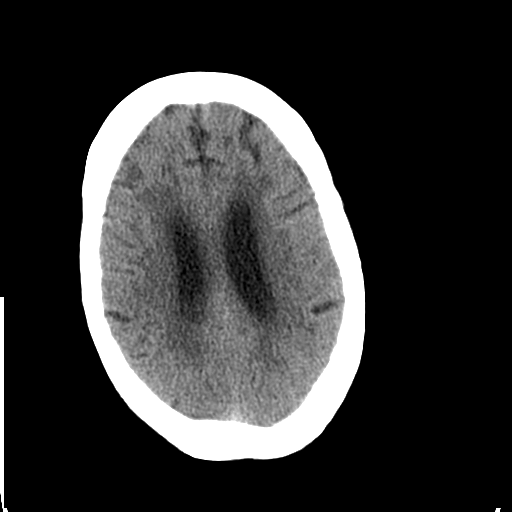
[im 22/30  brain]
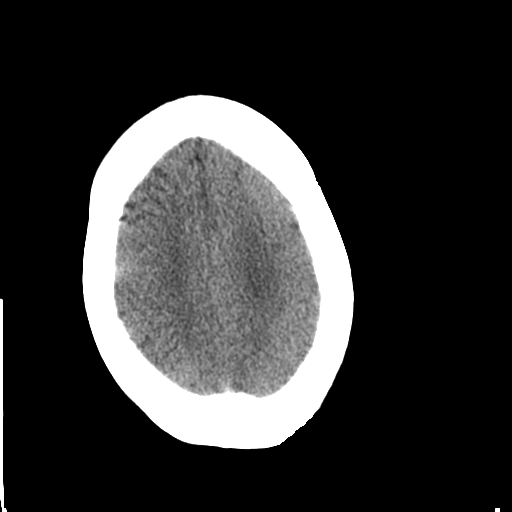
[im 24/30  brain]
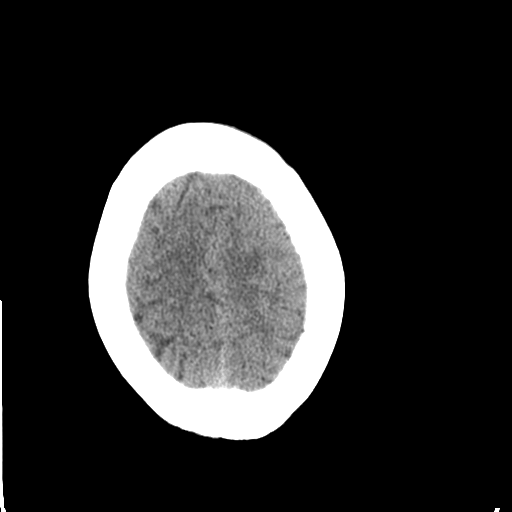
[im 24/30  bone]
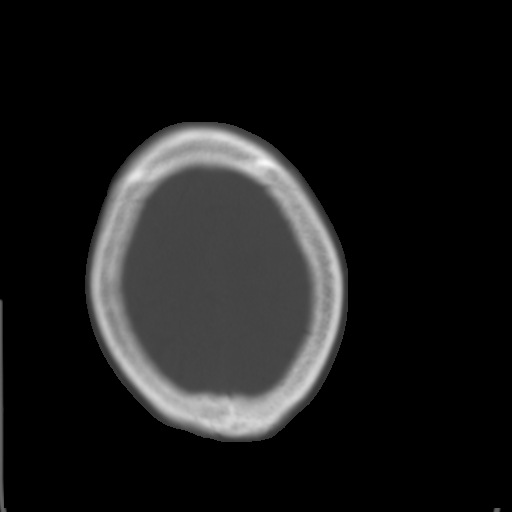
[im 27/30  brain]
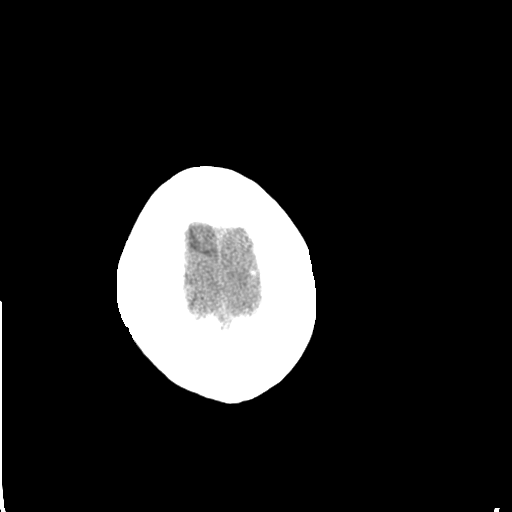

[Series 3: bone windows · axial · 0.45mm/px · z∈[+1417,+1497]mm · 6 of 30 slices shown]
[im 3/30  bone]
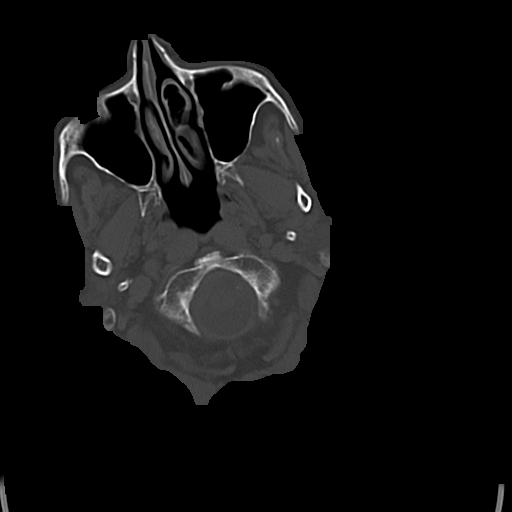
[im 6/30  bone]
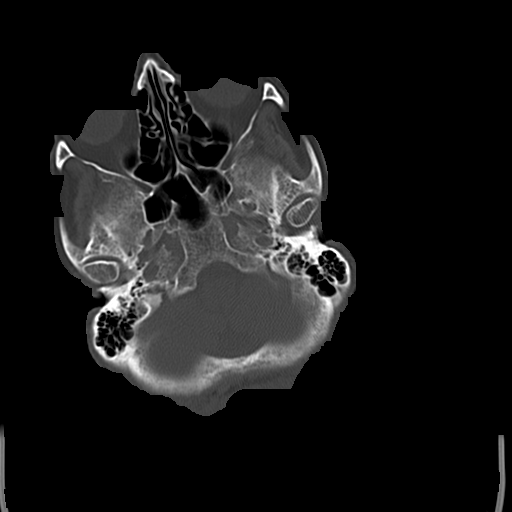
[im 11/30  bone]
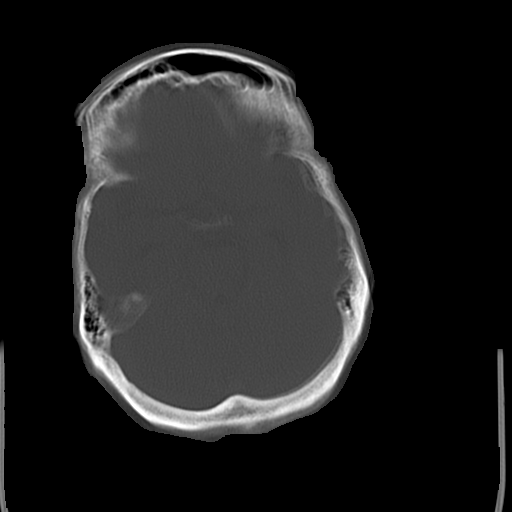
[im 14/30  bone]
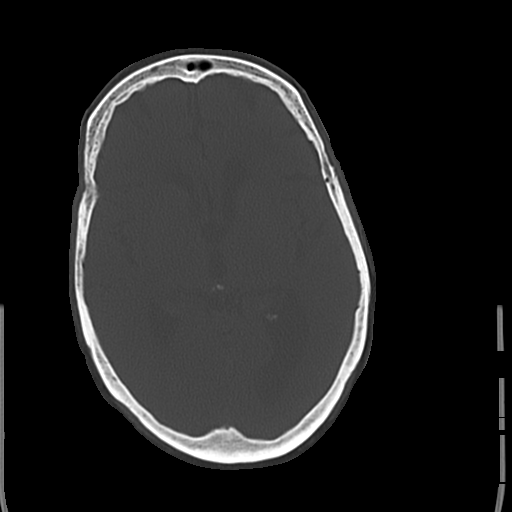
[im 16/30  bone]
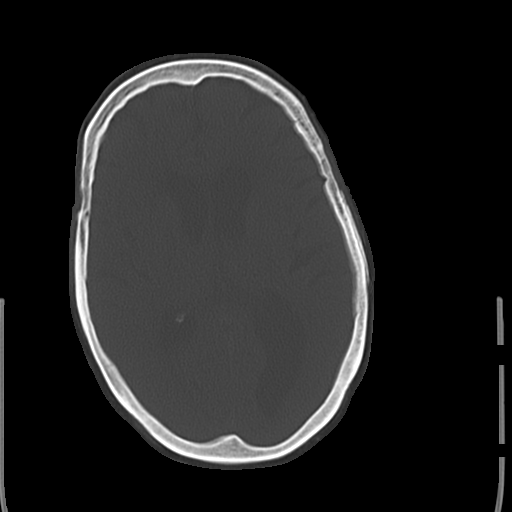
[im 19/30  bone]
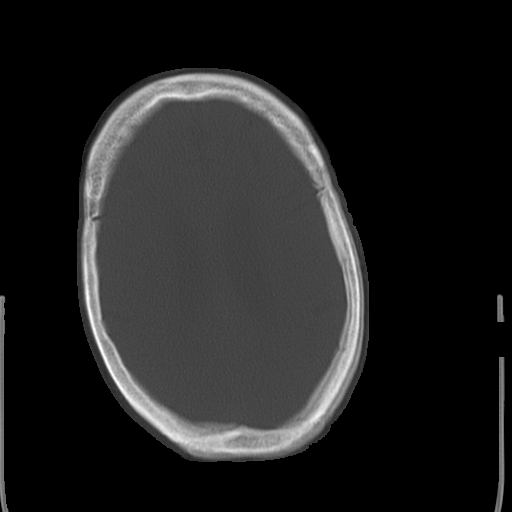

[16 of 30 positions shown; findings below may reference images not displayed]

FINDINGS: Examination is technically limited due to motion artifact. Diffuse
cerebral atrophy most prominent in the frontal and temporal regions.
Ventricular dilatation likely due to central atrophy but
asymmetrically more prominent on the left. This is unchanged.
Low-attenuation changes in the deep white matter consistent with
small vessel ischemia. No mass effect or midline shift. No abnormal
extra-axial fluid collections. Gray-white matter junctions are
distinct. Basal cisterns are not effaced. No evidence of acute
intracranial hemorrhage. No depressed skull fractures. Visualized
paranasal sinuses and mastoid air cells are not opacified.
IMPRESSION: No acute intracranial abnormalities. Chronic atrophy and small
vessel ischemic changes.

## 2016-04-19 DIAGNOSIS — C3490 Malignant neoplasm of unspecified part of unspecified bronchus or lung: Secondary | ICD-10-CM | POA: Diagnosis not present

## 2016-04-19 DIAGNOSIS — I10 Essential (primary) hypertension: Secondary | ICD-10-CM | POA: Diagnosis not present

## 2016-04-19 DIAGNOSIS — M549 Dorsalgia, unspecified: Secondary | ICD-10-CM | POA: Diagnosis not present

## 2016-04-19 DIAGNOSIS — F17200 Nicotine dependence, unspecified, uncomplicated: Secondary | ICD-10-CM | POA: Diagnosis not present

## 2016-04-19 DIAGNOSIS — J449 Chronic obstructive pulmonary disease, unspecified: Secondary | ICD-10-CM | POA: Diagnosis not present

## 2016-05-18 DIAGNOSIS — I10 Essential (primary) hypertension: Secondary | ICD-10-CM | POA: Diagnosis not present

## 2016-05-18 DIAGNOSIS — J449 Chronic obstructive pulmonary disease, unspecified: Secondary | ICD-10-CM | POA: Diagnosis not present

## 2016-05-18 DIAGNOSIS — Z7689 Persons encountering health services in other specified circumstances: Secondary | ICD-10-CM | POA: Diagnosis not present

## 2016-05-18 DIAGNOSIS — F17200 Nicotine dependence, unspecified, uncomplicated: Secondary | ICD-10-CM | POA: Diagnosis not present

## 2016-06-05 DIAGNOSIS — R279 Unspecified lack of coordination: Secondary | ICD-10-CM | POA: Diagnosis not present

## 2016-06-05 DIAGNOSIS — Z743 Need for continuous supervision: Secondary | ICD-10-CM | POA: Diagnosis not present

## 2016-06-11 DEATH — deceased
